# Patient Record
Sex: Male | Born: 1980 | Race: Black or African American | Hispanic: No | Marital: Married | Smoking: Never smoker
Health system: Southern US, Community
[De-identification: ages and names within clinical notes are randomized; demographics above are authoritative.]

---

## 2014-12-13 ENCOUNTER — Ambulatory Visit
Admit: 2014-12-13 | Disposition: A | Discharge status: Home / Self Care | Payer: Self-pay | Attending: Family Medicine | Admitting: Family Medicine

## 2014-12-13 LAB — COMPREHENSIVE METABOLIC PANEL
Albumin: 4.5 g/dL
Alkaline Phosphatase: 51 U/L
Anion Gap: 6 — ABNORMAL LOW (ref 7–16)
BILIRUBIN TOTAL: 0.5 mg/dL
BUN: 12 mg/dL
CO2: 31 mmol/L
Calcium, Total: 9.2 mg/dL
Chloride: 102 mmol/L
Creatinine: 0.96 mg/dL
EGFR (African American): >60
GLUCOSE: 97 mg/dL
POTASSIUM: 4 mmol/L
SGOT(AST): 19 U/L
SGPT (ALT): 32 U/L
Sodium: 139 mmol/L
TOTAL PROTEIN: 7.6 g/dL

## 2014-12-13 LAB — URINALYSIS, COMPLETE
BACTERIA: NEGATIVE
BLOOD: NEGATIVE
Bilirubin,UR: NEGATIVE
Glucose,UR: NEGATIVE
Ketone: NEGATIVE
NITRITE: NEGATIVE
Ph: 7.5 (ref 5.0–8.0)
RBC, UR: NONE SEEN /HPF (ref 0–5)
Specific Gravity: 1.02 (ref 1.000–1.030)

## 2014-12-13 LAB — CBC WITH DIFFERENTIAL/PLATELET
BASOS ABS: 0 10*3/uL (ref 0.0–0.1)
Basophil %: 0.7 %
Eosinophil #: 0.1 10*3/uL (ref 0.0–0.7)
Eosinophil %: 1.2 %
HCT: 45.1 % (ref 40.0–52.0)
HGB: 15.5 g/dL (ref 13.0–18.0)
LYMPHS ABS: 1.8 10*3/uL (ref 1.0–3.6)
Lymphocyte %: 28.5 %
MCH: 31.6 pg (ref 26.0–34.0)
MCHC: 34.3 g/dL (ref 32.0–36.0)
MCV: 92 fL (ref 80–100)
Monocyte #: 0.5 x10 3/mm (ref 0.2–1.0)
Monocyte %: 7.3 %
NEUTROS ABS: 3.9 10*3/uL (ref 1.4–6.5)
Neutrophil %: 62.3 %
PLATELETS: 186 10*3/uL (ref 150–440)
RBC: 4.89 10*6/uL (ref 4.40–5.90)
RDW: 13.1 % (ref 11.5–14.5)
WBC: 6.2 10*3/uL (ref 3.8–10.6)

## 2014-12-13 LAB — LIPASE, BLOOD: Lipase: 26 U/L

## 2014-12-13 LAB — AMYLASE: Amylase: 64 U/L

## 2015-12-09 ENCOUNTER — Encounter: Payer: Self-pay | Admitting: Emergency Medicine

## 2015-12-09 ENCOUNTER — Emergency Department
Admission: EM | Admit: 2015-12-09 | Discharge: 2015-12-09 | Disposition: A | Discharge status: Home / Self Care | Payer: Managed Care, Other (non HMO) | Attending: Emergency Medicine | Admitting: Emergency Medicine

## 2015-12-09 DIAGNOSIS — Y999 Unspecified external cause status: Secondary | ICD-10-CM | POA: Diagnosis not present

## 2015-12-09 DIAGNOSIS — Y929 Unspecified place or not applicable: Secondary | ICD-10-CM | POA: Insufficient documentation

## 2015-12-09 DIAGNOSIS — Y939 Activity, unspecified: Secondary | ICD-10-CM | POA: Diagnosis not present

## 2015-12-09 DIAGNOSIS — M62838 Other muscle spasm: Secondary | ICD-10-CM

## 2015-12-09 DIAGNOSIS — S39012A Strain of muscle, fascia and tendon of lower back, initial encounter: Secondary | ICD-10-CM | POA: Diagnosis not present

## 2015-12-09 DIAGNOSIS — S3992XA Unspecified injury of lower back, initial encounter: Secondary | ICD-10-CM | POA: Diagnosis present

## 2015-12-09 MED ORDER — MELOXICAM 15 MG PO TABS: 15.0000 mg | ORAL_TABLET | Freq: Every day | ORAL | Status: DC

## 2015-12-09 MED ORDER — METHOCARBAMOL 500 MG PO TABS: 500.0000 mg | ORAL_TABLET | Freq: Four times a day (QID) | ORAL | Status: DC
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
## 2015-12-09 NOTE — Discharge Instructions (Signed)

## 2017-02-09 ENCOUNTER — Emergency Department: Payer: Managed Care, Other (non HMO)

## 2017-02-09 ENCOUNTER — Encounter: Payer: Self-pay | Admitting: Emergency Medicine

## 2017-02-09 ENCOUNTER — Emergency Department
Admission: EM | Admit: 2017-02-09 | Discharge: 2017-02-09 | Disposition: A | Discharge status: Home / Self Care | Payer: Managed Care, Other (non HMO) | Attending: Emergency Medicine | Admitting: Emergency Medicine

## 2017-02-09 DIAGNOSIS — M7661 Achilles tendinitis, right leg: Secondary | ICD-10-CM | POA: Diagnosis not present

## 2017-02-09 DIAGNOSIS — Z79899 Other long term (current) drug therapy: Secondary | ICD-10-CM | POA: Diagnosis not present

## 2017-02-09 DIAGNOSIS — M79661 Pain in right lower leg: Secondary | ICD-10-CM

## 2017-02-09 DIAGNOSIS — M7989 Other specified soft tissue disorders: Secondary | ICD-10-CM

## 2017-02-09 DIAGNOSIS — M79662 Pain in left lower leg: Secondary | ICD-10-CM

## 2017-02-09 DIAGNOSIS — M79604 Pain in right leg: Secondary | ICD-10-CM | POA: Diagnosis present

## 2017-02-09 MED ORDER — KETOROLAC TROMETHAMINE 10 MG PO TABS: 10.0000 mg | ORAL_TABLET | Freq: Four times a day (QID) | ORAL | 0 refills | Status: DC | PRN

## 2017-02-09 NOTE — ED Provider Notes (Signed)
[facility] Emergency Department Provider Note  ____________________________________________  Time seen: Approximately 12:54 PM  I have reviewed the triage vital signs and the nursing notes.   HISTORY  Chief Complaint Leg Pain    HPI Tyrone Mckinney is a 36 y.o. male that presents to the emergency department with calf pain after an injury 2 weeks ago. Pain radiates from his ankle to his calf. He describes the pain as throbbing.  Pain was getting better but then worsened after he walked on it all day at work yesterday. This morning he thinks that he "re-tweaked" his ankle walking up the stairs. Patient was seen at an outside ED 2 weeks ago and had xrays completed and was told that he pulled something. He has been wearing a walking cast. He has taken tramadol and naproxen for pain. He denies fever, SOB, CP, nausea, vomiting, abdominal pain, numbness, tingling.    History reviewed. No pertinent past medical history.  There are no active problems to display for this patient.   No past surgical history on file.  Prior to Admission medications   Medication Sig Start Date End Date Taking? Authorizing Provider  ketorolac (TORADOL) 10 MG tablet Take 1 tablet (10 mg total) by mouth every 6 (six) hours as needed. 02/09/17   Enid Derry, PA-C  meloxicam (MOBIC) 15 MG tablet Take 1 tablet (15 mg total) by mouth daily. 12/09/15   Cuthriell, Delorise Royals, PA-C  methocarbamol (ROBAXIN) 500 MG tablet Take 1 tablet (500 mg total) by mouth 4 (four) times daily. 12/09/15   Cuthriell, Delorise Royals, PA-C    Allergies Patient has no known allergies.  No family history on file.  Social History Social History  Substance Use Topics  . Smoking status: Never Smoker  . Smokeless tobacco: Not on file  . Alcohol use No     Review of Systems  Constitutional: No fever/chills Cardiovascular: No chest pain. Respiratory: No SOB. Gastrointestinal: No abdominal pain.  No nausea,  no vomiting.  Musculoskeletal: Positive for lower right leg pain.  Skin: Negative for rash, abrasions, lacerations, ecchymosis. Neurological: Negative for headaches, numbness or tingling   ____________________________________________   PHYSICAL EXAM:  VITAL SIGNS: ED Triage Vitals  Enc Vitals Group     BP 02/09/17 1144 130/82     Pulse Rate 02/09/17 1144 69     Resp 02/09/17 1144 15     Temp 02/09/17 1144 98.2 F (36.8 C)     Temp Source 02/09/17 1144 Oral     SpO2 02/09/17 1144 97 %     Weight --      Height --      Head Circumference --      Peak Flow --      Pain Score 02/09/17 1132 8     Pain Loc --      Pain Edu? --      Excl. in GC? --      Constitutional: Alert and oriented. Well appearing and in no acute distress. Eyes: Conjunctivae are normal. PERRL. EOMI. Head: Atraumatic. ENT:      Ears:      Nose: No congestion/rhinnorhea.      Mouth/Throat: Mucous membranes are moist.  Neck: No stridor.  Cardiovascular: Normal rate, regular rhythm.  Good peripheral circulation. 2+ dorsalis pedis pulses. Respiratory: Normal respiratory effort without tachypnea or retractions. Lungs CTAB. Good air entry to the bases with no decreased or absent breath sounds. Gastrointestinal: Bowel sounds 4 quadrants. Soft and nontender to palpation.  No guarding or rigidity. No palpable masses. No distention.  Musculoskeletal: Full range of motion to all extremities. No gross deformities appreciated. Tenderness to palpation of right calf. Tenderness to palpation over her Achilles tendon. Negative Thompson test.  Neurologic:  Normal speech and language. No gross focal neurologic deficits are appreciated.  Skin:  Skin is warm, dry and intact. No rash noted. Psychiatric: Mood and affect are normal. Speech and behavior are normal.    ____________________________________________   LABS (all labs ordered are listed, but only abnormal results are displayed)  Labs Reviewed - No data to  display ____________________________________________  EKG   ____________________________________________  RADIOLOGY  [place] Venous Img Lower Unilateral Right  Result Date: 02/09/2017 CLINICAL DATA:  Right-sided pain EXAM: RIGHT LOWER EXTREMITY VENOUS DOPPLER ULTRASOUND TECHNIQUE: Gray-scale sonography with graded compression, as well as color Doppler and duplex ultrasound were performed to evaluate the lower extremity deep venous systems from the level of the common femoral vein and including the common femoral, femoral, profunda femoral, popliteal and calf veins including the posterior tibial, peroneal and gastrocnemius veins when visible. The superficial great saphenous vein was also interrogated. Spectral Doppler was utilized to evaluate flow at rest and with distal augmentation maneuvers in the common femoral, femoral and popliteal veins. COMPARISON:  None. FINDINGS: Contralateral Common Femoral Vein: Respiratory phasicity is normal and symmetric with the symptomatic side. No evidence of thrombus. Normal compressibility. Common Femoral Vein: No evidence of thrombus. Normal compressibility, respiratory phasicity and response to augmentation. Saphenofemoral Junction: No evidence of thrombus. Normal compressibility and flow on color Doppler imaging. Profunda Femoral Vein: No evidence of thrombus. Normal compressibility and flow on color Doppler imaging. Femoral Vein: No evidence of thrombus. Normal compressibility, respiratory phasicity and response to augmentation. Popliteal Vein: No evidence of thrombus. Normal compressibility, respiratory phasicity and response to augmentation. Calf Veins: No evidence of thrombus. Normal compressibility and flow on color Doppler imaging. Superficial Great Saphenous Vein: No evidence of thrombus. Normal compressibility and flow on color Doppler imaging. Venous Reflux:  None. Other Findings:  None. IMPRESSION: No evidence of DVT within the right lower extremity.  Electronically Signed   By: Gerome Samavid  Williams III M.D   On: 02/09/2017 14:11    ____________________________________________    PROCEDURES  Procedure(s) performed:    Procedures    Medications - No data to display   ____________________________________________   INITIAL IMPRESSION / ASSESSMENT AND PLAN / ED COURSE  Pertinent labs & imaging results that were available during my care of the patient were reviewed by me and considered in my medical decision making (see chart for details).  Review of the [place] CSRS was performed in accordance of the [facility] prior to dispensing any controlled drugs.   Patient presented to the emergency department with right calf and ankle pain for 2 weeks. Vital signs and exam are reassuring. Ultrasound negative for DVT. Patient had an x-ray completed at an outside ED 2 weeks ago, which was negative for any bony abnormalities. Pain was improving until he stepped on it different going up the steps today. He has tenderness to palpation over the Achilles tendon. Negative Thompson test. Patient will be discharged home with prescriptions for Toradol. Patient is to follow up with orthopedics as directed. Patient is given ED precautions to return to the ED for any worsening or new symptoms.     ____________________________________________  FINAL CLINICAL IMPRESSION(S) / ED DIAGNOSES  Final diagnoses:  Achilles tendinitis of right lower extremity      NEW MEDICATIONS STARTED  DURING THIS VISIT:  Discharge Medication List as of 02/09/2017  2:36 PM    START taking these medications   Details  ketorolac (TORADOL) 10 MG tablet Take 1 tablet (10 mg total) by mouth every 6 (six) hours as needed., Starting Thu 02/09/2017, Print            This chart was dictated using voice recognition software/Dragon. Despite best efforts to proofread, errors can occur which can change the meaning. Any change was purely unintentional.    Enid Derry, PA-C 02/09/17  1535    Don Perking, [place], MD 02/10/17 [phone]

## 2017-02-09 NOTE — ED Triage Notes (Signed)
Pt reports he was walking down the stairs today and twisted his right leg, reports posterior leg pain, pt ambulatory to desk with limp.

## 2017-03-21 ENCOUNTER — Encounter: Payer: Self-pay | Admitting: Podiatry

## 2017-03-21 ENCOUNTER — Ambulatory Visit (INDEPENDENT_AMBULATORY_CARE_PROVIDER_SITE_OTHER): Payer: Managed Care, Other (non HMO)

## 2017-03-21 ENCOUNTER — Ambulatory Visit (INDEPENDENT_AMBULATORY_CARE_PROVIDER_SITE_OTHER): Payer: Managed Care, Other (non HMO) | Admitting: Podiatry

## 2017-03-21 ENCOUNTER — Other Ambulatory Visit: Payer: Self-pay

## 2017-03-21 DIAGNOSIS — M722 Plantar fascial fibromatosis: Secondary | ICD-10-CM

## 2017-03-21 DIAGNOSIS — M79671 Pain in right foot: Secondary | ICD-10-CM | POA: Diagnosis not present

## 2017-03-21 MED ORDER — DICLOFENAC SODIUM 75 MG PO TBEC: 75.0000 mg | DELAYED_RELEASE_TABLET | Freq: Two times a day (BID) | ORAL | 1 refills | Status: DC

## 2017-03-21 NOTE — Progress Notes (Signed)
   Subjective:    Patient ID: Tyrone Mckinney, male    DOB: 11/28/1980, 36 y.o.   MRN: [id number]  HPI  Chief Complaint  Patient presents with  . Foot Pain    right, bottom and back of heel x 2 months. Pt stated "hurt foot playing basketball"        Review of Systems  All other systems reviewed and are negative.      Objective:   Physical Exam        Assessment & Plan:

## 2017-03-25 MED ORDER — BETAMETHASONE SOD PHOS & ACET 6 (3-3) MG/ML IJ SUSP: 3.0000 mg | Freq: Once | INTRAMUSCULAR | Status: DC

## 2017-03-25 NOTE — Progress Notes (Signed)
Patient ID: Tyrone Mckinney, male   DOB: 03/05/1981, 36 y.o.   MRN: [id number]   HPI: Patient presents today for evaluation of pain to the posterior and plantar aspect of the right heel. This is been going on for approximately 2 months. Patient states that he hurt his foot approximately 2 months ago while playing basketball. He came down hard on the heel and has experienced pain and tenderness ever since.   Physical Exam: General: The patient is alert and oriented x3 in no acute distress.  Dermatology: Skin is warm, dry and supple bilateral lower extremities. Negative for open lesions or macerations.  Vascular: Palpable pedal pulses bilaterally. No edema or erythema noted. Capillary refill within normal limits.  Neurological: Epicritic and protective threshold grossly intact bilaterally.   Musculoskeletal Exam: Range of motion within normal limits to all pedal and ankle joints bilateral. Muscle strength 5/5 in all groups bilateral. Negative pain for lateral compression of the posterior tubercle of the calcaneus. There is also some pain on palpation to the plantar heel consistent with a likely contributory plantar fasciitis  Radiographic Exam:  Normal osseous mineralization. Joint spaces preserved. No fracture/dislocation/boney destruction.    Assessment: 1. Right calcaneal bone contusion plantar heel 2. Plantar fasciitis right   Plan of Care:  1. Patient was evaluated. X-rays reviewed today 2. Injection of 0.5 mL Celestone Soluspan injected in the plantar aspect of the right heel 3. Plantar fascial brace dispensed today to offload heel pressure 4. Prescription for diclofenac 75 mg 5. A note for work was provided to refrain from work 4 weeks 6. Return to clinic in 4 weeks   Felecia ShellingBrent M. Daneisha Surges, DPM [facility]  Dr. Felecia ShellingBrent M. Cecelia Graciano, DPM    [address]                                        [place], [place] [id number]                Office [phone]  Fax [phone]  [phone]

## 2017-04-18 ENCOUNTER — Ambulatory Visit: Payer: Managed Care, Other (non HMO) | Admitting: Podiatry

## 2019-02-25 IMAGING — US US EXTREM LOW VENOUS*R*
1 series · 13 of 24 positions shown · non-contrast
Comparison: None.

CLINICAL DATA: Right-sided pain



[Series 1: us extrem low venous*right* · 0.07mm/px · 13 of 35 slices shown]
[im 1/35]
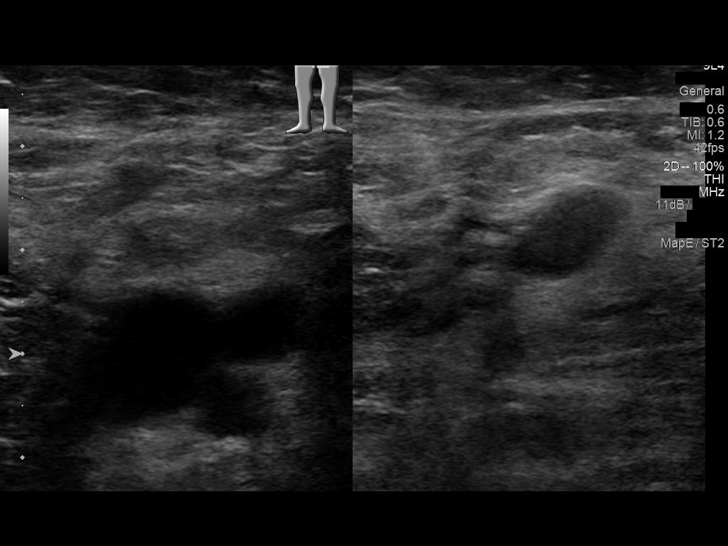
[im 3/35]
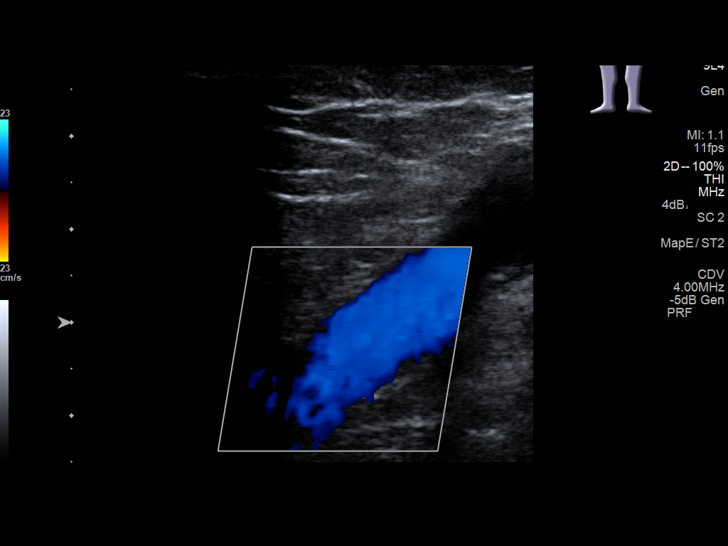
[im 6/35]
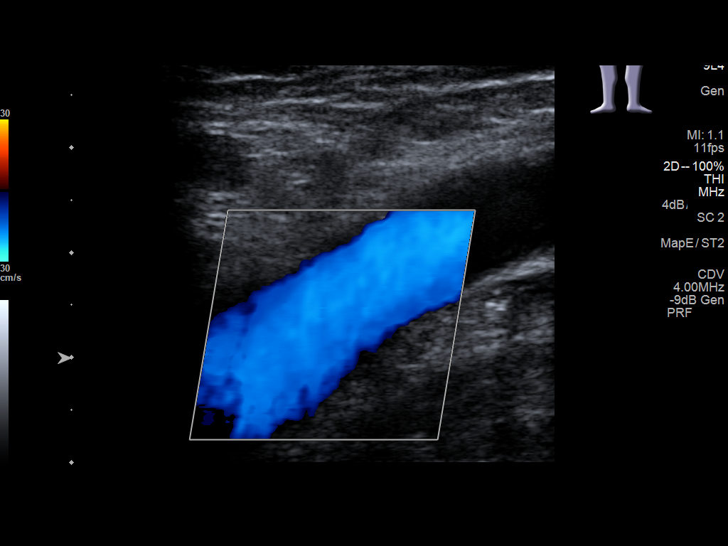
[im 9/35]
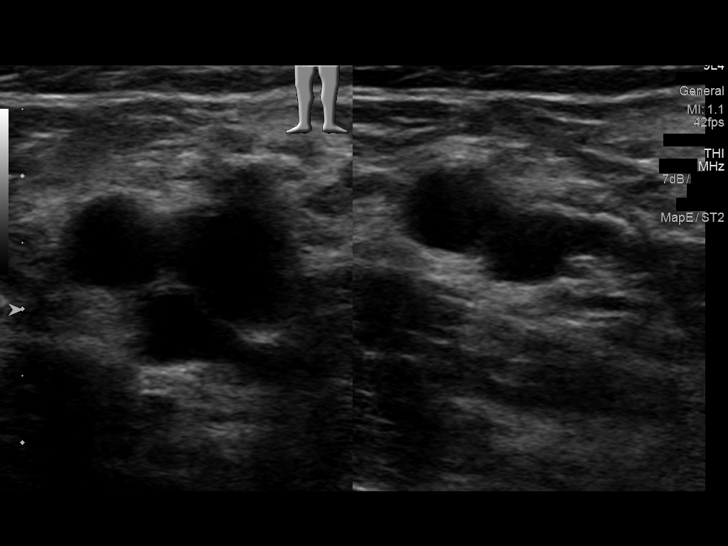
[im 12/35]
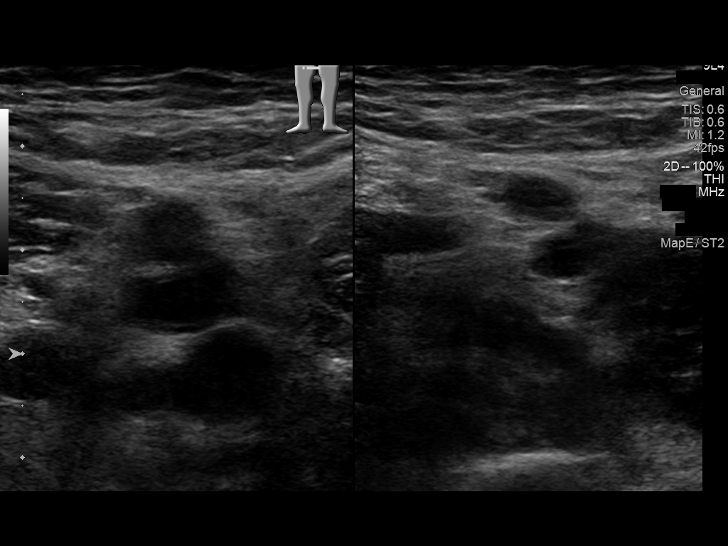
[im 15/35]
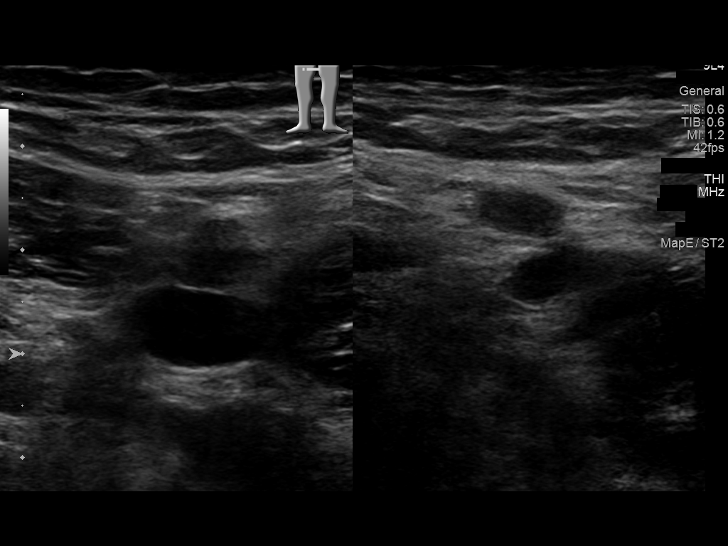
[im 18/35]
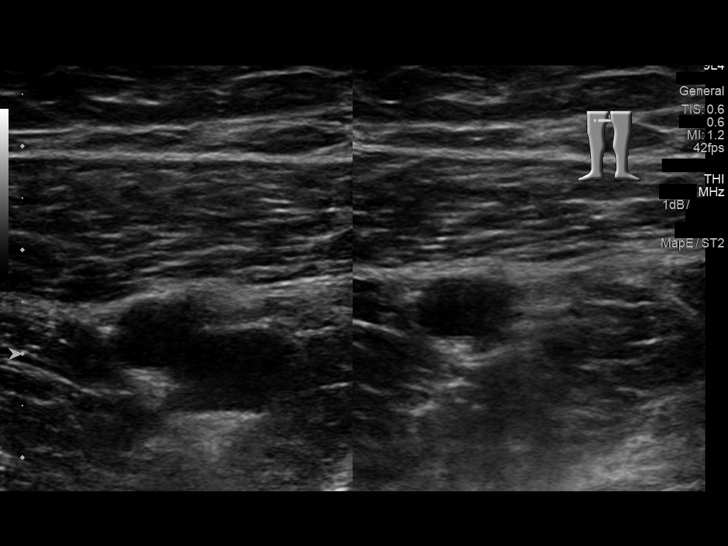
[im 20/35]
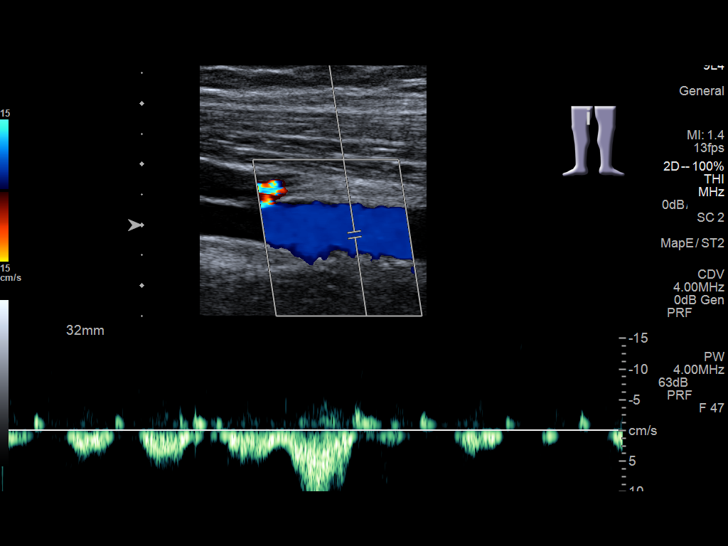
[im 23/35]
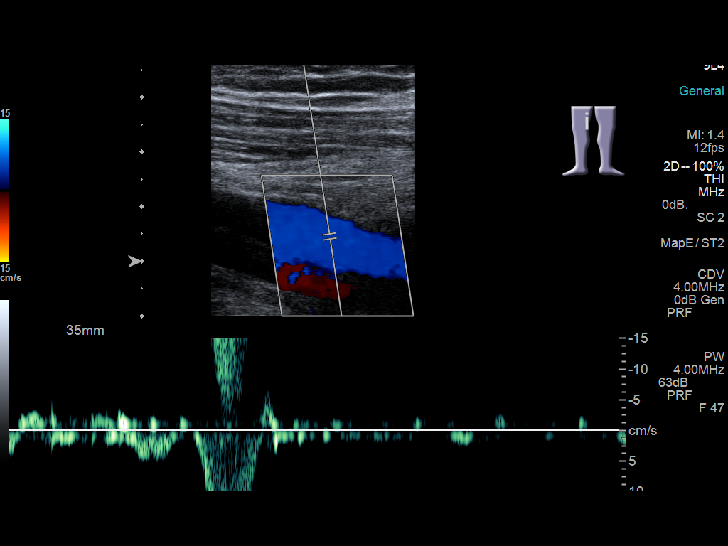
[im 26/35]
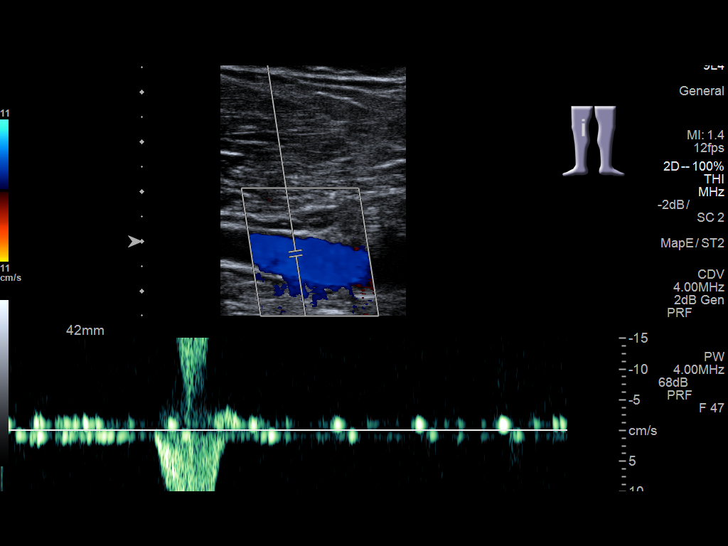
[im 29/35]
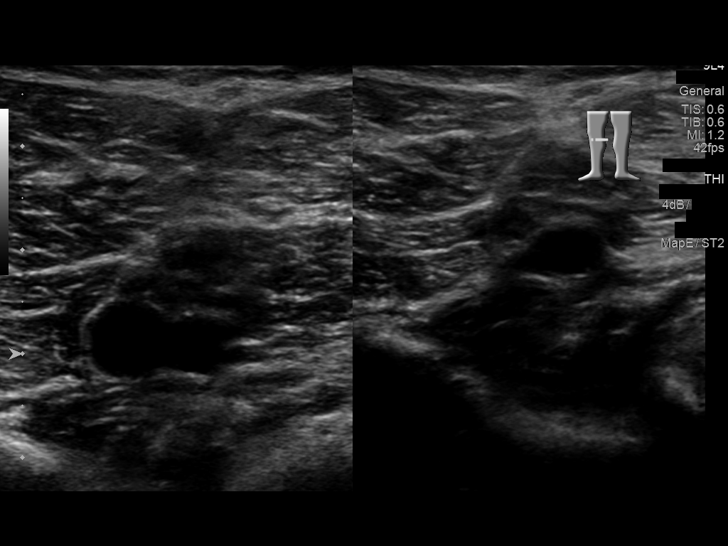
[im 32/35]
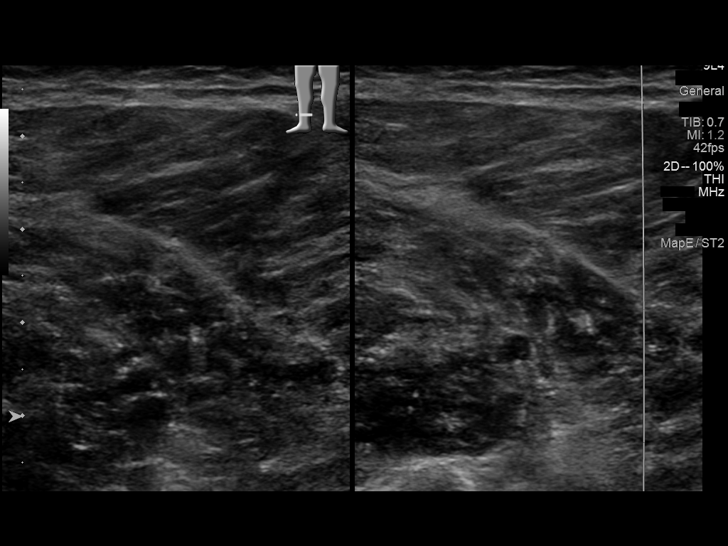
[im 35/35]
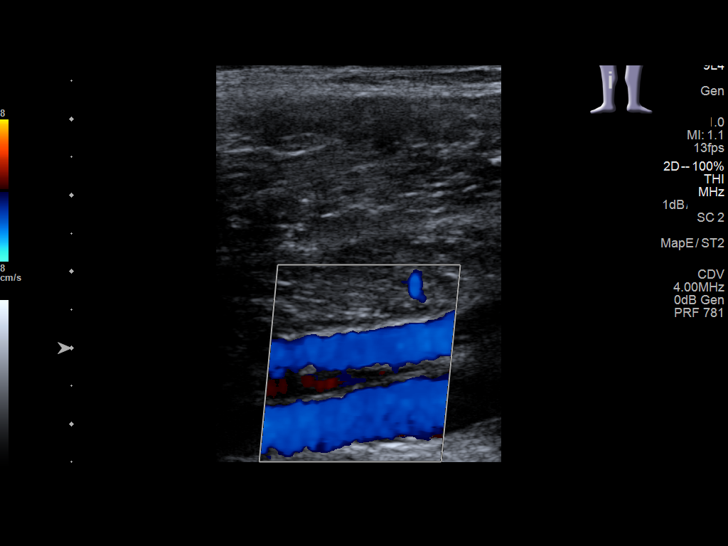

[13 of 24 positions shown; findings below may reference images not displayed]

FINDINGS: Contralateral Common Femoral Vein: Respiratory phasicity is normal
and symmetric with the symptomatic side. No evidence of thrombus.
Normal compressibility.

Common Femoral Vein: No evidence of thrombus. Normal
compressibility, respiratory phasicity and response to augmentation.

Saphenofemoral Junction: No evidence of thrombus. Normal
compressibility and flow on color Doppler imaging.

Profunda Femoral Vein: No evidence of thrombus. Normal
compressibility and flow on color Doppler imaging.

Femoral Vein: No evidence of thrombus. Normal compressibility,
respiratory phasicity and response to augmentation.

Popliteal Vein: No evidence of thrombus. Normal compressibility,
respiratory phasicity and response to augmentation.

Calf Veins: No evidence of thrombus. Normal compressibility and flow
on color Doppler imaging.

Superficial Great Saphenous Vein: No evidence of thrombus. Normal
compressibility and flow on color Doppler imaging.

Venous Reflux:  None.

Other Findings:  None.
IMPRESSION: No evidence of DVT within the right lower extremity.

## 2024-03-28 ENCOUNTER — Inpatient Hospital Stay (HOSPITAL_COMMUNITY): Payer: MEDICAID

## 2024-03-28 ENCOUNTER — Inpatient Hospital Stay (HOSPITAL_COMMUNITY): Payer: Self-pay

## 2024-03-28 ENCOUNTER — Other Ambulatory Visit: Payer: Self-pay

## 2024-03-28 ENCOUNTER — Emergency Department (HOSPITAL_COMMUNITY): Payer: Self-pay

## 2024-03-28 ENCOUNTER — Inpatient Hospital Stay (HOSPITAL_COMMUNITY): Payer: Self-pay | Admitting: Critical Care Medicine

## 2024-03-28 ENCOUNTER — Emergency Department (HOSPITAL_COMMUNITY): Payer: MEDICAID

## 2024-03-28 ENCOUNTER — Inpatient Hospital Stay (HOSPITAL_COMMUNITY)
Admission: EM | Admit: 2024-03-28 | Discharge: 2024-04-12 | DRG: 957 | Disposition: A | Discharge status: Rehabilitation Facility | Payer: MEDICAID | Attending: General Surgery | Admitting: General Surgery

## 2024-03-28 ENCOUNTER — Inpatient Hospital Stay (HOSPITAL_COMMUNITY): Admission: EM | Disposition: A | Discharge status: Rehabilitation Facility | Payer: Self-pay | Source: Home / Self Care

## 2024-03-28 DIAGNOSIS — S140XXS Concussion and edema of cervical spinal cord, sequela: Secondary | ICD-10-CM | POA: Diagnosis not present

## 2024-03-28 DIAGNOSIS — S14154A Other incomplete lesion at C4 level of cervical spinal cord, initial encounter: Principal | ICD-10-CM | POA: Diagnosis present

## 2024-03-28 DIAGNOSIS — S14104A Unspecified injury at C4 level of cervical spinal cord, initial encounter: Secondary | ICD-10-CM | POA: Diagnosis present

## 2024-03-28 DIAGNOSIS — T794XXA Traumatic shock, initial encounter: Secondary | ICD-10-CM | POA: Diagnosis not present

## 2024-03-28 DIAGNOSIS — N319 Neuromuscular dysfunction of bladder, unspecified: Secondary | ICD-10-CM | POA: Diagnosis present

## 2024-03-28 DIAGNOSIS — F430 Acute stress reaction: Secondary | ICD-10-CM | POA: Diagnosis not present

## 2024-03-28 DIAGNOSIS — G8250 Quadriplegia, unspecified: Secondary | ICD-10-CM | POA: Diagnosis present

## 2024-03-28 DIAGNOSIS — S22050A Wedge compression fracture of T5-T6 vertebra, initial encounter for closed fracture: Secondary | ICD-10-CM | POA: Diagnosis present

## 2024-03-28 DIAGNOSIS — J9690 Respiratory failure, unspecified, unspecified whether with hypoxia or hypercapnia: Secondary | ICD-10-CM | POA: Diagnosis present

## 2024-03-28 DIAGNOSIS — I4891 Unspecified atrial fibrillation: Secondary | ICD-10-CM | POA: Diagnosis present

## 2024-03-28 DIAGNOSIS — F1310 Sedative, hypnotic or anxiolytic abuse, uncomplicated: Secondary | ICD-10-CM | POA: Diagnosis present

## 2024-03-28 DIAGNOSIS — M4834 Traumatic spondylopathy, thoracic region: Secondary | ICD-10-CM | POA: Diagnosis present

## 2024-03-28 DIAGNOSIS — G9519 Other vascular myelopathies: Secondary | ICD-10-CM | POA: Diagnosis present

## 2024-03-28 DIAGNOSIS — S22070A Wedge compression fracture of T9-T10 vertebra, initial encounter for closed fracture: Secondary | ICD-10-CM | POA: Diagnosis present

## 2024-03-28 DIAGNOSIS — R451 Restlessness and agitation: Secondary | ICD-10-CM | POA: Diagnosis present

## 2024-03-28 DIAGNOSIS — D62 Acute posthemorrhagic anemia: Secondary | ICD-10-CM | POA: Diagnosis present

## 2024-03-28 DIAGNOSIS — Y92410 Unspecified street and highway as the place of occurrence of the external cause: Secondary | ICD-10-CM

## 2024-03-28 DIAGNOSIS — S140XXD Concussion and edema of cervical spinal cord, subsequent encounter: Secondary | ICD-10-CM | POA: Diagnosis not present

## 2024-03-28 DIAGNOSIS — F32A Depression, unspecified: Secondary | ICD-10-CM | POA: Diagnosis present

## 2024-03-28 DIAGNOSIS — S22060A Wedge compression fracture of T7-T8 vertebra, initial encounter for closed fracture: Secondary | ICD-10-CM | POA: Diagnosis present

## 2024-03-28 DIAGNOSIS — M4802 Spinal stenosis, cervical region: Secondary | ICD-10-CM | POA: Diagnosis present

## 2024-03-28 DIAGNOSIS — S0101XA Laceration without foreign body of scalp, initial encounter: Secondary | ICD-10-CM | POA: Diagnosis present

## 2024-03-28 DIAGNOSIS — S0682AA Injury of left internal carotid artery, intracranial portion, not elsewhere classified with loss of consciousness status unknown, initial encounter: Secondary | ICD-10-CM | POA: Diagnosis present

## 2024-03-28 DIAGNOSIS — S27322A Contusion of lung, bilateral, initial encounter: Secondary | ICD-10-CM | POA: Diagnosis present

## 2024-03-28 DIAGNOSIS — K592 Neurogenic bowel, not elsewhere classified: Secondary | ICD-10-CM | POA: Diagnosis present

## 2024-03-28 DIAGNOSIS — S52612A Displaced fracture of left ulna styloid process, initial encounter for closed fracture: Secondary | ICD-10-CM | POA: Diagnosis present

## 2024-03-28 DIAGNOSIS — M5021 Other cervical disc displacement,  high cervical region: Secondary | ICD-10-CM | POA: Diagnosis present

## 2024-03-28 DIAGNOSIS — S27331A Laceration of lung, unilateral, initial encounter: Secondary | ICD-10-CM | POA: Diagnosis present

## 2024-03-28 DIAGNOSIS — Z79899 Other long term (current) drug therapy: Secondary | ICD-10-CM

## 2024-03-28 DIAGNOSIS — N4830 Priapism, unspecified: Secondary | ICD-10-CM | POA: Diagnosis present

## 2024-03-28 DIAGNOSIS — T07XXXA Unspecified multiple injuries, initial encounter: Secondary | ICD-10-CM | POA: Diagnosis present

## 2024-03-28 DIAGNOSIS — M4801 Spinal stenosis, occipito-atlanto-axial region: Secondary | ICD-10-CM | POA: Diagnosis present

## 2024-03-28 DIAGNOSIS — K30 Functional dyspepsia: Secondary | ICD-10-CM | POA: Diagnosis not present

## 2024-03-28 DIAGNOSIS — F1210 Cannabis abuse, uncomplicated: Secondary | ICD-10-CM | POA: Diagnosis present

## 2024-03-28 DIAGNOSIS — R9389 Abnormal findings on diagnostic imaging of other specified body structures: Secondary | ICD-10-CM

## 2024-03-28 DIAGNOSIS — F1410 Cocaine abuse, uncomplicated: Secondary | ICD-10-CM | POA: Diagnosis present

## 2024-03-28 DIAGNOSIS — Z7982 Long term (current) use of aspirin: Secondary | ICD-10-CM

## 2024-03-28 DIAGNOSIS — I493 Ventricular premature depolarization: Secondary | ICD-10-CM | POA: Diagnosis present

## 2024-03-28 DIAGNOSIS — Z560 Unemployment, unspecified: Secondary | ICD-10-CM

## 2024-03-28 DIAGNOSIS — S0681AA Injury of right internal carotid artery, intracranial portion, not elsewhere classified with loss of consciousness status unknown, initial encounter: Secondary | ICD-10-CM | POA: Diagnosis present

## 2024-03-28 DIAGNOSIS — Z5971 Insufficient health insurance coverage: Secondary | ICD-10-CM

## 2024-03-28 DIAGNOSIS — I951 Orthostatic hypotension: Secondary | ICD-10-CM | POA: Diagnosis not present

## 2024-03-28 DIAGNOSIS — Z23 Encounter for immunization: Secondary | ICD-10-CM | POA: Diagnosis not present

## 2024-03-28 DIAGNOSIS — S61532A Puncture wound without foreign body of left wrist, initial encounter: Secondary | ICD-10-CM | POA: Diagnosis present

## 2024-03-28 DIAGNOSIS — E882 Lipomatosis, not elsewhere classified: Secondary | ICD-10-CM | POA: Diagnosis present

## 2024-03-28 DIAGNOSIS — I358 Other nonrheumatic aortic valve disorders: Secondary | ICD-10-CM | POA: Diagnosis present

## 2024-03-28 DIAGNOSIS — I9581 Postprocedural hypotension: Secondary | ICD-10-CM | POA: Diagnosis not present

## 2024-03-28 DIAGNOSIS — M4804 Spinal stenosis, thoracic region: Secondary | ICD-10-CM | POA: Diagnosis present

## 2024-03-28 DIAGNOSIS — S0181XA Laceration without foreign body of other part of head, initial encounter: Secondary | ICD-10-CM | POA: Diagnosis present

## 2024-03-28 DIAGNOSIS — R402412 Glasgow coma scale score 13-15, at arrival to emergency department: Secondary | ICD-10-CM | POA: Diagnosis present

## 2024-03-28 DIAGNOSIS — Z818 Family history of other mental and behavioral disorders: Secondary | ICD-10-CM

## 2024-03-28 DIAGNOSIS — M50222 Other cervical disc displacement at C5-C6 level: Secondary | ICD-10-CM | POA: Diagnosis present

## 2024-03-28 HISTORY — PX: POSTERIOR CERVICAL FUSION/FORAMINOTOMY: SHX5038

## 2024-03-28 HISTORY — PX: SCALP LACERATION REPAIR: SHX6089

## 2024-03-28 LAB — TYPE AND SCREEN
ABO/RH(D): A POS
Antibody Screen: NEGATIVE

## 2024-03-28 LAB — I-STAT CHEM 8, ED
BUN: 17 mg/dL (ref 6–20)
Calcium, Ion: 1.04 mmol/L — ABNORMAL LOW (ref 1.15–1.40)
Chloride: 105 mmol/L (ref 98–111)
Creatinine, Ser: 0.9 mg/dL (ref 0.61–1.24)
Glucose, Bld: 119 mg/dL — ABNORMAL HIGH (ref 70–99)
HCT: 39 % (ref 39.0–52.0)
Hemoglobin: 13.3 g/dL (ref 13.0–17.0)
Potassium: 3.8 mmol/L (ref 3.5–5.1)
Sodium: 139 mmol/L (ref 135–145)
TCO2: 24 mmol/L (ref 22–32)

## 2024-03-28 LAB — ABO/RH: ABO/RH(D): A POS

## 2024-03-28 LAB — URINALYSIS, ROUTINE W REFLEX MICROSCOPIC
Bilirubin Urine: NEGATIVE
Bilirubin Urine: NEGATIVE
Glucose, UA: NEGATIVE mg/dL
Glucose, UA: 50 mg/dL — AB
Hgb urine dipstick: NEGATIVE
Ketones, ur: NEGATIVE mg/dL
Ketones, ur: NEGATIVE mg/dL
Leukocytes,Ua: NEGATIVE
Nitrite: NEGATIVE
Nitrite: NEGATIVE
Protein, ur: 30 mg/dL — AB
Protein, ur: NEGATIVE mg/dL
Specific Gravity, Urine: >1.046 — ABNORMAL HIGH (ref 1.005–1.030)
Specific Gravity, Urine: 1.013 (ref 1.005–1.030)
pH: 5 (ref 5.0–8.0)
pH: 5 (ref 5.0–8.0)

## 2024-03-28 LAB — BASIC METABOLIC PANEL WITH GFR
Anion gap: 7 (ref 5–15)
BUN: 13 mg/dL (ref 6–20)
CO2: 23 mmol/L (ref 22–32)
Calcium: 7.8 mg/dL — ABNORMAL LOW (ref 8.9–10.3)
Chloride: 109 mmol/L (ref 98–111)
Creatinine, Ser: 0.95 mg/dL (ref 0.61–1.24)
GFR, Estimated: >60 mL/min (ref >60)
Glucose, Bld: 145 mg/dL — ABNORMAL HIGH (ref 70–99)
Potassium: 4.2 mmol/L (ref 3.5–5.1)
Sodium: 139 mmol/L (ref 135–145)

## 2024-03-28 LAB — CBC
HCT: 39.7 % (ref 39.0–52.0)
HCT: 34 % — ABNORMAL LOW (ref 39.0–52.0)
HCT: 32.6 % — ABNORMAL LOW (ref 39.0–52.0)
Hemoglobin: 13.7 g/dL (ref 13.0–17.0)
Hemoglobin: 12.1 g/dL — ABNORMAL LOW (ref 13.0–17.0)
Hemoglobin: 11.3 g/dL — ABNORMAL LOW (ref 13.0–17.0)
MCH: 31.9 pg (ref 26.0–34.0)
MCH: 32.4 pg (ref 26.0–34.0)
MCH: 32.3 pg (ref 26.0–34.0)
MCHC: 34.5 g/dL (ref 30.0–36.0)
MCHC: 35.6 g/dL (ref 30.0–36.0)
MCHC: 34.7 g/dL (ref 30.0–36.0)
MCV: 92.5 fL (ref 80.0–100.0)
MCV: 91.2 fL (ref 80.0–100.0)
MCV: 93.1 fL (ref 80.0–100.0)
Platelets: 201 K/uL (ref 150–400)
Platelets: 201 K/uL (ref 150–400)
Platelets: 197 K/uL (ref 150–400)
RBC: 4.29 MIL/uL (ref 4.22–5.81)
RBC: 3.73 MIL/uL — ABNORMAL LOW (ref 4.22–5.81)
RBC: 3.5 MIL/uL — ABNORMAL LOW (ref 4.22–5.81)
RDW: 13.2 % (ref 11.5–15.5)
RDW: 13 % (ref 11.5–15.5)
RDW: 13.1 % (ref 11.5–15.5)
WBC: 7 K/uL (ref 4.0–10.5)
WBC: 10.7 K/uL — ABNORMAL HIGH (ref 4.0–10.5)
WBC: 13.1 K/uL — ABNORMAL HIGH (ref 4.0–10.5)
nRBC: 0 % (ref 0.0–0.2)
nRBC: 0 % (ref 0.0–0.2)
nRBC: 0 % (ref 0.0–0.2)

## 2024-03-28 LAB — COMPREHENSIVE METABOLIC PANEL WITH GFR
ALT: 33 U/L (ref 0–44)
AST: 36 U/L (ref 15–41)
Albumin: 3.5 g/dL (ref 3.5–5.0)
Alkaline Phosphatase: 46 U/L (ref 38–126)
Anion gap: 11 (ref 5–15)
BUN: 14 mg/dL (ref 6–20)
CO2: 22 mmol/L (ref 22–32)
Calcium: 8.6 mg/dL — ABNORMAL LOW (ref 8.9–10.3)
Chloride: 104 mmol/L (ref 98–111)
Creatinine, Ser: 0.97 mg/dL (ref 0.61–1.24)
GFR, Estimated: >60 mL/min (ref >60)
Glucose, Bld: 117 mg/dL — ABNORMAL HIGH (ref 70–99)
Potassium: 3.7 mmol/L (ref 3.5–5.1)
Sodium: 137 mmol/L (ref 135–145)
Total Bilirubin: 0.6 mg/dL (ref 0.0–1.2)
Total Protein: 6.1 g/dL — ABNORMAL LOW (ref 6.5–8.1)

## 2024-03-28 LAB — PROTIME-INR
INR: 0.9 (ref 0.8–1.2)
Prothrombin Time: 13.1 s (ref 11.4–15.2)

## 2024-03-28 LAB — TROPONIN I (HIGH SENSITIVITY)
Troponin I (High Sensitivity): 5 ng/L (ref <18)
Troponin I (High Sensitivity): 4 ng/L (ref <18)

## 2024-03-28 LAB — POCT I-STAT 7, (LYTES, BLD GAS, ICA,H+H)
Acid-base deficit: 3 mmol/L — ABNORMAL HIGH (ref 0.0–2.0)
Bicarbonate: 23.3 mmol/L (ref 20.0–28.0)
Calcium, Ion: 1.15 mmol/L (ref 1.15–1.40)
HCT: 31 % — ABNORMAL LOW (ref 39.0–52.0)
Hemoglobin: 10.5 g/dL — ABNORMAL LOW (ref 13.0–17.0)
O2 Saturation: 100 %
Potassium: 4.6 mmol/L (ref 3.5–5.1)
Sodium: 144 mmol/L (ref 135–145)
TCO2: 25 mmol/L (ref 22–32)
pCO2 arterial: 45.7 mmHg (ref 32–48)
pH, Arterial: 7.315 — ABNORMAL LOW (ref 7.35–7.45)
pO2, Arterial: 289 mmHg — ABNORMAL HIGH (ref 83–108)

## 2024-03-28 LAB — RAPID URINE DRUG SCREEN, HOSP PERFORMED
Amphetamines: NOT DETECTED
Barbiturates: NOT DETECTED
Benzodiazepines: POSITIVE — AB
Cocaine: POSITIVE — AB
Opiates: NOT DETECTED
Tetrahydrocannabinol: POSITIVE — AB

## 2024-03-28 LAB — I-STAT ARTERIAL BLOOD GAS, ED
Acid-base deficit: 2 mmol/L (ref 0.0–2.0)
Bicarbonate: 23.9 mmol/L (ref 20.0–28.0)
Calcium, Ion: 1.2 mmol/L (ref 1.15–1.40)
HCT: 30 % — ABNORMAL LOW (ref 39.0–52.0)
Hemoglobin: 10.2 g/dL — ABNORMAL LOW (ref 13.0–17.0)
O2 Saturation: 100 %
Patient temperature: 98.6
Potassium: 3.9 mmol/L (ref 3.5–5.1)
Sodium: 140 mmol/L (ref 135–145)
TCO2: 25 mmol/L (ref 22–32)
pCO2 arterial: 43 mmHg (ref 32–48)
pH, Arterial: 7.353 (ref 7.35–7.45)
pO2, Arterial: 306 mmHg — ABNORMAL HIGH (ref 83–108)

## 2024-03-28 LAB — I-STAT CG4 LACTIC ACID, ED: Lactic Acid, Venous: 1.6 mmol/L (ref 0.5–1.9)

## 2024-03-28 LAB — MAGNESIUM: Magnesium: 1.6 mg/dL — ABNORMAL LOW (ref 1.7–2.4)

## 2024-03-28 LAB — SAMPLE TO BLOOD BANK

## 2024-03-28 LAB — ETHANOL: Alcohol, Ethyl (B): <15 mg/dL (ref <15)

## 2024-03-28 LAB — PHOSPHORUS: Phosphorus: 3.3 mg/dL (ref 2.5–4.6)

## 2024-03-28 LAB — HIV ANTIBODY (ROUTINE TESTING W REFLEX): HIV Screen 4th Generation wRfx: NONREACTIVE

## 2024-03-28 SURGERY — POSTERIOR CERVICAL FUSION/FORAMINOTOMY LEVEL 5
Anesthesia: General

## 2024-03-28 MED ORDER — SUCCINYLCHOLINE CHLORIDE 20 MG/ML IJ SOLN
INTRAMUSCULAR | Status: AC | PRN
  Administered 2024-03-28: 120 mg via INTRAVENOUS

## 2024-03-28 MED ORDER — OXYCODONE HCL 5 MG PO TABS
5.0000 mg | ORAL_TABLET | ORAL | Status: DC
  Administered 2024-03-28: 5 mg
  Filled 2024-03-28: qty 1

## 2024-03-28 MED ORDER — GABAPENTIN 250 MG/5ML PO SOLN
300.0000 mg | Freq: Three times a day (TID) | ORAL | Status: DC
  Administered 2024-03-28 – 2024-03-30 (×7): 300 mg
  Filled 2024-03-28 (×10): qty 6

## 2024-03-28 MED ORDER — MIDAZOLAM HCL 2 MG/2ML IJ SOLN
INTRAMUSCULAR | Status: AC
  Filled 2024-03-28: qty 2

## 2024-03-28 MED ORDER — DEXMEDETOMIDINE HCL IN NACL 400 MCG/100ML IV SOLN: 0.0000 ug/kg/h | INTRAVENOUS | Status: DC

## 2024-03-28 MED ORDER — ONDANSETRON HCL 4 MG/2ML IJ SOLN
INTRAMUSCULAR | Status: DC | PRN
  Administered 2024-03-28: 4 mg via INTRAVENOUS

## 2024-03-28 MED ORDER — SODIUM CHLORIDE (PF) 0.9 % IJ SOLN
INTRAMUSCULAR | Status: AC
Start: 2024-03-28 — End: 2024-03-28
  Filled 2024-03-28: qty 10

## 2024-03-28 MED ORDER — DEXMEDETOMIDINE HCL IN NACL 400 MCG/100ML IV SOLN
0.0000 ug/kg/h | INTRAVENOUS | Status: DC
  Administered 2024-03-28: 0.4 ug/kg/h via INTRAVENOUS
  Administered 2024-03-29: 0.7 ug/kg/h via INTRAVENOUS
  Administered 2024-03-29: 0.5 ug/kg/h via INTRAVENOUS
  Filled 2024-03-28 (×2): qty 100

## 2024-03-28 MED ORDER — ETOMIDATE 2 MG/ML IV SOLN
INTRAVENOUS | Status: AC
  Administered 2024-03-28: 10 mg
  Filled 2024-03-28: qty 20

## 2024-03-28 MED ORDER — THROMBIN 5000 UNITS EX KIT
PACK | CUTANEOUS | Status: AC
  Filled 2024-03-28: qty 1

## 2024-03-28 MED ORDER — KETAMINE BOLUS VIA INFUSION: 0.3500 mg/kg | Freq: Four times a day (QID) | INTRAVENOUS | Status: DC | PRN

## 2024-03-28 MED ORDER — PHENYLEPHRINE 80 MCG/ML (10ML) SYRINGE FOR IV PUSH (FOR BLOOD PRESSURE SUPPORT)
PREFILLED_SYRINGE | INTRAVENOUS | Status: DC | PRN
  Administered 2024-03-28: 80 ug via INTRAVENOUS
  Administered 2024-03-28 (×2): 120 ug via INTRAVENOUS
  Administered 2024-03-28 (×3): 80 ug via INTRAVENOUS
  Administered 2024-03-28 (×2): 120 ug via INTRAVENOUS
  Administered 2024-03-28: 160 ug via INTRAVENOUS
  Administered 2024-03-28: 120 ug via INTRAVENOUS
  Administered 2024-03-28 (×2): 80 ug via INTRAVENOUS
  Administered 2024-03-28: 160 ug via INTRAVENOUS

## 2024-03-28 MED ORDER — ONDANSETRON HCL 4 MG/2ML IJ SOLN
INTRAMUSCULAR | Status: AC
  Filled 2024-03-28: qty 2

## 2024-03-28 MED ORDER — LACTATED RINGERS IV SOLN: INTRAVENOUS | Status: DC | PRN

## 2024-03-28 MED ORDER — POTASSIUM CHLORIDE 10 MEQ/100ML IV SOLN
10.0000 meq | INTRAVENOUS | Status: AC
  Administered 2024-03-28 (×2): 10 meq via INTRAVENOUS
  Filled 2024-03-28 (×4): qty 100

## 2024-03-28 MED ORDER — FENTANYL CITRATE (PF) 250 MCG/5ML IJ SOLN
INTRAMUSCULAR | Status: AC
  Filled 2024-03-28: qty 5

## 2024-03-28 MED ORDER — ORAL CARE MOUTH RINSE: 15.0000 mL | OROMUCOSAL | Status: DC | PRN

## 2024-03-28 MED ORDER — ONDANSETRON 4 MG PO TBDP
4.0000 mg | ORAL_TABLET | Freq: Four times a day (QID) | ORAL | Status: DC | PRN
Start: 2024-03-28 — End: 2024-04-12

## 2024-03-28 MED ORDER — DEXAMETHASONE SODIUM PHOSPHATE 10 MG/ML IJ SOLN
INTRAMUSCULAR | Status: DC | PRN
  Administered 2024-03-28: 10 mg via INTRAVENOUS

## 2024-03-28 MED ORDER — METHOCARBAMOL 1000 MG/10ML IJ SOLN
500.0000 mg | Freq: Three times a day (TID) | INTRAMUSCULAR | Status: AC
  Administered 2024-03-28 – 2024-03-29 (×2): 500 mg via INTRAVENOUS
  Filled 2024-03-28 (×2): qty 10

## 2024-03-28 MED ORDER — DOCUSATE SODIUM 50 MG/5ML PO LIQD
100.0000 mg | Freq: Two times a day (BID) | ORAL | Status: DC
  Administered 2024-03-28 – 2024-03-29 (×3): 100 mg
  Filled 2024-03-28 (×3): qty 10

## 2024-03-28 MED ORDER — BACITRACIN ZINC 500 UNIT/GM EX OINT
TOPICAL_OINTMENT | CUTANEOUS | Status: AC
Start: 2024-03-28 — End: 2024-03-28
  Filled 2024-03-28: qty 28.35

## 2024-03-28 MED ORDER — BUPIVACAINE-EPINEPHRINE (PF) 0.25% -1:200000 IJ SOLN
INTRAMUSCULAR | Status: AC
  Filled 2024-03-28: qty 30

## 2024-03-28 MED ORDER — BACITRACIN ZINC 500 UNIT/GM EX OINT
TOPICAL_OINTMENT | CUTANEOUS | Status: DC | PRN
  Administered 2024-03-28 (×2): 1 via TOPICAL

## 2024-03-28 MED ORDER — PROPOFOL 10 MG/ML IV BOLUS
INTRAVENOUS | Status: DC | PRN
  Administered 2024-03-28: 100 mg via INTRAVENOUS

## 2024-03-28 MED ORDER — IOHEXOL 350 MG/ML SOLN
75.0000 mL | Freq: Once | INTRAVENOUS | Status: AC | PRN
  Administered 2024-03-28: 75 mL via INTRAVENOUS

## 2024-03-28 MED ORDER — SODIUM CHLORIDE 0.9% FLUSH: 10.0000 mL | INTRAVENOUS | Status: DC | PRN

## 2024-03-28 MED ORDER — TETANUS-DIPHTH-ACELL PERTUSSIS 5-2.5-18.5 LF-MCG/0.5 IM SUSY
0.5000 mL | PREFILLED_SYRINGE | Freq: Once | INTRAMUSCULAR | Status: AC
  Administered 2024-03-28: 0.5 mL via INTRAMUSCULAR
  Filled 2024-03-28: qty 0.5

## 2024-03-28 MED ORDER — DEXMEDETOMIDINE HCL IN NACL 400 MCG/100ML IV SOLN
INTRAVENOUS | Status: AC
  Filled 2024-03-28: qty 100

## 2024-03-28 MED ORDER — ALBUMIN HUMAN 5 % IV SOLN: INTRAVENOUS | Status: DC | PRN

## 2024-03-28 MED ORDER — FENTANYL CITRATE PF 50 MCG/ML IJ SOSY: 25.0000 ug | PREFILLED_SYRINGE | Freq: Once | INTRAMUSCULAR | Status: DC

## 2024-03-28 MED ORDER — KETAMINE HCL-SODIUM CHLORIDE 1000-0.69 MG/100ML-% IV SOLN
0.5000 mg/kg/h | INTRAVENOUS | Status: DC
  Filled 2024-03-28: qty 100

## 2024-03-28 MED ORDER — ETOMIDATE 2 MG/ML IV SOLN
INTRAVENOUS | Status: DC | PRN
  Administered 2024-03-28: 10 mg via INTRAVENOUS

## 2024-03-28 MED ORDER — METHOCARBAMOL 500 MG PO TABS
500.0000 mg | ORAL_TABLET | Freq: Three times a day (TID) | ORAL | Status: AC
  Administered 2024-03-28 – 2024-03-30 (×5): 500 mg
  Filled 2024-03-28 (×7): qty 1

## 2024-03-28 MED ORDER — ETOMIDATE 2 MG/ML IV SOLN
INTRAVENOUS | Status: AC | PRN
  Administered 2024-03-28: 20 mg via INTRAVENOUS

## 2024-03-28 MED ORDER — SUCCINYLCHOLINE CHLORIDE 200 MG/10ML IV SOSY
PREFILLED_SYRINGE | INTRAVENOUS | Status: DC | PRN
  Administered 2024-03-28: 100 mg via INTRAVENOUS

## 2024-03-28 MED ORDER — FENTANYL 2500MCG IN NS 250ML (10MCG/ML) PREMIX INFUSION
0.0000 ug/h | INTRAVENOUS | Status: DC
  Administered 2024-03-28 – 2024-03-29 (×2): 200 ug/h via INTRAVENOUS
  Filled 2024-03-28 (×2): qty 250

## 2024-03-28 MED ORDER — PROPOFOL 1000 MG/100ML IV EMUL
0.0000 ug/kg/min | INTRAVENOUS | Status: DC
  Filled 2024-03-28: qty 100

## 2024-03-28 MED ORDER — NOREPINEPHRINE 4 MG/250ML-% IV SOLN: 0.0000 ug/min | INTRAVENOUS | Status: DC

## 2024-03-28 MED ORDER — FENTANYL CITRATE (PF) 250 MCG/5ML IJ SOLN
INTRAMUSCULAR | Status: DC | PRN
  Administered 2024-03-28 (×2): 125 ug via INTRAVENOUS

## 2024-03-28 MED ORDER — HYDRALAZINE HCL 20 MG/ML IJ SOLN: 10.0000 mg | INTRAMUSCULAR | Status: DC | PRN

## 2024-03-28 MED ORDER — FENTANYL 2500MCG IN NS 250ML (10MCG/ML) PREMIX INFUSION: 0.0000 ug/h | INTRAVENOUS | Status: DC

## 2024-03-28 MED ORDER — SODIUM CHLORIDE 0.9 % IR SOLN
Status: DC | PRN
Start: 2024-03-28 — End: 2024-03-28
  Administered 2024-03-28: 1000 mL

## 2024-03-28 MED ORDER — 0.9 % SODIUM CHLORIDE (POUR BTL) OPTIME
TOPICAL | Status: DC | PRN
  Administered 2024-03-28: 1000 mL

## 2024-03-28 MED ORDER — VASOPRESSIN 20 UNIT/ML IV SOLN
INTRAVENOUS | Status: AC
Start: 2024-03-28 — End: 2024-03-28
  Filled 2024-03-28: qty 1

## 2024-03-28 MED ORDER — MIDAZOLAM-SODIUM CHLORIDE 100-0.9 MG/100ML-% IV SOLN
0.5000 mg/h | INTRAVENOUS | Status: DC
  Administered 2024-03-28: 0.5 mg/h via INTRAVENOUS
  Administered 2024-03-28: 8 mg/h via INTRAVENOUS
  Filled 2024-03-28 (×2): qty 100

## 2024-03-28 MED ORDER — LACTATED RINGERS IV SOLN
INTRAVENOUS | Status: AC | PRN
  Administered 2024-03-28: 999 mL via INTRAVENOUS
  Administered 2024-03-28: 999 mL/h via INTRAVENOUS

## 2024-03-28 MED ORDER — ACETAMINOPHEN 500 MG PO TABS
1000.0000 mg | ORAL_TABLET | Freq: Four times a day (QID) | ORAL | Status: DC
  Administered 2024-03-28 – 2024-03-30 (×8): 1000 mg
  Filled 2024-03-28 (×9): qty 2

## 2024-03-28 MED ORDER — FENTANYL CITRATE PF 50 MCG/ML IJ SOSY
PREFILLED_SYRINGE | INTRAMUSCULAR | Status: AC
  Filled 2024-03-28: qty 2

## 2024-03-28 MED ORDER — ROCURONIUM BROMIDE 10 MG/ML (PF) SYRINGE
PREFILLED_SYRINGE | INTRAVENOUS | Status: AC
  Filled 2024-03-28: qty 10

## 2024-03-28 MED ORDER — PROPOFOL 500 MG/50ML IV EMUL
INTRAVENOUS | Status: DC | PRN
  Administered 2024-03-28: 50 ug/kg/min via INTRAVENOUS

## 2024-03-28 MED ORDER — HEMOSTATIC AGENTS (NO CHARGE) OPTIME
TOPICAL | Status: DC | PRN
  Administered 2024-03-28: 1 via TOPICAL

## 2024-03-28 MED ORDER — EPHEDRINE 5 MG/ML INJ
INTRAVENOUS | Status: AC
Start: 2024-03-28 — End: 2024-03-28
  Filled 2024-03-28: qty 5

## 2024-03-28 MED ORDER — ENOXAPARIN SODIUM 30 MG/0.3ML IJ SOSY: 30.0000 mg | PREFILLED_SYRINGE | Freq: Two times a day (BID) | INTRAMUSCULAR | Status: DC

## 2024-03-28 MED ORDER — PROPOFOL 1000 MG/100ML IV EMUL
INTRAVENOUS | Status: AC
Start: 2024-03-28 — End: 2024-03-28
  Filled 2024-03-28: qty 100

## 2024-03-28 MED ORDER — ONDANSETRON HCL 4 MG/2ML IJ SOLN
4.0000 mg | Freq: Four times a day (QID) | INTRAMUSCULAR | Status: DC | PRN
  Filled 2024-03-28: qty 2

## 2024-03-28 MED ORDER — KETAMINE HCL-SODIUM CHLORIDE 1000-0.65 MG/100ML-% IV SOLN
0.5000 mg/kg/h | INTRAVENOUS | Status: DC
  Filled 2024-03-28: qty 100

## 2024-03-28 MED ORDER — MIDAZOLAM HCL 2 MG/2ML IJ SOLN
2.0000 mg | INTRAMUSCULAR | Status: DC | PRN
  Administered 2024-03-28 – 2024-03-29 (×8): 2 mg via INTRAVENOUS
  Filled 2024-03-28 (×9): qty 2

## 2024-03-28 MED ORDER — SODIUM CHLORIDE 0.9% FLUSH
10.0000 mL | Freq: Two times a day (BID) | INTRAVENOUS | Status: DC
  Administered 2024-03-28 – 2024-03-29 (×3): 10 mL
  Administered 2024-03-30 (×2): 20 mL
  Administered 2024-03-31 – 2024-04-02 (×6): 10 mL

## 2024-03-28 MED ORDER — NOREPINEPHRINE 4 MG/250ML-% IV SOLN
INTRAVENOUS | Status: AC
  Administered 2024-03-28: 2 ug/min via INTRAVENOUS
  Filled 2024-03-28: qty 250

## 2024-03-28 MED ORDER — PANTOPRAZOLE SODIUM 40 MG IV SOLR
40.0000 mg | Freq: Every day | INTRAVENOUS | Status: DC
  Administered 2024-03-29: 40 mg via INTRAVENOUS
  Filled 2024-03-28: qty 10

## 2024-03-28 MED ORDER — SUCCINYLCHOLINE CHLORIDE 200 MG/10ML IV SOSY
PREFILLED_SYRINGE | INTRAVENOUS | Status: AC
  Administered 2024-03-28: 100 mg
  Filled 2024-03-28: qty 10

## 2024-03-28 MED ORDER — ROCURONIUM BROMIDE 10 MG/ML (PF) SYRINGE
PREFILLED_SYRINGE | INTRAVENOUS | Status: DC | PRN
  Administered 2024-03-28: 40 mg via INTRAVENOUS
  Administered 2024-03-28: 60 mg via INTRAVENOUS
  Administered 2024-03-28: 40 mg via INTRAVENOUS

## 2024-03-28 MED ORDER — BUPIVACAINE-EPINEPHRINE 0.25% -1:200000 IJ SOLN
INTRAMUSCULAR | Status: DC | PRN
  Administered 2024-03-28: 10 mL

## 2024-03-28 MED ORDER — KETAMINE HCL 10 MG/ML IJ SOLN
0.5000 mg/kg/h | Status: DC
  Filled 2024-03-28: qty 100

## 2024-03-28 MED ORDER — CEFAZOLIN SODIUM-DEXTROSE 2-3 GM-%(50ML) IV SOLR
INTRAVENOUS | Status: DC | PRN
  Administered 2024-03-28: 2 g via INTRAVENOUS

## 2024-03-28 MED ORDER — THROMBIN 5000 UNITS EX SOLR
OROMUCOSAL | Status: DC | PRN
  Administered 2024-03-28 (×2): 5 mL via TOPICAL

## 2024-03-28 MED ORDER — PANTOPRAZOLE SODIUM 40 MG PO TBEC: 40.0000 mg | DELAYED_RELEASE_TABLET | Freq: Every day | ORAL | Status: DC

## 2024-03-28 MED ORDER — FENTANYL BOLUS VIA INFUSION: 25.0000 ug | INTRAVENOUS | Status: DC | PRN

## 2024-03-28 MED ORDER — FENTANYL 2500MCG IN NS 250ML (10MCG/ML) PREMIX INFUSION
INTRAVENOUS | Status: AC
Start: 2024-03-28 — End: 2024-03-28
  Administered 2024-03-28: 50 ug/h via INTRAVENOUS
  Filled 2024-03-28: qty 250

## 2024-03-28 MED ORDER — PROPOFOL 10 MG/ML IV BOLUS
INTRAVENOUS | Status: AC
  Filled 2024-03-28: qty 20

## 2024-03-28 MED ORDER — OXYCODONE HCL 5 MG PO TABS
5.0000 mg | ORAL_TABLET | ORAL | Status: DC | PRN
  Filled 2024-03-28: qty 2

## 2024-03-28 MED ORDER — LACTATED RINGERS IV SOLN: INTRAVENOUS | Status: AC

## 2024-03-28 MED ORDER — DEXAMETHASONE SODIUM PHOSPHATE 10 MG/ML IJ SOLN
INTRAMUSCULAR | Status: AC
  Filled 2024-03-28: qty 1

## 2024-03-28 MED ORDER — NOREPINEPHRINE 16 MG/250ML-% IV SOLN
0.0000 ug/min | INTRAVENOUS | Status: DC
  Administered 2024-03-28: 12 ug/min via INTRAVENOUS
  Administered 2024-03-29: 10 ug/min via INTRAVENOUS
  Administered 2024-03-30: 18 ug/min via INTRAVENOUS
  Administered 2024-03-30: 23 ug/min via INTRAVENOUS
  Administered 2024-03-31: 26 ug/min via INTRAVENOUS
  Administered 2024-03-31: 29 ug/min via INTRAVENOUS
  Administered 2024-04-01: 22 ug/min via INTRAVENOUS
  Administered 2024-04-02: 2 ug/min via INTRAVENOUS
  Filled 2024-03-28 (×8): qty 250

## 2024-03-28 MED ORDER — SODIUM CHLORIDE 0.9 % IV SOLN: INTRAVENOUS | Status: DC | PRN

## 2024-03-28 MED ORDER — EPHEDRINE SULFATE-NACL 50-0.9 MG/10ML-% IV SOSY
PREFILLED_SYRINGE | INTRAVENOUS | Status: DC | PRN
  Administered 2024-03-28: 15 mg via INTRAVENOUS
  Administered 2024-03-28: 10 mg via INTRAVENOUS

## 2024-03-28 MED ORDER — POLYETHYLENE GLYCOL 3350 17 G PO PACK
17.0000 g | PACK | Freq: Every day | ORAL | Status: DC | PRN
  Administered 2024-04-06: 17 g

## 2024-03-28 MED ORDER — CHLORHEXIDINE GLUCONATE CLOTH 2 % EX PADS
6.0000 | MEDICATED_PAD | Freq: Every day | CUTANEOUS | Status: DC
  Administered 2024-03-28 – 2024-04-10 (×16): 6 via TOPICAL

## 2024-03-28 MED ORDER — LIDOCAINE 2% (20 MG/ML) 5 ML SYRINGE
INTRAMUSCULAR | Status: AC
Start: 2024-03-28 — End: 2024-03-28
  Filled 2024-03-28: qty 5

## 2024-03-28 MED ORDER — ORAL CARE MOUTH RINSE
15.0000 mL | OROMUCOSAL | Status: DC
  Administered 2024-03-28 – 2024-03-31 (×29): 15 mL via OROMUCOSAL

## 2024-03-28 MED ORDER — CEFAZOLIN SODIUM-DEXTROSE 2-4 GM/100ML-% IV SOLN
2.0000 g | Freq: Once | INTRAVENOUS | Status: AC
  Administered 2024-03-28: 2 g via INTRAVENOUS
  Filled 2024-03-28: qty 100

## 2024-03-28 MED ORDER — PHENYLEPHRINE 80 MCG/ML (10ML) SYRINGE FOR IV PUSH (FOR BLOOD PRESSURE SUPPORT)
PREFILLED_SYRINGE | INTRAVENOUS | Status: AC
  Filled 2024-03-28: qty 10

## 2024-03-28 MED ORDER — PROPOFOL 1000 MG/100ML IV EMUL
0.0000 ug/kg/min | INTRAVENOUS | Status: DC
  Administered 2024-03-28: 10 ug/kg/min via INTRAVENOUS

## 2024-03-28 MED ORDER — MAGNESIUM SULFATE 2 GM/50ML IV SOLN
2.0000 g | Freq: Once | INTRAVENOUS | Status: AC
  Administered 2024-03-28: 2 g via INTRAVENOUS
  Filled 2024-03-28: qty 50

## 2024-03-28 SURGICAL SUPPLY — 77 items
BAG COUNTER SPONGE SURGICOUNT (BAG) ×2 IMPLANT
BAND RUBBER #18 3X1/16 STRL (MISCELLANEOUS) ×4 IMPLANT
BENZOIN TINCTURE PRP APPL 2/3 (GAUZE/BANDAGES/DRESSINGS) ×4 IMPLANT
BIT DRILL NS LF DISP RELINE C (BIT) IMPLANT
BLADE BONE MILL MEDIUM (MISCELLANEOUS) IMPLANT
BLADE CLIPPER SURG (BLADE) ×2 IMPLANT
BLADE SURG 11 STRL SS (BLADE) ×2 IMPLANT
BUR MATCHSTICK NEURO 3.0 LAGG (BURR) ×2 IMPLANT
CANISTER SUCTION 3000ML PPV (SUCTIONS) ×2 IMPLANT
DERMABOND ADVANCED .7 DNX12 (GAUZE/BANDAGES/DRESSINGS) ×2 IMPLANT
DRAIN CHANNEL 15F RND FF W/TCR (WOUND CARE) IMPLANT
DRAPE C-ARM 42X72 X-RAY (DRAPES) IMPLANT
DRAPE LAPAROTOMY 100X72 PEDS (DRAPES) ×2 IMPLANT
DRAPE MICROSCOPE SLANT 54X150 (MISCELLANEOUS) IMPLANT
DRAPE SURG 17X23 STRL (DRAPES) ×8 IMPLANT
DRAPE UTILITY XL STRL (DRAPES) IMPLANT
DURAPREP 26ML APPLICATOR (WOUND CARE) ×2 IMPLANT
ELECTRODE REM PT RTRN 9FT ADLT (ELECTROSURGICAL) ×2 IMPLANT
EVACUATOR SILICONE 100CC (DRAIN) IMPLANT
GAUZE 4X4 16PLY ~~LOC~~+RFID DBL (SPONGE) IMPLANT
GAUZE SPONGE 4X4 12PLY STRL (GAUZE/BANDAGES/DRESSINGS) ×2 IMPLANT
GLOVE BIO SURGEON STRL SZ7 (GLOVE) IMPLANT
GLOVE BIO SURGEON STRL SZ8 (GLOVE) ×2 IMPLANT
GLOVE BIOGEL PI IND STRL 7.0 (GLOVE) IMPLANT
GLOVE EXAM NITRILE XL STR (GLOVE) IMPLANT
GLOVE INDICATOR 8.5 STRL (GLOVE) ×4 IMPLANT
GOWN STRL REUS W/ TWL LRG LVL3 (GOWN DISPOSABLE) IMPLANT
GOWN STRL REUS W/ TWL XL LVL3 (GOWN DISPOSABLE) ×2 IMPLANT
GOWN STRL REUS W/TWL 2XL LVL3 (GOWN DISPOSABLE) IMPLANT
GRAFT BNE CANC CHIPS 1-8 20CC (Bone Implant) IMPLANT
HEMOSTAT POWDER KIT SURGIFOAM (HEMOSTASIS) IMPLANT
IV NS 1000ML BAXH (IV SOLUTION) IMPLANT
KIT BASIN OR (CUSTOM PROCEDURE TRAY) ×2 IMPLANT
KIT TURNOVER KIT B (KITS) ×2 IMPLANT
MARKER SKIN DUAL TIP RULER LAB (MISCELLANEOUS) ×2 IMPLANT
MILL BONE PREP (MISCELLANEOUS) IMPLANT
NDL HYPO 25X1 1.5 SAFETY (NEEDLE) ×2 IMPLANT
NDL SPNL 20GX3.5 QUINCKE YW (NEEDLE) ×2 IMPLANT
NEEDLE HYPO 25X1 1.5 SAFETY (NEEDLE) ×2 IMPLANT
NEEDLE SPNL 20GX3.5 QUINCKE YW (NEEDLE) IMPLANT
NS IRRIG 1000ML POUR BTL (IV SOLUTION) ×2 IMPLANT
PACK LAMINECTOMY NEURO (CUSTOM PROCEDURE TRAY) ×2 IMPLANT
PAD ARMBOARD POSITIONER FOAM (MISCELLANEOUS) ×6 IMPLANT
PIN MAYFIELD SKULL DISP (PIN) ×2 IMPLANT
PUTTY DBM INSTAFILL CART 5CC (Putty) IMPLANT
ROD PB RELINE C 3.5X80 (Rod) IMPLANT
SCREW LOCK RELINE C OPEN (Screw) IMPLANT
SCREW MA RELINE C 3.5X14 (Screw) ×4 IMPLANT
SCREW MA RELINE C 3.5X16 (Screw) ×8 IMPLANT
SCREW REDUCE RELINE-C 4X20 (Screw) ×4 IMPLANT
SCREW REDUCE RELINE-C 4X22 (Screw) ×4 IMPLANT
SCREW SP FA RD RELINE-C 4X20 (Screw) IMPLANT
SCREW SP FA RD RELINE-C 4X22 (Screw) IMPLANT
SCREW SP MA RELINE-C 3.5X14 (Screw) IMPLANT
SCREW SP MA RELINE-C 3.5X16 (Screw) IMPLANT
SEALER BIPOLAR AQUA 6.0 (INSTRUMENTS) IMPLANT
SPIKE FLUID TRANSFER (MISCELLANEOUS) ×2 IMPLANT
SPONGE SURGIFOAM ABS GEL 100 (HEMOSTASIS) ×2 IMPLANT
SPONGE SURGIFOAM ABS GEL SZ50 (HEMOSTASIS) IMPLANT
SPONGE T-LAP 4X18 ~~LOC~~+RFID (SPONGE) IMPLANT
STAPLER SKIN PROX 35W (STAPLE) IMPLANT
STRIP CLOSURE SKIN 1/2X4 (GAUZE/BANDAGES/DRESSINGS) ×2 IMPLANT
SUT ETHILON 4 0 PS 2 18 (SUTURE) IMPLANT
SUT MNCRL AB 4-0 PS2 18 (SUTURE) IMPLANT
SUT VIC AB 0 CT1 18XCR BRD8 (SUTURE) ×2 IMPLANT
SUT VIC AB 1 CT1 18XBRD ANBCTR (SUTURE) IMPLANT
SUT VIC AB 2-0 CP2 18 (SUTURE) IMPLANT
SUT VIC AB 2-0 CT1 18 (SUTURE) ×2 IMPLANT
SUT VIC AB 2-0 CT2 18 VCP726D (SUTURE) IMPLANT
SUT VIC AB 3-0 SH 8-18 (SUTURE) IMPLANT
SUT VIC AB 4-0 PS2 27 (SUTURE) ×2 IMPLANT
SUT VIC AB 4-0 RB1 18 (SUTURE) IMPLANT
TAPE PAPER 2X10 WHT MICROPORE (GAUZE/BANDAGES/DRESSINGS) IMPLANT
TOWEL GREEN STERILE (TOWEL DISPOSABLE) ×2 IMPLANT
TOWEL GREEN STERILE FF (TOWEL DISPOSABLE) ×2 IMPLANT
TRAY FOLEY MTR SLVR 16FR STAT (SET/KITS/TRAYS/PACK) IMPLANT
WATER STERILE IRR 1000ML POUR (IV SOLUTION) ×2 IMPLANT

## 2024-03-28 NOTE — ED Notes (Signed)
 Patient transported to CT

## 2024-03-28 NOTE — Progress Notes (Signed)
 Pt's cuff was blown. Anesthesia was called to re-intubate. A 8.0 @ 25 was placed @ 9:53PM on 03/28/2024. Bite blocked placed as well.

## 2024-03-28 NOTE — Transfer of Care (Signed)
 Immediate Anesthesia Transfer of Care Note  Patient: Tyrone Mckinney  Procedure(s) Performed: CERVICAL TWO TO CERVICAL SEVEN POSTERIOR CERVICAL FUSION, CERVICAL THREE TO CERVICAL SEVEN LAMINECTOMY REPAIR, LACERATION, SCALP  Patient Location: ICU  Anesthesia Type:General  Level of Consciousness: Patient remains intubated per anesthesia plan  Airway & Oxygen Therapy: Patient remains intubated per anesthesia plan and Patient placed on Ventilator (see vital sign flow sheet for setting)  Post-op Assessment: Report given to RN and Post -op Vital signs reviewed and stable  Post vital signs: Reviewed and stable  Last Vitals:  Vitals Value Taken Time  BP 147/80   Temp 34.5 C 03/28/24 13:03  Pulse 54 03/28/24 13:03  Resp 18 03/28/24 13:03  SpO2 100 % 03/28/24 13:03  Vitals shown include unfiled device data.  Last Pain:  Vitals:   03/28/24 0103  TempSrc: Axillary         Complications: No notable events documented.

## 2024-03-28 NOTE — Anesthesia Postprocedure Evaluation (Signed)
 Anesthesia Post Note  Patient: Tyrone Mckinney  Procedure(s) Performed: CERVICAL TWO TO CERVICAL SEVEN POSTERIOR CERVICAL FUSION, CERVICAL THREE TO CERVICAL SEVEN LAMINECTOMY REPAIR, LACERATION, SCALP     Patient location during evaluation: PACU Anesthesia Type: General Level of consciousness: awake and alert Pain management: pain level controlled Vital Signs Assessment: post-procedure vital signs reviewed and stable Respiratory status: spontaneous breathing, nonlabored ventilation, respiratory function stable and patient connected to nasal cannula oxygen Cardiovascular status: blood pressure returned to baseline and stable Postop Assessment: no apparent nausea or vomiting Anesthetic complications: no   No notable events documented.  Last Vitals:  Vitals:   03/28/24 1300 03/28/24 1305  BP:    Pulse: (!) 54 (!) 52  Resp: 18 18  Temp: (!) 34.4 C (!) 34.5 C  SpO2: 100% 100%    Last Pain:  Vitals:   03/28/24 0103  TempSrc: Axillary                 Thom JONELLE Peoples

## 2024-03-28 NOTE — Progress Notes (Signed)
 Per Dr. Darnella, stopped all sedation. MD will come at bedside to complete neurologic assessment.   Family at bedside updated.
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
## 2024-03-28 NOTE — Progress Notes (Addendum)
 SLP Cancellation Note  Patient Details Name: Tyrone Mckinney MRN: [id number] DOB: November 27, 1980   Cancelled treatment:       Reason Eval/Treat Not Completed: Patient at procedure or test/unavailable. Pt currently off unit in surgery. Will continue efforts to complete cognitive linguistic evaluation when appropriate.  Vanissa Strength B. Dory, MSP, CCC-SLP Speech Language Pathologist Office: [phone]  Dory Caprice Daring 03/28/2024, 11:56 AM

## 2024-03-28 NOTE — Progress Notes (Signed)
 MD at bedside... Hold all sedation for another 20 min until he returns to re-assess.   Family at bedside and updates provided by MD.

## 2024-03-28 NOTE — Anesthesia Procedure Notes (Signed)
 Procedure Name: Intubation Date/Time: 03/28/2024 9:53 PM  Performed by: Jama Powell NOVAK, CRNAPre-anesthesia Checklist: Emergency Drugs available, Patient being monitored, Suction available, Patient identified and Timeout performed Patient Re-evaluated:Patient Re-evaluated prior to induction Oxygen Delivery Method: Ambu bag Preoxygenation: Pre-oxygenation with 100% oxygen Induction Type: IV induction Grade View: Grade I Tube type: Oral Tube size: 8.0 mm Airway Equipment and Method: Bougie stylet and Video-laryngoscopy Placement Confirmation: breath sounds checked- equal and bilateral, CO2 detector, positive ETCO2 and ETT inserted through vocal cords under direct vision Secured at: 25 cm Tube secured with: Tape Dental Injury: Teeth and Oropharynx as per pre-operative assessment

## 2024-03-28 NOTE — Anesthesia Procedure Notes (Signed)
 Arterial Line Insertion Start/End8/14/2025 7:52 AM, 03/28/2024 7:55 AM Performed by: Darlyn Rush, MD, Cindie Donald CROME, CRNA, CRNA  Patient location: OR. Preanesthetic checklist: patient identified, IV checked, site marked, risks and benefits discussed, surgical consent, monitors and equipment checked, pre-op evaluation, timeout performed and anesthesia consent Patient sedated Left, radial was placed Catheter size: 20 G Hand hygiene performed  and maximum sterile barriers used   Attempts: 1 Procedure performed without using ultrasound guided technique. Following insertion, dressing applied and Biopatch. Post procedure assessment: normal and unchanged  Patient tolerated the procedure well with no immediate complications.

## 2024-03-28 NOTE — ED Notes (Signed)
 Patient transported to MRI
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
## 2024-03-28 NOTE — Procedures (Signed)
 Laceration Repair  Date/Time: 03/28/2024 3:58 AM  Performed by: Ann Fine, MD Authorized by: Ann Fine, MD   Consent:    Consent obtained:  Emergent situation Universal protocol:    Patient identity confirmed:  Arm band Anesthesia:    Anesthesia method:  None Laceration details:    Location:  Scalp   Scalp location: frontal extending into right parietal scalp.   Length (cm):  20 Pre-procedure details:    Preparation:  Patient was prepped and draped in usual sterile fashion Exploration:    Limited defect created (wound extended): no     Hemostasis achieved with:  Tied off vessels and direct pressure   Wound exploration: entire depth of wound visualized     Wound extent: foreign bodies/material     Foreign bodies/material:  Debrisand hair washed out Treatment:    Area cleansed with:  Povidone-iodine   Amount of cleaning:  Extensive   Irrigation solution:  Sterile saline   Irrigation volume:  300cc   Irrigation method:  Pressure wash   Visualized foreign bodies/material removed: yes     Debridement:  None   Undermining:  Extensive   Layers/structures repaired:  Kerney Kerney:    Suture size:  4-0   Suture material:  Vicryl   Suture technique:  Simple interrupted Skin repair:    Repair method:  Sutures and staples   Suture technique: 4-0 vicryl used to approximate deeper layer of dermis and staples used to close epidermis. Approximation:    Approximation:  Loose Repair type:    Repair type:  Intermediate Post-procedure details:    Dressing:  Bulky dressing   Procedure completion:  Tolerated with difficulty
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
## 2024-03-28 NOTE — Progress Notes (Signed)
 Transported patient to MRI while patient was on the ventilator. Patient remained stable during transport.

## 2024-03-28 NOTE — ED Notes (Signed)
 Pt moving and refusing to lay still. Not following commands pulling at c collar and moving neck.

## 2024-03-28 NOTE — Anesthesia Preprocedure Evaluation (Addendum)
 Anesthesia Evaluation  Patient identified by MRN, date of birth, ID band Patient awake    Reviewed: Allergy & Precautions, NPO status , Patient's Chart, lab work & pertinent test results  Airway Mallampati: Intubated  TM Distance: >3 FB Neck ROM: Full    Dental  (+) Dental Advisory Given   Pulmonary neg pulmonary ROS   breath sounds clear to auscultation   + intubated    Cardiovascular negative cardio ROS  Rhythm:Regular Rate:Normal     Neuro/Psych negative neurological ROS     GI/Hepatic negative GI ROS, Neg liver ROS,,,  Endo/Other  negative endocrine ROS    Renal/GU negative Renal ROS     Musculoskeletal negative musculoskeletal ROS (+)    Abdominal   Peds  Hematology negative hematology ROS (+)   Anesthesia Other Findings   Reproductive/Obstetrics                              Anesthesia Physical Anesthesia Plan  ASA: 3 and emergent  Anesthesia Plan: General   Post-op Pain Management:    Induction:   PONV Risk Score and Plan: 3 and Ondansetron , Dexamethasone  and Treatment may vary due to age or medical condition  Airway Management Planned: Oral ETT  Additional Equipment: Arterial line  Intra-op Plan:   Post-operative Plan: Post-operative intubation/ventilation  Informed Consent: I have reviewed the patients History and Physical, chart, labs and discussed the procedure including the risks, benefits and alternatives for the proposed anesthesia with the patient or authorized representative who has indicated his/her understanding and acceptance.     Dental advisory given and Consent reviewed with POA  Plan Discussed with: CRNA  Anesthesia Plan Comments:          Anesthesia Quick Evaluation

## 2024-03-28 NOTE — Plan of Care (Signed)
  Problem: Education: Goal: Knowledge of General Education information will improve Description: Including pain rating scale, medication(s)/side effects and non-pharmacologic comfort measures Outcome: Progressing   Problem: Health Behavior/Discharge Planning: Goal: Ability to manage health-related needs will improve Outcome: Progressing   Problem: Clinical Measurements: Goal: Ability to maintain clinical measurements within normal limits will improve Outcome: Progressing Goal: Will remain free from infection Outcome: Progressing Goal: Diagnostic test results will improve Outcome: Progressing Goal: Respiratory complications will improve Outcome: Progressing Goal: Cardiovascular complication will be avoided Outcome: Progressing   Problem: Coping: Goal: Level of anxiety will decrease Outcome: Progressing   Problem: Elimination: Goal: Will not experience complications related to bowel motility Outcome: Progressing Goal: Will not experience complications related to urinary retention Outcome: Progressing   Problem: Pain Managment: Goal: General experience of comfort will improve and/or be controlled Outcome: Progressing   Problem: Safety: Goal: Ability to remain free from injury will improve Outcome: Progressing   Problem: Respiratory: Goal: Ability to maintain a clear airway and adequate ventilation will improve Outcome: Progressing   Problem: Role Relationship: Goal: Method of communication will improve Outcome: Progressing

## 2024-03-28 NOTE — Consult Note (Signed)
 I reviewed his CT arteriogram and the radiologist report.  The report indicates a possible grade 1 injury of the cervical internal carotid arteries and vertebral arteries.  I reviewed the imaging.  There is no intimal flap or intraluminal thrombus present.  Unless otherwise contraindicated, prophylactic aspirin  recommended.  This could be given via NG tube or as a suppository.  Repeat  CT arteriogram of the head and neck in 24 hours to monitor for any worsening.

## 2024-03-28 NOTE — ED Provider Notes (Signed)
  Physical Exam  BP (!) 143/65   Pulse (!) 45   Resp (!) 38   Ht 6' 1 (1.854 m)   SpO2 100%   BMI 26.39 kg/m   Physical Exam  Procedures  Procedure Name: Intubation Date/Time: 03/28/2024 1:33 AM  Performed by: Logan Ubaldo NOVAK, PA-CPre-anesthesia Checklist: Patient identified, Emergency Drugs available, Suction available, Patient being monitored and Timeout performed Oxygen Delivery Method: Non-rebreather mask Preoxygenation: Pre-oxygenation with 100% oxygen Induction Type: IV induction Ventilation: Mask ventilation without difficulty Laryngoscope Size: Glidescope Tube size: 7.5 mm Number of attempts: 1 Airway Equipment and Method: Video-laryngoscopy Placement Confirmation: Positive ETCO2, CO2 detector and Breath sounds checked- equal and bilateral Secured at: 23 cm Tube secured with: ETT holder      ED Course / MDM    Medical Decision Making Amount and/or Complexity of Data Reviewed Labs: ordered. Radiology: ordered.  Risk Prescription drug management.         Logan Ubaldo NOVAK DEVONNA 03/28/24 0134    Griselda Norris, MD 03/28/24 [phone]
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
## 2024-03-28 NOTE — Anesthesia Procedure Notes (Signed)
 Central Venous Catheter Insertion Performed by: Erma Thom SAUNDERS, MD, anesthesiologist Start/End8/14/2025 12:15 PM, 03/28/2024 12:30 PM Patient location: OR. Preanesthetic checklist: patient identified, IV checked, site marked, risks and benefits discussed, surgical consent, monitors and equipment checked, pre-op evaluation, timeout performed and anesthesia consent Position: Trendelenburg Lidocaine  1% used for infiltration and patient sedated Hand hygiene performed  and maximum sterile barriers used  Catheter size: 8 Fr Total catheter length 16. Central line was placed.Double lumen Procedure performed using ultrasound guided technique. Ultrasound Notes:anatomy identified, needle tip was noted to be adjacent to the nerve/plexus identified, no ultrasound evidence of intravascular and/or intraneural injection and image(s) printed for medical record Attempts: 1 Following insertion, dressing applied and line sutured. Post procedure assessment: blood return through all ports  Patient tolerated the procedure well with no immediate complications.

## 2024-03-28 NOTE — Progress Notes (Signed)
 PT Cancellation Note  Patient Details Name: Joshwa Serque Librizzi MRN: [id number] DOB: [birth year]   Cancelled Treatment:    Reason Eval/Treat Not Completed: Medical issues which prohibited therapy;Patient not medically ready - pt just got back from OR, remains intubated/sedated. PT to check back at a later date.   Sukhman Kocher S, PT DPT Acute Rehabilitation Services Secure Chat Preferred  Office [phone]    Johana FORBES Kingdom 03/28/2024, 1:14 PM

## 2024-03-28 NOTE — Progress Notes (Signed)
 OT Cancellation Note  Patient Details Name: Shloimy Serque Parveen MRN: [id number] DOB: 1980/11/27   Cancelled Treatment:    Reason Eval/Treat Not Completed: Patient not medically ready (Pt intubated/sedated. Will follow up at a later time.)  [facility] 03/28/2024, 6:08 AM Kreg Sink, OT/L   Acute OT Clinical Specialist Acute Rehabilitation Services Pager [phone] Office [phone]

## 2024-03-28 NOTE — ED Notes (Signed)
 MD Ann arrived
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
## 2024-03-28 NOTE — Progress Notes (Signed)
 Notified by nurse that he had apparently chewed through ET tube port and balloon was no longer holding pressure. Dr. Lucious came to bedside to exchange tube, uneventfully. Balloon was busted  Current sedation includes fentanyl  gtt and versed  prn; adding precidex gtt and bite block. CXR ordered  Wife updated at bedside  Lonni Pizza, MD [facility], [facility]

## 2024-03-28 NOTE — Anesthesia Procedure Notes (Signed)
 Procedure Name: Intubation Date/Time: 03/28/2024 8:10 AM  Performed by: Roslynn Waddell LABOR, CRNAPre-anesthesia Checklist: Patient identified, Emergency Drugs available, Suction available and Patient being monitored Patient Re-evaluated:Patient Re-evaluated prior to induction Oxygen Delivery Method: Circle System Utilized Preoxygenation: Pre-oxygenation with 100% oxygen Induction Type: IV induction Laryngoscope Size: Glidescope and 3 Tube type: Oral Tube size: 7.5 mm Number of attempts: 1 Airway Equipment and Method: Stylet and Oral airway Placement Confirmation: ETT inserted through vocal cords under direct vision, positive ETCO2 and breath sounds checked- equal and bilateral Secured at: 24 cm Tube secured with: Tape Dental Injury: Teeth and Oropharynx as per pre-operative assessment  Comments: Patient intubated in ED, but prior to flipping prone a significant ETT cuff leak was detected with MDA present. Decision made to exchange ETT via cook exchange catheter. GS used for visualization of glottis. Catheter placed through defective ETT and defective ETT easily removed. New ETT placed easily. EBBS and positive EtCO2.

## 2024-03-29 ENCOUNTER — Inpatient Hospital Stay (HOSPITAL_COMMUNITY): Payer: Self-pay

## 2024-03-29 ENCOUNTER — Inpatient Hospital Stay (HOSPITAL_COMMUNITY): Payer: MEDICAID

## 2024-03-29 DIAGNOSIS — R9431 Abnormal electrocardiogram [ECG] [EKG]: Secondary | ICD-10-CM

## 2024-03-29 LAB — ECHOCARDIOGRAM COMPLETE
AR max vel: 2.5 cm2
AV Area VTI: 2.53 cm2
AV Area mean vel: 2.4 cm2
AV Mean grad: 10.3 mmHg
AV Peak grad: 20.2 mmHg
Ao pk vel: 2.25 m/s
Area-P 1/2: 4.15 cm2
Height: 73 in
S' Lateral: 2.81 cm
Weight: 2880 [oz_av]

## 2024-03-29 LAB — BASIC METABOLIC PANEL WITH GFR
Anion gap: 9 (ref 5–15)
BUN: 7 mg/dL (ref 6–20)
CO2: 22 mmol/L (ref 22–32)
Calcium: 8.2 mg/dL — ABNORMAL LOW (ref 8.9–10.3)
Chloride: 112 mmol/L — ABNORMAL HIGH (ref 98–111)
Creatinine, Ser: 0.81 mg/dL (ref 0.61–1.24)
GFR, Estimated: >60 mL/min (ref >60)
Glucose, Bld: 115 mg/dL — ABNORMAL HIGH (ref 70–99)
Potassium: 3.9 mmol/L (ref 3.5–5.1)
Sodium: 143 mmol/L (ref 135–145)

## 2024-03-29 LAB — CBC
HCT: 27.9 % — ABNORMAL LOW (ref 39.0–52.0)
Hemoglobin: 9.9 g/dL — ABNORMAL LOW (ref 13.0–17.0)
MCH: 32.4 pg (ref 26.0–34.0)
MCHC: 35.5 g/dL (ref 30.0–36.0)
MCV: 91.2 fL (ref 80.0–100.0)
Platelets: 176 K/uL (ref 150–400)
RBC: 3.06 MIL/uL — ABNORMAL LOW (ref 4.22–5.81)
RDW: 13.2 % (ref 11.5–15.5)
WBC: 13 K/uL — ABNORMAL HIGH (ref 4.0–10.5)
nRBC: 0 % (ref 0.0–0.2)

## 2024-03-29 LAB — GLUCOSE, CAPILLARY
Glucose-Capillary: 134 mg/dL — ABNORMAL HIGH (ref 70–99)
Glucose-Capillary: 143 mg/dL — ABNORMAL HIGH (ref 70–99)

## 2024-03-29 LAB — MAGNESIUM: Magnesium: 1.8 mg/dL (ref 1.7–2.4)

## 2024-03-29 LAB — PHOSPHORUS: Phosphorus: 2.8 mg/dL (ref 2.5–4.6)

## 2024-03-29 MED ORDER — ADULT MULTIVITAMIN W/MINERALS CH
1.0000 | ORAL_TABLET | Freq: Every day | ORAL | Status: DC
  Administered 2024-03-29 – 2024-03-30 (×2): 1
  Filled 2024-03-29 (×2): qty 1

## 2024-03-29 MED ORDER — OXYCODONE HCL 5 MG PO TABS
5.0000 mg | ORAL_TABLET | ORAL | Status: DC | PRN
  Administered 2024-03-29 – 2024-03-30 (×6): 10 mg
  Filled 2024-03-29 (×6): qty 2

## 2024-03-29 MED ORDER — LACTATED RINGERS IV SOLN: INTRAVENOUS | Status: AC

## 2024-03-29 MED ORDER — QUETIAPINE FUMARATE 25 MG PO TABS
50.0000 mg | ORAL_TABLET | Freq: Two times a day (BID) | ORAL | Status: DC
  Administered 2024-03-29 – 2024-03-30 (×3): 50 mg
  Filled 2024-03-29 (×3): qty 2

## 2024-03-29 MED ORDER — PIVOT 1.5 CAL PO LIQD
1000.0000 mL | ORAL | Status: DC
  Administered 2024-03-29: 1000 mL

## 2024-03-29 MED ORDER — THIAMINE MONONITRATE 100 MG PO TABS
100.0000 mg | ORAL_TABLET | Freq: Every day | ORAL | Status: DC
  Administered 2024-03-29 – 2024-03-30 (×2): 100 mg
  Filled 2024-03-29 (×2): qty 1

## 2024-03-29 MED ORDER — MAGNESIUM SULFATE 2 GM/50ML IV SOLN
2.0000 g | Freq: Once | INTRAVENOUS | Status: AC
  Administered 2024-03-29: 2 g via INTRAVENOUS
  Filled 2024-03-29: qty 50

## 2024-03-29 MED ORDER — CALCIUM GLUCONATE-NACL 1-0.675 GM/50ML-% IV SOLN
1.0000 g | Freq: Once | INTRAVENOUS | Status: AC
  Administered 2024-03-29: 1000 mg via INTRAVENOUS
  Filled 2024-03-29: qty 50

## 2024-03-29 MED ORDER — SODIUM CHLORIDE 0.9 % IV SOLN: INTRAVENOUS | Status: AC | PRN

## 2024-03-29 MED ORDER — MIDAZOLAM BOLUS VIA INFUSION
2.0000 mg | INTRAVENOUS | Status: DC | PRN
  Administered 2024-03-29: 2 mg via INTRAVENOUS

## 2024-03-29 MED FILL — Thrombin For Soln 5000 Unit: CUTANEOUS | Qty: 5000 | Status: AC

## 2024-03-29 NOTE — Progress Notes (Signed)
 Subjective: Extubated.  Responsive and following commands.   Objective: Vital signs in last 24 hours: Temp:  [95 F (35 C)-100.6 F (38.1 C)] 100.4 F (38 C) (08/15 1200) Pulse Rate:  [50-91] 71 (08/15 1200) Resp:  [11-24] 13 (08/15 1200) SpO2:  [99 %-100 %] 99 % (08/15 1200) Arterial Line BP: (84-164)/(52-82) 139/63 (08/15 1200) FiO2 (%):  [40 %] 40 % (08/15 0818)  Intake/Output from previous day: 08/14 0701 - 08/15 0700 In: 5912.3 [I.V.:5173.8; NG/GT:50; IV Piggyback:688.5] Out: 6380 [Urine:5800; Drains:230; Blood:350] Intake/Output this shift: Total I/O In: 446.4 [I.V.:346.4; IV Piggyback:100] Out: 540 [Urine:475; Drains:65]  Recent Labs    03/28/24 0400 03/28/24 0606 03/28/24 1033 03/28/24 1514 03/29/24 0512  HGB 10.2* 12.1* 10.5* 11.3* 9.9*   Recent Labs    03/28/24 1514 03/29/24 0512  WBC 13.1* 13.0*  RBC 3.50* 3.06*  HCT 32.6* 27.9*  PLT 197 176   Recent Labs    03/28/24 0326 03/28/24 0400 03/28/24 1033 03/29/24 0512  NA 139   < > 144 143  K 4.2   < > 4.6 3.9  CL 109  --   --  112*  CO2 23  --   --  22  BUN 13  --   --  7  CREATININE 0.95  --   --  0.81  GLUCOSE 145*  --   --  115*  CALCIUM  7.8*  --   --  8.2*   < > = values in this interval not displayed.   Recent Labs    03/28/24 0112  INR 0.9    Left hand: Intact capillary refill in fingertips.  States he can feel me touching hand but unable to indicate which finger I am touching.  No active motor below elbow.  Swollen at wrist.  Abrasion on radial side and puncture wound on ulnar side both dressed with adhesive bandage.  No erythema or drainage.  Compartments soft.  Right hand: intact capillary refill.  No active motor in hand.  Shrugs shoulders.  XR: left forearm: ulnar styloid fracture.  Widened SL interval.  Possible scaphoid fracture  Assessment/Plan: Left wrist ulnar styloid fracture.  Removable wrist splint for now.  CT left wrist.  Will follow.   Coda Filler 03/29/2024, 2:29  PM

## 2024-03-29 NOTE — Procedures (Signed)
 Cortrak  Tube Type:  Cortrak - 43 inches Tube Location:  Left nare Initial Placement:  Stomach Secured by: Bridle Technique Used to Measure Tube Placement:  Marking at nare/corner of mouth Cortrak Secured At:  75 cm   Cortrak Tube Team Note:  Consult received to place a Cortrak feeding tube.   No x-ray is required. RN may begin using tube.   If the tube becomes dislodged please keep the tube and contact the Cortrak team at [email or web address] for replacement.  If after hours and replacement cannot be delayed, place a NG tube and confirm placement with an abdominal x-ray.    Torrance Freestone MS, RD, LDN If unable to be reached, please send secure chat to "RD inpatient" available from 8:00a-4:00p daily
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
## 2024-03-29 NOTE — Progress Notes (Signed)
*  PRELIMINARY RESULTS* Echocardiogram 2D Echocardiogram has been performed.  Benard FORBES Stallion 03/29/2024, 3:55 PM

## 2024-03-29 NOTE — Procedures (Signed)
 Arterial Line Insertion Start/End8/15/2025 2:45 PM, 03/29/2024 2:58 PM  Patient location: ICU. Preanesthetic checklist: patient identified, site marked, monitors and equipment checked and timeout performed Emergency situation Right, radial was placed Catheter size: 20 G Hand hygiene performed  and maximum sterile barriers used  Allen's test indicative of satisfactory collateral circulation Procedure performed without using ultrasound guided technique. Following insertion, Biopatch and dressing applied. Post procedure assessment: normal and unchanged  Post procedure complications: unsuccessful attempts and second provider assisted. Patient tolerated the procedure well with no immediate complications. Additional procedure comments: 1 unsuccessful attempt; successful on 2nd attempt. Good blood flow and waveform.SABRA

## 2024-03-29 NOTE — Progress Notes (Signed)
 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 ITSS screening not completed at this time, will allow psych to evaluate for PTSD/depression symptoms and provide recommendations.

## 2024-03-29 NOTE — Progress Notes (Signed)
 Orthopedic Tech Progress Note Patient Details:  Rafiel Serque Broden August 02, 1981 [id number]  Ortho Devices Type of Ortho Device: Velcro wrist splint Ortho Device/Splint Location: LUE Ortho Device/Splint Interventions: Ordered   Post Interventions Patient Tolerated: Well Instructions Provided: Care of device Patient denied application of wrist. I left in the room when he is ready.   Giovanni LITTIE Lukes 03/29/2024, 7:40 PM

## 2024-03-29 NOTE — Discharge Instructions (Addendum)
In a time of Crisis: [facility]  Mobile Crisis Management provides immediate crisis response, 24/7.  Call [phone]  [facility] for MH/DD/SA [facility] is available 24 hours a day, 7 days a week. Customer Service Specialists will assist you to find a crisis provider that is well-matched with your needs. Your local number is: [phone]  [facility] ([facility]) IOP, individual counseling, medication management [address] [place], [place] [address] [phone] Call for intake hours; Medicaid and Uninsured    Outpatient Providers  [facility] ([facility]) Group and individual counseling. [address]  [place], [place] [address] [phone] [place]: [phone]  [place]: [phone] Medicaid and uninsured.   [facility] Offers IOP groups multiple times per week. [address] Sherian Maroon [place], [place] [address] [phone] Takes Medicaid and other insurances.   [facility]  Chemical Dependency Intensive Outpatient Program (IOP) [address] [place], [place] [address] [phone] Takes Nurse, learning disability and [place].   [facility]  IOP and Partial Hospitalization Program  [address]  [place], [place] [address] [phone] Private Insurance, [place] only for partial hospitalization  [facility] [address], [place], [place] [address] [phone] Monday-Friday. Short and Long term options. [facility] ([facility]) IOP, individual counseling, medication management [address] [place], [place] [address] [phone] Medicaid and [facility]  [facility] [address]  [place], [place] [address] [phone] Private Insurance and Self Pay   [facility] [address]  [place], [place] [address] [phone] Private Insurance, [place], and Self Pay   [facility]  [address] [place], [place] [address] [facility]  [address]  [place], [place] [address] [phone]  [facility]  [address] [place], [place] [address] [phone]  [facility] [phone] [facility] and [facility]      Residential Treatment Programs  [facility] [address] [place], [place] [address] [phone] or [phone] Detox and Residential Rehab 14 days (Medicare, Medicaid, private insurance, and self pay). No methadone. Call for pre-screen.   [facility]) [address]  [place], [place] [address] [phone] Detox (self Pay and Medicaid Limited availability) Rehab Only Male (Medicare, Medicaid, and Self Pay)-No methadone.  [facility] [address] [place], [place] [address] [phone] or [phone] Private Insurance only  [facility] [address] [place], [place] [address] Phone:  [phone] Must be detoxed 72 hours prior to admission; 28 day program.  Self-pay.  [facility] [address]  [place], [place] [phone] [employer], Medicare, [place] (not straight [place]). They offer assistance with transportation.   [facility] [address] [place],  [place], [place] [address] [phone] Christian Based Program. Men only. No insurance  [facility] [address] [place], [place] [address] Women's: [phone] Men's: [phone] No Medicaid.   [facility] [place] Locations across the [place] (mainly [place]) willing to help with transportation.  [phone] [employer]. [facility]  [address]  [place], [place] [address] [phone] Treatment Only, must make  assessment appointment, and must be sober for assessment appointment. Self pay, Medicare A and B, [facility],  must be [facility] resident. No methadone.   [address] [place], [place] [address] Phone: [phone] Inpatient 24/7 and outpatient services. Individuals with Medicaid have no obligation for a copay. Individuals with Medicare or private insurance will be obligated to meet their policy's requirement(s). Individuals who are uninsured will be eligible for a sliding or discounted scaled  [facility]  [address] [place], [place] [address] [phone] No pending legal charges, Long-term work program. No methadone. Call for assessment.  [facility]  [address], [place], [place] [address] [phone] or [phone] Commercial Insurance Only  [facility] - [phone] Private Insurance (no [place]). Males/Females, call to make referrals, multiple facilities   [facility] [address]  [place], [place] [address]  [phone] Men Only Upfront Fee   [facility]-no methadone [facility] [address] [place], [place] [address] [phone] (f)         AA Meetings Website to locate meetings (virtually or in person): [email or web address].[email or web address].biz/ Phone: [phone]  Syringe Services Program: Due to COVID-19, syringe services programs are likely operating under different hours with limited or no fixed site hours. Some programs may not be operating at all. Please contact the program directly using the phone numbers provided below to see if they are still operating under COVID-19.  [facility] Solution to the Opioid Problem ([facility]) Fixed; mobile; peer-based Roxy Cedar [phone] [email or web address] Fixed site exchange at [facility], [address] [place], [place] [address] on Wednesdays (2:00 - 5:00 pm) and Thursdays  (4:00 - 8:00 pm). Pop-up mobile exchange locations: [employer], [address] [place], [place] [address] on Tuesdays (11:00 am - 1:00 pm) and Fridays (11:00 am - 1:00 pm)  -[facility] - [address], [place], [place] [address] on Tuesdays (2:00 - 4:00 pm) and Fridays (2:00 - 4:00 pm)  -[facility] copy - also serves Radio broadcast assistant and [employer] [place] [employer]; Fixed; mobile; peer-based; Lendon Ka [phone] [email or web address] [address] [place], [place] [address] Delivery and outreach available in [place] and [place], please call for more information. Monday, Tuesday: 1:00 -7:00 pm, Thursday: 4:00 pm - 8:00 pm or Friday: 1:00 pm - 8:00 pm  Medication-Assisted Treatment (MAT):  -[facility]- services [address] and surrounding areas including [place], [place], [place], [place], [address], [place], [place], [place], [place], and [place], [place]. Options include Methadone, buprenorphine or Suboxone. [address], [place], [place] [address] Phone: [phone] Mon - Fri: 5:30am - 2:00pm Sat: 5:30am -7:30am Sun: Closed Holidays: 6:00am - 8:00am  -[facility]- We use FDA-approved medications, like methadone/suboxone/sublocade, and vivitrol. These medications are then combined with customized care plans that include individual or group counseling, toxicology, and medical care directed by on-site physicians. Accepts most insurance plans, Medicaid, and private pay.  [address] [place], [place] [address] Phone: [phone] Monday-Friday 5:00 AM - 10:00 AM Saturday 6:00 AM - 8:30 AM Sunday 6:00 AM - 7:00 AM  -[facility]- [facility] is a treatment & recovery focused program. In addition to receiving methadone medication, our clients participate in individual and group counseling as well as random drug testing. If accepted into the [facility] Opioid Program, you  will be provided several intake appointments and a physical exam [address] [place], [place] [address] Office: [phone]  Fax: [phone]  -[facility]- We put our community members at the  center of everything we do, for remote treatment services as well as in-person, from alcohol withdrawal to opioid use and more.  [address], [place], [place] [address] [phone] Monday-Wednesday: 9:00am - 5:00pm Thursday: 9:00am - 6:00pm Friday: 9:00am - 5:00pm Saturday: 9:00am - 1:00pm Sunday: Closed  -[facility] [place]) [address], [place], [place] [address] [phone]  [place] [phone] [address] [place], [place] [address]  M-W    5:00am-12:00pm Thu     5:00am-10:00am Fri       5:00am-12:00pm Sat      5:00am-8:00am Sun     Closed  $12/daily for Methadone Treatment.

## 2024-03-29 NOTE — Progress Notes (Signed)
 PT Cancellation Note  Patient Details Name: Tyrone Mckinney MRN: [id number] DOB: November 03, 1980   Cancelled Treatment:    Reason Eval/Treat Not Completed: Medical issues which prohibited therapy - Pt lethargic from receiving versed  for procedures this afternoon. PT to check back tomorrow.   Quintin Campi, SPT  Acute Rehab  [phone]   Quintin Campi 03/29/2024, 3:08 PM
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
## 2024-03-29 NOTE — Procedures (Signed)
 Extubation Procedure Note  Patient Details:   Name: Tyrone Mckinney DOB: 1981-06-14 MRN: [id number]   Airway Documentation:    Vent end date: 03/29/24 Vent end time: 0840   Evaluation  O2 sats: stable throughout Complications: No apparent complications Patient did tolerate procedure well. Bilateral Breath Sounds: Clear, Diminished    Patient extubated per MD's order with RT and RN at bedside. Cuff leak present prior to extubating, no stridor noted post. Patient placed on 3L  and tolerating well at this time.      Yes  Joli Koob JINNY Isaias Brash 03/29/2024, 8:47 AM
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
## 2024-03-29 NOTE — Progress Notes (Signed)
 OT Cancellation Note  Patient Details Name: Cylus Serque Lupe MRN: [id number] DOB: 09-Jul-1981   Cancelled Treatment:    Reason Eval/Treat Not Completed: Patient not medically ready;Patient at procedure or test/ unavailable Upon OT first attempt, patient off the floor at X-ray, and upon second attempt was given Versed  for Cor-trak placement. OT will follow back when ready for evaluation.   Ronal Gift E. Camora Tremain, OTR/L Acute Rehabilitation Services [phone]   Ronal Gift Salt 03/29/2024, 2:11 PM

## 2024-03-30 DIAGNOSIS — F430 Acute stress reaction: Secondary | ICD-10-CM | POA: Diagnosis not present

## 2024-03-30 LAB — BASIC METABOLIC PANEL WITH GFR
Anion gap: 6 (ref 5–15)
BUN: 6 mg/dL (ref 6–20)
CO2: 27 mmol/L (ref 22–32)
Calcium: 8.1 mg/dL — ABNORMAL LOW (ref 8.9–10.3)
Chloride: 109 mmol/L (ref 98–111)
Creatinine, Ser: 0.8 mg/dL (ref 0.61–1.24)
GFR, Estimated: >60 mL/min (ref >60)
Glucose, Bld: 147 mg/dL — ABNORMAL HIGH (ref 70–99)
Potassium: 3.8 mmol/L (ref 3.5–5.1)
Sodium: 142 mmol/L (ref 135–145)

## 2024-03-30 LAB — GLUCOSE, CAPILLARY
Glucose-Capillary: 152 mg/dL — ABNORMAL HIGH (ref 70–99)
Glucose-Capillary: 125 mg/dL — ABNORMAL HIGH (ref 70–99)
Glucose-Capillary: 130 mg/dL — ABNORMAL HIGH (ref 70–99)
Glucose-Capillary: 153 mg/dL — ABNORMAL HIGH (ref 70–99)

## 2024-03-30 LAB — CBC
HCT: 27 % — ABNORMAL LOW (ref 39.0–52.0)
Hemoglobin: 9.3 g/dL — ABNORMAL LOW (ref 13.0–17.0)
MCH: 32.6 pg (ref 26.0–34.0)
MCHC: 34.4 g/dL (ref 30.0–36.0)
MCV: 94.7 fL (ref 80.0–100.0)
Platelets: 151 K/uL (ref 150–400)
RBC: 2.85 MIL/uL — ABNORMAL LOW (ref 4.22–5.81)
RDW: 13.2 % (ref 11.5–15.5)
WBC: 10.2 K/uL (ref 4.0–10.5)
nRBC: 0 % (ref 0.0–0.2)

## 2024-03-30 LAB — MAGNESIUM: Magnesium: 1.9 mg/dL (ref 1.7–2.4)

## 2024-03-30 LAB — MRSA NEXT GEN BY PCR, NASAL: MRSA by PCR Next Gen: NOT DETECTED

## 2024-03-30 LAB — PHOSPHORUS: Phosphorus: 3 mg/dL (ref 2.5–4.6)

## 2024-03-30 MED ORDER — POLYETHYLENE GLYCOL 3350 17 G PO PACK
17.0000 g | PACK | Freq: Two times a day (BID) | ORAL | Status: DC
  Administered 2024-03-30: 17 g
  Filled 2024-03-30: qty 1

## 2024-03-30 MED ORDER — ACETAMINOPHEN 500 MG PO TABS
1000.0000 mg | ORAL_TABLET | Freq: Four times a day (QID) | ORAL | Status: DC
  Administered 2024-03-30 – 2024-04-12 (×48): 1000 mg via ORAL
  Filled 2024-03-30 (×50): qty 2

## 2024-03-30 MED ORDER — PIVOT 1.5 CAL PO LIQD
1000.0000 mL | ORAL | Status: DC
  Administered 2024-03-30 (×2): 1000 mL

## 2024-03-30 MED ORDER — HYDROMORPHONE HCL 1 MG/ML IJ SOLN
1.0000 mg | INTRAMUSCULAR | Status: DC | PRN
  Administered 2024-03-30 – 2024-04-02 (×16): 1 mg via INTRAVENOUS
  Filled 2024-03-30 (×16): qty 1

## 2024-03-30 MED ORDER — SENNOSIDES-DOCUSATE SODIUM 8.6-50 MG PO TABS
1.0000 | ORAL_TABLET | Freq: Two times a day (BID) | ORAL | Status: DC
  Administered 2024-03-30: 1
  Filled 2024-03-30: qty 1

## 2024-03-30 MED ORDER — ADULT MULTIVITAMIN W/MINERALS CH
1.0000 | ORAL_TABLET | Freq: Every day | ORAL | Status: DC
  Administered 2024-03-31 – 2024-04-12 (×13): 1 via ORAL
  Filled 2024-03-30 (×13): qty 1

## 2024-03-30 MED ORDER — SENNOSIDES-DOCUSATE SODIUM 8.6-50 MG PO TABS
1.0000 | ORAL_TABLET | Freq: Two times a day (BID) | ORAL | Status: DC
  Administered 2024-03-30 – 2024-04-09 (×18): 1 via ORAL
  Filled 2024-03-30 (×21): qty 1

## 2024-03-30 MED ORDER — PIVOT 1.5 CAL PO LIQD: 1000.0000 mL | ORAL | Status: DC

## 2024-03-30 MED ORDER — TIZANIDINE HCL 4 MG PO TABS
4.0000 mg | ORAL_TABLET | Freq: Three times a day (TID) | ORAL | Status: DC | PRN
  Administered 2024-03-31 – 2024-04-08 (×8): 4 mg via ORAL
  Filled 2024-03-30 (×13): qty 1

## 2024-03-30 MED ORDER — GABAPENTIN 250 MG/5ML PO SOLN
300.0000 mg | Freq: Three times a day (TID) | ORAL | Status: DC
  Administered 2024-03-30 – 2024-04-01 (×5): 300 mg via ORAL
  Filled 2024-03-30 (×6): qty 6

## 2024-03-30 MED ORDER — POLYETHYLENE GLYCOL 3350 17 G PO PACK
17.0000 g | PACK | Freq: Two times a day (BID) | ORAL | Status: DC
  Administered 2024-03-30 – 2024-04-12 (×19): 17 g via ORAL
  Filled 2024-03-30 (×19): qty 1

## 2024-03-30 MED ORDER — THIAMINE MONONITRATE 100 MG PO TABS
100.0000 mg | ORAL_TABLET | Freq: Every day | ORAL | Status: AC
  Administered 2024-03-31 – 2024-04-04 (×5): 100 mg via ORAL
  Filled 2024-03-30 (×5): qty 1

## 2024-03-30 MED ORDER — ENOXAPARIN SODIUM 30 MG/0.3ML IJ SOSY
30.0000 mg | PREFILLED_SYRINGE | Freq: Two times a day (BID) | INTRAMUSCULAR | Status: DC
  Administered 2024-03-30 – 2024-04-12 (×27): 30 mg via SUBCUTANEOUS
  Filled 2024-03-30 (×27): qty 0.3

## 2024-03-30 MED ORDER — OXYCODONE HCL 5 MG PO TABS
5.0000 mg | ORAL_TABLET | ORAL | Status: DC | PRN
  Administered 2024-03-30 – 2024-04-02 (×13): 10 mg via ORAL
  Filled 2024-03-30 (×13): qty 2

## 2024-03-30 MED ORDER — QUETIAPINE FUMARATE 25 MG PO TABS
50.0000 mg | ORAL_TABLET | Freq: Two times a day (BID) | ORAL | Status: DC
  Administered 2024-03-30 – 2024-04-01 (×5): 50 mg via ORAL
  Filled 2024-03-30 (×5): qty 2
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
## 2024-03-30 NOTE — Plan of Care (Signed)
  Problem: Education: Goal: Knowledge of General Education information will improve Description: Including pain rating scale, medication(s)/side effects and non-pharmacologic comfort measures Outcome: Progressing   Problem: Health Behavior/Discharge Planning: Goal: Ability to manage health-related needs will improve Outcome: Progressing   Problem: Clinical Measurements: Goal: Ability to maintain clinical measurements within normal limits will improve Outcome: Progressing Goal: Will remain free from infection Outcome: Progressing Goal: Respiratory complications will improve Outcome: Progressing   Problem: Activity: Goal: Risk for activity intolerance will decrease Outcome: Not Progressing   Problem: Nutrition: Goal: Adequate nutrition will be maintained Outcome: Progressing

## 2024-03-30 NOTE — Progress Notes (Addendum)
 Orthopedic Tech Progress Note Patient Details:  Tyrone Mckinney [birth year] [id number]  Prafos applied to BLE.  Routine order for bilateral resting hand splints were called into [facility].  Ortho Devices Type of Ortho Device: Prafo boot/shoe Ortho Device/Splint Location: BLE Ortho Device/Splint Interventions: Ordered, Application, Adjustment   Post Interventions Patient Tolerated: Well Instructions Provided: Care of device, Adjustment of device  [name] Ronal Brasil 03/30/2024, 4:38 PM
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
## 2024-03-30 NOTE — Progress Notes (Signed)
 NEUROSURGERY PROGRESS NOTE  S/p spinal cord injury. Patient doing ok. States that he can feel in the top of his toes and his hands. Will need aggressive rehab   Temp:  [99.1 F (37.3 C)-101.3 F (38.5 C)] 100.8 F (38.2 C) (08/16 0700) Pulse Rate:  [61-91] 67 (08/16 0700) Resp:  [0-39] 15 (08/16 0700) SpO2:  [88 %-100 %] 100 % (08/16 0700) Arterial Line BP: (83-173)/(55-77) 147/68 (08/16 0700) Weight:  [62.7 kg] 62.7 kg (08/16 0500)    Suzen Chiquita Pean, NP 03/30/2024 9:07 AM

## 2024-03-31 ENCOUNTER — Inpatient Hospital Stay (HOSPITAL_COMMUNITY): Payer: Self-pay

## 2024-03-31 LAB — BASIC METABOLIC PANEL WITH GFR
Anion gap: 9 (ref 5–15)
BUN: 5 mg/dL — ABNORMAL LOW (ref 6–20)
CO2: 28 mmol/L (ref 22–32)
Calcium: 8.5 mg/dL — ABNORMAL LOW (ref 8.9–10.3)
Chloride: 103 mmol/L (ref 98–111)
Creatinine, Ser: 0.76 mg/dL (ref 0.61–1.24)
GFR, Estimated: >60 mL/min (ref >60)
Glucose, Bld: 151 mg/dL — ABNORMAL HIGH (ref 70–99)
Potassium: 3.8 mmol/L (ref 3.5–5.1)
Sodium: 140 mmol/L (ref 135–145)

## 2024-03-31 LAB — CBC
HCT: 28.7 % — ABNORMAL LOW (ref 39.0–52.0)
Hemoglobin: 9.9 g/dL — ABNORMAL LOW (ref 13.0–17.0)
MCH: 32.6 pg (ref 26.0–34.0)
MCHC: 34.5 g/dL (ref 30.0–36.0)
MCV: 94.4 fL (ref 80.0–100.0)
Platelets: 193 K/uL (ref 150–400)
RBC: 3.04 MIL/uL — ABNORMAL LOW (ref 4.22–5.81)
RDW: 12.7 % (ref 11.5–15.5)
WBC: 9.7 K/uL (ref 4.0–10.5)
nRBC: 0 % (ref 0.0–0.2)

## 2024-03-31 MED ORDER — ORAL CARE MOUTH RINSE
15.0000 mL | Freq: Four times a day (QID) | OROMUCOSAL | Status: DC
  Administered 2024-03-31 – 2024-04-10 (×39): 15 mL via OROMUCOSAL

## 2024-03-31 NOTE — Progress Notes (Signed)
 Patient ID: Tyrone Mckinney, male   DOB: [birth year], 43 y.o.   MRN: [id number] No change in exam.  He does describe some feeling in his hands and his feet and feels like he can wiggle his toes at times.  His pain is much better controlled today.
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
## 2024-03-31 NOTE — Plan of Care (Signed)
   Problem: Nutrition: Goal: Adequate nutrition will be maintained Outcome: Progressing   Problem: Coping: Goal: Level of anxiety will decrease Outcome: Progressing   Problem: Safety: Goal: Ability to remain free from injury will improve Outcome: Progressing

## 2024-04-01 ENCOUNTER — Encounter (HOSPITAL_COMMUNITY): Payer: Self-pay

## 2024-04-01 LAB — CBC
HCT: 29 % — ABNORMAL LOW (ref 39.0–52.0)
Hemoglobin: 10.1 g/dL — ABNORMAL LOW (ref 13.0–17.0)
MCH: 32.4 pg (ref 26.0–34.0)
MCHC: 34.8 g/dL (ref 30.0–36.0)
MCV: 92.9 fL (ref 80.0–100.0)
Platelets: 251 K/uL (ref 150–400)
RBC: 3.12 MIL/uL — ABNORMAL LOW (ref 4.22–5.81)
RDW: 12.6 % (ref 11.5–15.5)
WBC: 7.5 K/uL (ref 4.0–10.5)
nRBC: 0 % (ref 0.0–0.2)

## 2024-04-01 LAB — BASIC METABOLIC PANEL WITH GFR
Anion gap: 9 (ref 5–15)
BUN: 9 mg/dL (ref 6–20)
CO2: 27 mmol/L (ref 22–32)
Calcium: 8.8 mg/dL — ABNORMAL LOW (ref 8.9–10.3)
Chloride: 103 mmol/L (ref 98–111)
Creatinine, Ser: 0.91 mg/dL (ref 0.61–1.24)
GFR, Estimated: >60 mL/min (ref >60)
Glucose, Bld: 147 mg/dL — ABNORMAL HIGH (ref 70–99)
Potassium: 3.7 mmol/L (ref 3.5–5.1)
Sodium: 139 mmol/L (ref 135–145)

## 2024-04-01 MED ORDER — GABAPENTIN 300 MG PO CAPS
300.0000 mg | ORAL_CAPSULE | Freq: Three times a day (TID) | ORAL | Status: DC
  Administered 2024-04-01 (×3): 300 mg via ORAL
  Filled 2024-04-01 (×3): qty 1

## 2024-04-01 MED ORDER — BISACODYL 10 MG RE SUPP
10.0000 mg | Freq: Every day | RECTAL | Status: DC
  Administered 2024-04-01 – 2024-04-06 (×6): 10 mg via RECTAL
  Filled 2024-04-01 (×8): qty 1

## 2024-04-01 MED ORDER — POTASSIUM CHLORIDE CRYS ER 20 MEQ PO TBCR
20.0000 meq | EXTENDED_RELEASE_TABLET | Freq: Once | ORAL | Status: AC
  Administered 2024-04-01: 20 meq via ORAL
  Filled 2024-04-01: qty 1

## 2024-04-01 MED ORDER — ASPIRIN 81 MG PO TBEC
81.0000 mg | DELAYED_RELEASE_TABLET | Freq: Every day | ORAL | Status: DC
  Administered 2024-04-04 – 2024-04-12 (×9): 81 mg via ORAL
  Filled 2024-04-01 (×9): qty 1
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
## 2024-04-01 NOTE — TOC Progression Note (Signed)
 Transition of Care [facility]) - Progression Note    Patient Details  Name: Baylin Serque Habibi MRN: [id number] Date of Birth: [birth year]  Transition of [facility] CM/SW Contact  Carmelita FORBES Carbon, LCSW Phone Number: 04/01/2024, 9:37 AM  Clinical Narrative:    ICM notes recommendation for CIR - following for CIR eligibility.    Expected Discharge Plan: IP Rehab Facility Barriers to Discharge: Continued Medical Work up               Expected Discharge Plan and Services       Living arrangements for the past 2 months: Single Family Home                                       Social Drivers of Health (SDOH) Interventions SDOH Screenings   Tobacco Use: Unknown (08/28/2020)    Readmission Risk Interventions     No data to display
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
## 2024-04-02 DIAGNOSIS — G8250 Quadriplegia, unspecified: Secondary | ICD-10-CM

## 2024-04-02 MED ORDER — MIDODRINE HCL 5 MG PO TABS
20.0000 mg | ORAL_TABLET | Freq: Three times a day (TID) | ORAL | Status: DC
  Administered 2024-04-02 – 2024-04-12 (×30): 20 mg via ORAL
  Filled 2024-04-02 (×30): qty 4

## 2024-04-02 MED ORDER — HYDROMORPHONE HCL 1 MG/ML IJ SOLN
0.5000 mg | Freq: Four times a day (QID) | INTRAMUSCULAR | Status: DC | PRN
  Administered 2024-04-03 – 2024-04-09 (×5): 0.5 mg via INTRAVENOUS
  Filled 2024-04-02: qty 0.5
  Filled 2024-04-02: qty 1
  Filled 2024-04-02: qty 0.5
  Filled 2024-04-02: qty 1
  Filled 2024-04-02: qty 0.5
  Filled 2024-04-02: qty 1

## 2024-04-02 MED ORDER — GABAPENTIN 300 MG PO CAPS
600.0000 mg | ORAL_CAPSULE | Freq: Three times a day (TID) | ORAL | Status: DC
  Administered 2024-04-02 – 2024-04-12 (×32): 600 mg via ORAL
  Filled 2024-04-02 (×32): qty 2

## 2024-04-02 MED ORDER — KETOROLAC TROMETHAMINE 15 MG/ML IJ SOLN
30.0000 mg | Freq: Four times a day (QID) | INTRAMUSCULAR | Status: AC
  Administered 2024-04-02 – 2024-04-07 (×20): 30 mg via INTRAVENOUS
  Filled 2024-04-02 (×20): qty 2

## 2024-04-02 MED ORDER — OXYCODONE HCL 5 MG PO TABS
10.0000 mg | ORAL_TABLET | ORAL | Status: DC | PRN
  Administered 2024-04-02 (×3): 15 mg via ORAL
  Administered 2024-04-03: 10 mg via ORAL
  Administered 2024-04-03 (×2): 15 mg via ORAL
  Administered 2024-04-03: 10 mg via ORAL
  Administered 2024-04-04 – 2024-04-09 (×16): 15 mg via ORAL
  Administered 2024-04-10 – 2024-04-11 (×4): 10 mg via ORAL
  Administered 2024-04-11 – 2024-04-12 (×2): 15 mg via ORAL
  Administered 2024-04-12: 10 mg via ORAL
  Filled 2024-04-02 (×3): qty 3
  Filled 2024-04-02: qty 2
  Filled 2024-04-02: qty 3
  Filled 2024-04-02: qty 2
  Filled 2024-04-02 (×2): qty 3
  Filled 2024-04-02: qty 2
  Filled 2024-04-02 (×8): qty 3
  Filled 2024-04-02: qty 2
  Filled 2024-04-02 (×8): qty 3
  Filled 2024-04-02: qty 2
  Filled 2024-04-02: qty 3
  Filled 2024-04-02: qty 2
  Filled 2024-04-02 (×2): qty 3
  Filled 2024-04-02: qty 2

## 2024-04-02 MED ORDER — MIDODRINE HCL 5 MG PO TABS
10.0000 mg | ORAL_TABLET | Freq: Three times a day (TID) | ORAL | Status: DC
  Administered 2024-04-02 (×2): 10 mg via ORAL
  Filled 2024-04-02 (×2): qty 2

## 2024-04-02 MED ORDER — METHOCARBAMOL 500 MG PO TABS
1000.0000 mg | ORAL_TABLET | Freq: Three times a day (TID) | ORAL | Status: DC
  Administered 2024-04-02 – 2024-04-12 (×32): 1000 mg via ORAL
  Filled 2024-04-02 (×32): qty 2

## 2024-04-02 NOTE — Progress Notes (Signed)
 IP rehab admisisons - I have asked rehab MD Dr. Lavorn for a rehab consult for this patient.  Will await consult and recommendations and then will follow up.  [phone]
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
## 2024-04-02 NOTE — TOC Progression Note (Signed)
 Transition of Care [facility] - Progression Note    Patient Details  Name: Tyrone Mckinney MRN: [id number] Date of Birth: April 21, 1981  Transition of [facility] CM/SW Contact  Ketura Sirek M, RN Phone Number: 04/02/2024, 3:52 PM  Clinical Narrative:    Met with patient, brother and wife, per pt request; he is concerned about applying for Medicaid and Disability.  Financial Counseling Referral placed on 03/29/2024 by CSW; emailed financial counselor today regarding continued need for Medicaid and Disability screening.  Family still states they can provide assistance, as well as patient's grown children.  They appreciate Dr. Everitt visit earlier today to discuss rehabilitation.    Inpatient Case Manager will continue to follow/assist with discharge planning.  Will monitor to Financial Counseling intervention.   Expected Discharge Plan: IP Rehab Facility Barriers to Discharge: Continued Medical Work up               Expected Discharge Plan and Services   Discharge Planning Services: CM Consult, Other - See comment (Financial Counseling)   Living arrangements for the past 2 months: Single Family Home                                       Social Drivers of Health (SDOH) Interventions SDOH Screenings   Tobacco Use: Unknown (04/01/2024)    Readmission Risk Interventions     No data to display         Mliss MICAEL Fass, RN, BSN  Trauma/Neuro ICU Case Manager [phone]
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
## 2024-04-03 MED ORDER — PHENYLEPHRINE HCL-NACL 20-0.9 MG/250ML-% IV SOLN: 0.0000 ug/min | INTRAVENOUS | Status: DC

## 2024-04-03 MED ORDER — FLEET ENEMA RE ENEM: 1.0000 | ENEMA | Freq: Every day | RECTAL | Status: DC | PRN

## 2024-04-03 MED ORDER — FLEET ENEMA RE ENEM
1.0000 | ENEMA | Freq: Once | RECTAL | Status: AC
  Administered 2024-04-03: 1 via RECTAL
  Filled 2024-04-03: qty 1

## 2024-04-03 MED ORDER — LACTATED RINGERS IV BOLUS
500.0000 mL | Freq: Once | INTRAVENOUS | Status: AC
  Administered 2024-04-03: 500 mL via INTRAVENOUS

## 2024-04-03 MED ORDER — SODIUM CHLORIDE 0.9 % IV SOLN: 250.0000 mL | INTRAVENOUS | Status: AC

## 2024-04-03 MED ORDER — ENSURE PLUS HIGH PROTEIN PO LIQD
237.0000 mL | Freq: Two times a day (BID) | ORAL | Status: DC
  Administered 2024-04-04 – 2024-04-12 (×18): 237 mL via ORAL

## 2024-04-03 MED ORDER — ALBUMIN HUMAN 5 % IV SOLN
12.5000 g | Freq: Once | INTRAVENOUS | Status: AC
  Administered 2024-04-03: 12.5 g via INTRAVENOUS
  Filled 2024-04-03: qty 250

## 2024-04-03 MED ORDER — PHENYLEPHRINE HCL-NACL 20-0.9 MG/250ML-% IV SOLN
25.0000 ug/min | INTRAVENOUS | Status: DC
  Administered 2024-04-05: 25 ug/min via INTRAVENOUS
  Filled 2024-04-03: qty 250

## 2024-04-03 MED ORDER — LACTATED RINGERS IV BOLUS
500.0000 mL | INTRAVENOUS | Status: AC
  Administered 2024-04-03: 500 mL via INTRAVENOUS

## 2024-04-03 MED ORDER — ATOMOXETINE HCL 25 MG PO CAPS
25.0000 mg | ORAL_CAPSULE | Freq: Every day | ORAL | Status: DC
  Administered 2024-04-03 – 2024-04-12 (×10): 25 mg via ORAL
  Filled 2024-04-03 (×11): qty 1

## 2024-04-03 MED ORDER — DULOXETINE HCL 30 MG PO CPEP
30.0000 mg | ORAL_CAPSULE | Freq: Every day | ORAL | Status: DC
  Administered 2024-04-03 – 2024-04-11 (×9): 30 mg via ORAL
  Filled 2024-04-03 (×10): qty 1
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
## 2024-04-03 NOTE — Progress Notes (Signed)
 IP rehab admissions - I met with patient, his brother and his sister at the bedside.  I spoke with ex-wife briefly, but she had an appointment.  I gave family rehab brochures and discussed process of inpatient rehab admission.  He lives with his ex-wife who works 6am to 12 noon.  He also lives with 43 yo son, 55 and 67 yo daughters.  His brother and sister live out of the area.  I encouraged family to discuss how they could provide 24/7 supervision and care for patient after a DC from rehab.  He is uninsured.  He will need a medicaid and disability application started.  I will follow up again tomorrow.  [phone]
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
## 2024-04-03 NOTE — Progress Notes (Incomplete)
..  Trauma Event Note    Reason for Call :    Last imported Vital Signs BP (!) 83/47 (BP Location: Left Arm) Comment: notified TRN who is notifying trauma MD  Pulse 61   Temp 98.8 F (37.1 C) (Oral)   Resp (!) 22   Ht 6' 1 (1.854 m)   Wt 159 lb 6.3 oz (72.3 kg)   SpO2 94%   BMI 21.03 kg/m   Trending CBC Recent Labs    04/01/24 0445  WBC 7.5  HGB 10.1*  HCT 29.0*  PLT 251    Trending Coag's No results for input(s): APTT, INR in the last 72 hours.  Trending BMET Recent Labs    04/01/24 0445  NA 139  K 3.7  CL 103  CO2 27  BUN 9  CREATININE 0.91  GLUCOSE 147*      Tyrone Mckinney  Trauma Response RN  Please call TRN at [phone] for further assistance.

## 2024-04-04 LAB — CBC
HCT: 24.1 % — ABNORMAL LOW (ref 39.0–52.0)
Hemoglobin: 8.2 g/dL — ABNORMAL LOW (ref 13.0–17.0)
MCH: 31.9 pg (ref 26.0–34.0)
MCHC: 34 g/dL (ref 30.0–36.0)
MCV: 93.8 fL (ref 80.0–100.0)
Platelets: 301 K/uL (ref 150–400)
RBC: 2.57 MIL/uL — ABNORMAL LOW (ref 4.22–5.81)
RDW: 13.2 % (ref 11.5–15.5)
WBC: 7.6 K/uL (ref 4.0–10.5)
nRBC: 0 % (ref 0.0–0.2)

## 2024-04-04 MED ORDER — SODIUM CHLORIDE 0.9 % IV BOLUS
1000.0000 mL | Freq: Once | INTRAVENOUS | Status: AC
  Administered 2024-04-04: 1000 mL via INTRAVENOUS

## 2024-04-04 NOTE — Progress Notes (Signed)
 Hypotension overnight. 80/40s. Despite encouraging patient fluid intake and repositioning cuff.   Notified Trauma on call, Dr. Lyndel.  Orders for NS 1L bolus
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
## 2024-04-04 NOTE — TOC Progression Note (Addendum)
 Transition of Care [facility]) - Progression Note    Patient Details  Name: Tyrone Mckinney MRN: [id number] Date of Birth: 29-Nov-1980  [facility] CM/SW Contact  Carmelita FORBES Carbon, LCSW Phone Number: 04/04/2024, 9:04 AM  Clinical Narrative:    Reached out to Saprese with Financial Counseling - requested update on Medicaid screen.  12:25- Per Saprese with Financial Counseling, she is sending his [facility] Referral and Medicaid application.  Expected Discharge Plan: IP Rehab Facility Barriers to Discharge: Continued Medical Work up               Expected Discharge Plan and Services   Discharge Planning Services: CM Consult, Other - See comment (Financial Counseling)   Living arrangements for the past 2 months: Single Family Home                                       Social Drivers of Health (SDOH) Interventions SDOH Screenings   Food Insecurity: Patient Unable To Answer (04/03/2024)  Housing: Patient Unable To Answer (04/03/2024)  Transportation Needs: Patient Unable To Answer (04/03/2024)  Utilities: Patient Unable To Answer (04/03/2024)  Tobacco Use: Unknown (04/01/2024)    Readmission Risk Interventions     No data to display
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
## 2024-04-05 LAB — CBC
HCT: 24 % — ABNORMAL LOW (ref 39.0–52.0)
Hemoglobin: 8.4 g/dL — ABNORMAL LOW (ref 13.0–17.0)
MCH: 32.4 pg (ref 26.0–34.0)
MCHC: 35 g/dL (ref 30.0–36.0)
MCV: 92.7 fL (ref 80.0–100.0)
Platelets: 314 K/uL (ref 150–400)
RBC: 2.59 MIL/uL — ABNORMAL LOW (ref 4.22–5.81)
RDW: 13.1 % (ref 11.5–15.5)
WBC: 7.2 K/uL (ref 4.0–10.5)
nRBC: 0 % (ref 0.0–0.2)

## 2024-04-05 LAB — BASIC METABOLIC PANEL WITH GFR
Anion gap: 5 (ref 5–15)
BUN: 21 mg/dL — ABNORMAL HIGH (ref 6–20)
CO2: 28 mmol/L (ref 22–32)
Calcium: 8.2 mg/dL — ABNORMAL LOW (ref 8.9–10.3)
Chloride: 105 mmol/L (ref 98–111)
Creatinine, Ser: 0.87 mg/dL (ref 0.61–1.24)
GFR, Estimated: >60 mL/min (ref >60)
Glucose, Bld: 115 mg/dL — ABNORMAL HIGH (ref 70–99)
Potassium: 3.6 mmol/L (ref 3.5–5.1)
Sodium: 138 mmol/L (ref 135–145)

## 2024-04-05 MED ORDER — POTASSIUM CHLORIDE CRYS ER 20 MEQ PO TBCR
40.0000 meq | EXTENDED_RELEASE_TABLET | Freq: Once | ORAL | Status: AC
  Administered 2024-04-05: 40 meq via ORAL
  Filled 2024-04-05: qty 2

## 2024-04-05 MED ORDER — NOREPINEPHRINE 4 MG/250ML-% IV SOLN
0.0000 ug/min | INTRAVENOUS | Status: DC
  Administered 2024-04-06: 2 ug/min via INTRAVENOUS
  Filled 2024-04-05: qty 250

## 2024-04-05 NOTE — Progress Notes (Signed)
 Inpatient Rehab Admissions Coordinator:   Met with pt at bedside.  State family working to pull together 24/7.  Reviewed ELOS of 4 weeks, w/c level goals with assist for mobility, ADLs, bowel/bladder, etc.  Reviewed cost of rehab and Medicaid pending process.  I left a message for spouse Lyle to discuss caregiver supports.   Reche Lowers, PT, DPT Admissions Coordinator [phone] 04/05/24  4:15 PM
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
## 2024-04-05 NOTE — TOC Progression Note (Signed)
 Transition of Care [facility]) - Progression Note    Patient Details  Name: Tyrone Mckinney MRN: [id number] Date of Birth: 10-21-1980  [facility] CM/SW Contact  Carmelita FORBES [place], LCSW Phone Number: 04/05/2024, 12:28 PM  Clinical Narrative:    CIR Representative to follow up today to confirm eligibility (dispo plan).   Expected Discharge Plan: IP Rehab Facility Barriers to Discharge: Continued Medical Work up               Expected Discharge Plan and Services   Discharge Planning Services: CM Consult, Other - See comment (Financial Counseling)   Living arrangements for the past 2 months: Single Family Home                                       Social Drivers of Health (SDOH) Interventions SDOH Screenings   Food Insecurity: Patient Unable To Answer (04/03/2024)  Housing: Patient Unable To Answer (04/03/2024)  Transportation Needs: Patient Unable To Answer (04/03/2024)  Utilities: Patient Unable To Answer (04/03/2024)  Tobacco Use: Unknown (04/01/2024)    Readmission Risk Interventions     No data to display
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
## 2024-04-05 NOTE — Plan of Care (Signed)
 Will remain free from infection Add Today at 1041 - Progressing by Celestia Rian BIRCH, RN Add  Today at 1041 - Progressing by Celestia Rian BIRCH, RN Add Diagnostic test results will improve Add Today at 1041 - Progressing by Celestia Rian BIRCH, RN Add Respiratory complications will improve Add Today at 1041 - Progressing by Celestia Rian BIRCH, RN Add  Today at 1041 - Progressing by Celestia Rian BIRCH, RN Add Cardiovascular complication will be avoided Add Today at 1041 - Progressing by Celestia Rian BIRCH, RN Add  Today at 1041 - Progressing by Celestia Rian BIRCH, RN
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
## 2024-04-06 MED ORDER — POLYETHYLENE GLYCOL 3350 17 G PO PACK: 17.0000 g | PACK | Freq: Every day | ORAL | Status: DC | PRN
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
## 2024-04-06 NOTE — Plan of Care (Signed)
  Problem: Education: Goal: Knowledge of General Education information will improve Description: Including pain rating scale, medication(s)/side effects and non-pharmacologic comfort measures Outcome: Progressing   Problem: Health Behavior/Discharge Planning: Goal: Ability to manage health-related needs will improve Outcome: Progressing   Problem: Clinical Measurements: Goal: Will remain free from infection Outcome: Progressing Goal: Diagnostic test results will improve Outcome: Progressing Goal: Respiratory complications will improve Outcome: Progressing Goal: Cardiovascular complication will be avoided Outcome: Progressing   Problem: Activity: Goal: Risk for activity intolerance will decrease Outcome: Progressing   Problem: Nutrition: Goal: Adequate nutrition will be maintained Outcome: Progressing   Problem: Coping: Goal: Level of anxiety will decrease Outcome: Progressing

## 2024-04-07 NOTE — Plan of Care (Signed)
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
## 2024-04-08 LAB — CBC
HCT: 27.8 % — ABNORMAL LOW (ref 39.0–52.0)
Hemoglobin: 9.5 g/dL — ABNORMAL LOW (ref 13.0–17.0)
MCH: 32 pg (ref 26.0–34.0)
MCHC: 34.2 g/dL (ref 30.0–36.0)
MCV: 93.6 fL (ref 80.0–100.0)
Platelets: 478 K/uL — ABNORMAL HIGH (ref 150–400)
RBC: 2.97 MIL/uL — ABNORMAL LOW (ref 4.22–5.81)
RDW: 13.4 % (ref 11.5–15.5)
WBC: 8.6 K/uL (ref 4.0–10.5)
nRBC: 0 % (ref 0.0–0.2)

## 2024-04-08 LAB — BASIC METABOLIC PANEL WITH GFR
Anion gap: 8 (ref 5–15)
BUN: 13 mg/dL (ref 6–20)
CO2: 26 mmol/L (ref 22–32)
Calcium: 8.8 mg/dL — ABNORMAL LOW (ref 8.9–10.3)
Chloride: 102 mmol/L (ref 98–111)
Creatinine, Ser: 0.84 mg/dL (ref 0.61–1.24)
GFR, Estimated: >60 mL/min (ref >60)
Glucose, Bld: 115 mg/dL — ABNORMAL HIGH (ref 70–99)
Potassium: 4.5 mmol/L (ref 3.5–5.1)
Sodium: 136 mmol/L (ref 135–145)

## 2024-04-08 LAB — TROPONIN I (HIGH SENSITIVITY): Troponin I (High Sensitivity): 9 ng/L

## 2024-04-08 MED ORDER — ALUM & MAG HYDROXIDE-SIMETH 200-200-20 MG/5ML PO SUSP
30.0000 mL | Freq: Four times a day (QID) | ORAL | Status: DC | PRN
  Administered 2024-04-08: 30 mL via ORAL
  Filled 2024-04-08: qty 30

## 2024-04-08 NOTE — Progress Notes (Signed)
 IP rehab admissions - I spoke with Lyle, ex-wife.  Between her and her daughters, they are changing their schedule to accommodate patient needing and receiving 24/6 supervision/care once he is discharged from the hospital.  His tolerance of therapies needs to improve such that he can sit up an hour at a time and maintain a stable BP during therapies.  Will await medical readiness prior to rehab admission.  [phone]
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
## 2024-04-08 NOTE — Progress Notes (Signed)
..  Trauma Event Note    Reason for Call :  Notified by Primary RN Lyle pt c/o some indigestion/belching.  Pt did report some chest tightness thought to be related to indigestion. No change in vitals No recent labs or EKG available.  CBC,BMP,TROP and EKG ordered Primary RN updated on POC   Last imported Vital Signs BP 114/63   Pulse 68   Temp 99.3 F (37.4 C) (Oral)   Resp 19   Ht 6' 1 (1.854 m)   Wt 159 lb 6.3 oz (72.3 kg)   SpO2 93%   BMI 21.03 kg/m   Trending CBC Recent Labs    04/05/24 0603  WBC 7.2  HGB 8.4*  HCT 24.0*  PLT 314    Trending Coag's No results for input(s): APTT, INR in the last 72 hours.  Trending BMET Recent Labs    04/05/24 0603  NA 138  K 3.6  CL 105  CO2 28  BUN 21*  CREATININE 0.87  GLUCOSE 115*      Salihah Peckham Dee  Trauma Response RN  Please call TRN at [phone] for further assistance.

## 2024-04-08 NOTE — TOC Progression Note (Signed)
 Transition of Care [facility]) - Progression Note    Patient Details  Name: Tyrone Mckinney MRN: [id number] Date of Birth: 1981-02-18  [facility] of [facility]) CM/SW Contact  Carmelita FORBES Carbon, LCSW Phone Number: 04/08/2024, 10:36 AM  Clinical Narrative:    ICM continues to follow. Plan for CIR when medically ready.   Expected Discharge Plan: IP Rehab Facility Barriers to Discharge: Continued Medical Work up               Expected Discharge Plan and Services   Discharge Planning Services: CM Consult, Other - See comment (Financial Counseling)   Living arrangements for the past 2 months: Single Family Home                                       Social Drivers of Health (SDOH) Interventions SDOH Screenings   Food Insecurity: Patient Unable To Answer (04/03/2024)  Housing: Patient Unable To Answer (04/03/2024)  Transportation Needs: Patient Unable To Answer (04/03/2024)  Utilities: Patient Unable To Answer (04/03/2024)  Tobacco Use: Unknown (04/01/2024)    Readmission Risk Interventions     No data to display

## 2024-04-08 NOTE — Progress Notes (Addendum)
 Pt reporting chest tightness and indigestion. No complaints of SOB or other symptoms.  No change in vitals.  Chest tightness did decrease after pt belched.  TRN notified.  Standing orders for PRN Maalox ordered and given to pt.  EKG completed, results placed in chart.  Results of EKG read NSR; Normal ECG.  TRN updated.  Lab orders placed.
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
## 2024-04-09 MED ORDER — DIPHENHYDRAMINE HCL 25 MG PO CAPS: 25.0000 mg | ORAL_CAPSULE | Freq: Four times a day (QID) | ORAL | Status: DC | PRN
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
## 2024-04-09 NOTE — Progress Notes (Signed)
 Inpatient Rehab Admissions Coordinator:   Following for my colleague, Lugene Ropes.  Pt able to sit up in chair >1 hour yesterday and I will discuss with rehab MDs potential for admit later this week.    Reche Lowers, PT, DPT Admissions Coordinator [phone] 04/09/24  1:52 PM
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
## 2024-04-10 NOTE — Plan of Care (Signed)
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
## 2024-04-10 NOTE — Progress Notes (Signed)
 Inpatient Rehab Admissions Coordinator:   Met with pt at bedside while he was working with therapy at EOB.  He looks great.  Will hopefully be able to admit him late this week or early next week pending bed availability on CIR.    Reche Lowers, PT, DPT Admissions Coordinator [phone] 04/10/24  12:11 PM

## 2024-04-11 MED ORDER — ORAL CARE MOUTH RINSE: 15.0000 mL | OROMUCOSAL | Status: DC | PRN

## 2024-04-11 MED ORDER — ORAL CARE MOUTH RINSE
15.0000 mL | OROMUCOSAL | Status: DC
  Administered 2024-04-11 – 2024-04-12 (×5): 15 mL via OROMUCOSAL
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
## 2024-04-11 NOTE — Progress Notes (Signed)
   04/11/24 1059  Spiritual Encounters  Type of Visit Initial  Care provided to: Patient  Referral source Clinical staff  Reason for visit Advance directives  OnCall Visit No  Spiritual Framework  Presenting Themes Impactful experiences and emotions  Community/Connection Family  Patient Stress Factors Health changes  Family Stress Factors None identified  Advance Directives (For Healthcare)  Does Patient Have a Medical Advance Directive? No  Would patient like information on creating a medical advance directive? No - Patient declined   Chaplain met wit pt to discuss advance directives. Pt stated that at this time his brother , Johndavid Geralds), is designated to make healthcare decision on contact number [phone].  Chaplain offered emotional support and informed the Pt that chaplain services remain available for further assistance as needed.
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
## 2024-04-11 NOTE — Progress Notes (Signed)
 Inpatient Rehab Admissions Coordinator:   Spoke to pt's spouse on the phone to review progress towards CIR.  We reviewed again recommendations for ~4 week LOS, w/c level goals with physical assist for all mobility, and ADLs.  We reviewed some AE that could be used to increase pt's independence with bathing/dressing/grooming/feeding.  We reviewed need for ramp for home entry.  Lyle confirms that herself and their 2 adult daughters will provide care for patient at discharge and he will return to their home.  He also has a brother that will assist.  We reviewed medicaid pending and I explained cost of care and how that worked with [facility] pending.  She is in agreement to proceed with admit when bed available.  I will continue to follow.   Reche Lowers, PT, DPT Admissions Coordinator [phone] 04/11/24  3:40 PM

## 2024-04-12 ENCOUNTER — Encounter (HOSPITAL_COMMUNITY): Payer: Self-pay | Admitting: Physical Medicine and Rehabilitation

## 2024-04-12 ENCOUNTER — Inpatient Hospital Stay (HOSPITAL_COMMUNITY)
Admission: AD | Admit: 2024-04-12 | Discharge: 2024-05-14 | DRG: 052 | Disposition: A | Discharge status: Home / Self Care | Payer: Self-pay | Source: Intra-hospital | Attending: Physical Medicine and Rehabilitation | Admitting: Physical Medicine and Rehabilitation

## 2024-04-12 ENCOUNTER — Other Ambulatory Visit: Payer: Self-pay

## 2024-04-12 DIAGNOSIS — G904 Autonomic dysreflexia: Secondary | ICD-10-CM | POA: Diagnosis not present

## 2024-04-12 DIAGNOSIS — S0181XD Laceration without foreign body of other part of head, subsequent encounter: Secondary | ICD-10-CM

## 2024-04-12 DIAGNOSIS — L89153 Pressure ulcer of sacral region, stage 3: Secondary | ICD-10-CM | POA: Diagnosis not present

## 2024-04-12 DIAGNOSIS — S22058D Other fracture of T5-T6 vertebra, subsequent encounter for fracture with routine healing: Secondary | ICD-10-CM

## 2024-04-12 DIAGNOSIS — Z79899 Other long term (current) drug therapy: Secondary | ICD-10-CM

## 2024-04-12 DIAGNOSIS — M50222 Other cervical disc displacement at C5-C6 level: Secondary | ICD-10-CM | POA: Diagnosis present

## 2024-04-12 DIAGNOSIS — F515 Nightmare disorder: Secondary | ICD-10-CM | POA: Diagnosis not present

## 2024-04-12 DIAGNOSIS — Z79891 Long term (current) use of opiate analgesic: Secondary | ICD-10-CM

## 2024-04-12 DIAGNOSIS — L89626 Pressure-induced deep tissue damage of left heel: Secondary | ICD-10-CM | POA: Diagnosis not present

## 2024-04-12 DIAGNOSIS — I951 Orthostatic hypotension: Secondary | ICD-10-CM | POA: Diagnosis present

## 2024-04-12 DIAGNOSIS — T07XXXA Unspecified multiple injuries, initial encounter: Secondary | ICD-10-CM | POA: Diagnosis present

## 2024-04-12 DIAGNOSIS — S22068D Other fracture of T7-T8 thoracic vertebra, subsequent encounter for fracture with routine healing: Secondary | ICD-10-CM

## 2024-04-12 DIAGNOSIS — G8252 Quadriplegia, C1-C4 incomplete: Secondary | ICD-10-CM | POA: Diagnosis present

## 2024-04-12 DIAGNOSIS — I10 Essential (primary) hypertension: Secondary | ICD-10-CM | POA: Diagnosis present

## 2024-04-12 DIAGNOSIS — D62 Acute posthemorrhagic anemia: Secondary | ICD-10-CM | POA: Diagnosis present

## 2024-04-12 DIAGNOSIS — S0101XD Laceration without foreign body of scalp, subsequent encounter: Secondary | ICD-10-CM

## 2024-04-12 DIAGNOSIS — Z7982 Long term (current) use of aspirin: Secondary | ICD-10-CM

## 2024-04-12 DIAGNOSIS — T8141XA Infection following a procedure, superficial incisional surgical site, initial encounter: Secondary | ICD-10-CM | POA: Diagnosis not present

## 2024-04-12 DIAGNOSIS — S27331D Laceration of lung, unilateral, subsequent encounter: Secondary | ICD-10-CM

## 2024-04-12 DIAGNOSIS — M25562 Pain in left knee: Secondary | ICD-10-CM | POA: Diagnosis not present

## 2024-04-12 DIAGNOSIS — M4802 Spinal stenosis, cervical region: Secondary | ICD-10-CM | POA: Diagnosis present

## 2024-04-12 DIAGNOSIS — S52612D Displaced fracture of left ulna styloid process, subsequent encounter for closed fracture with routine healing: Secondary | ICD-10-CM

## 2024-04-12 DIAGNOSIS — S140XXS Concussion and edema of cervical spinal cord, sequela: Secondary | ICD-10-CM | POA: Diagnosis not present

## 2024-04-12 DIAGNOSIS — N319 Neuromuscular dysfunction of bladder, unspecified: Secondary | ICD-10-CM | POA: Diagnosis present

## 2024-04-12 DIAGNOSIS — Z7151 Drug abuse counseling and surveillance of drug abuser: Secondary | ICD-10-CM

## 2024-04-12 DIAGNOSIS — S22078D Other fracture of T9-T10 vertebra, subsequent encounter for fracture with routine healing: Secondary | ICD-10-CM

## 2024-04-12 DIAGNOSIS — L0211 Cutaneous abscess of neck: Secondary | ICD-10-CM | POA: Diagnosis not present

## 2024-04-12 DIAGNOSIS — Y838 Other surgical procedures as the cause of abnormal reaction of the patient, or of later complication, without mention of misadventure at the time of the procedure: Secondary | ICD-10-CM | POA: Diagnosis not present

## 2024-04-12 DIAGNOSIS — F1310 Sedative, hypnotic or anxiolytic abuse, uncomplicated: Secondary | ICD-10-CM | POA: Diagnosis present

## 2024-04-12 DIAGNOSIS — K592 Neurogenic bowel, not elsewhere classified: Secondary | ICD-10-CM | POA: Diagnosis present

## 2024-04-12 DIAGNOSIS — E882 Lipomatosis, not elsewhere classified: Secondary | ICD-10-CM | POA: Diagnosis present

## 2024-04-12 DIAGNOSIS — Z981 Arthrodesis status: Secondary | ICD-10-CM

## 2024-04-12 DIAGNOSIS — M4800 Spinal stenosis, site unspecified: Secondary | ICD-10-CM | POA: Insufficient documentation

## 2024-04-12 DIAGNOSIS — F1210 Cannabis abuse, uncomplicated: Secondary | ICD-10-CM | POA: Diagnosis present

## 2024-04-12 DIAGNOSIS — F430 Acute stress reaction: Secondary | ICD-10-CM | POA: Diagnosis not present

## 2024-04-12 DIAGNOSIS — I358 Other nonrheumatic aortic valve disorders: Secondary | ICD-10-CM | POA: Diagnosis present

## 2024-04-12 DIAGNOSIS — S29092D Other injury of muscle and tendon of back wall of thorax, subsequent encounter: Secondary | ICD-10-CM

## 2024-04-12 DIAGNOSIS — G8220 Paraplegia, unspecified: Secondary | ICD-10-CM | POA: Insufficient documentation

## 2024-04-12 DIAGNOSIS — S14104D Unspecified injury at C4 level of cervical spinal cord, subsequent encounter: Secondary | ICD-10-CM

## 2024-04-12 DIAGNOSIS — Y92410 Unspecified street and highway as the place of occurrence of the external cause: Secondary | ICD-10-CM | POA: Diagnosis not present

## 2024-04-12 DIAGNOSIS — F1410 Cocaine abuse, uncomplicated: Secondary | ICD-10-CM | POA: Diagnosis present

## 2024-04-12 DIAGNOSIS — S14109S Unspecified injury at unspecified level of cervical spinal cord, sequela: Principal | ICD-10-CM

## 2024-04-12 DIAGNOSIS — R252 Cramp and spasm: Secondary | ICD-10-CM | POA: Insufficient documentation

## 2024-04-12 DIAGNOSIS — F54 Psychological and behavioral factors associated with disorders or diseases classified elsewhere: Secondary | ICD-10-CM

## 2024-04-12 DIAGNOSIS — I340 Nonrheumatic mitral (valve) insufficiency: Secondary | ICD-10-CM | POA: Diagnosis present

## 2024-04-12 DIAGNOSIS — S140XXD Concussion and edema of cervical spinal cord, subsequent encounter: Secondary | ICD-10-CM | POA: Diagnosis present

## 2024-04-12 DIAGNOSIS — R7401 Elevation of levels of liver transaminase levels: Secondary | ICD-10-CM | POA: Insufficient documentation

## 2024-04-12 DIAGNOSIS — G479 Sleep disorder, unspecified: Secondary | ICD-10-CM | POA: Diagnosis not present

## 2024-04-12 DIAGNOSIS — S27322D Contusion of lung, bilateral, subsequent encounter: Secondary | ICD-10-CM

## 2024-04-12 DIAGNOSIS — M4804 Spinal stenosis, thoracic region: Secondary | ICD-10-CM | POA: Diagnosis present

## 2024-04-12 DIAGNOSIS — S52612A Displaced fracture of left ulna styloid process, initial encounter for closed fracture: Secondary | ICD-10-CM | POA: Insufficient documentation

## 2024-04-12 DIAGNOSIS — I3481 Nonrheumatic mitral (valve) annulus calcification: Secondary | ICD-10-CM | POA: Diagnosis present

## 2024-04-12 DIAGNOSIS — M62830 Muscle spasm of back: Secondary | ICD-10-CM | POA: Diagnosis not present

## 2024-04-12 DIAGNOSIS — M5021 Other cervical disc displacement,  high cervical region: Secondary | ICD-10-CM | POA: Diagnosis present

## 2024-04-12 LAB — CREATININE, SERUM
Creatinine, Ser: 0.66 mg/dL (ref 0.61–1.24)
GFR, Estimated: >60 mL/min (ref >60)

## 2024-04-12 MED ORDER — TIZANIDINE HCL 4 MG PO TABS
2.0000 mg | ORAL_TABLET | Freq: Three times a day (TID) | ORAL | Status: DC | PRN
  Administered 2024-04-13 – 2024-04-21 (×5): 2 mg via ORAL
  Filled 2024-04-12 (×4): qty 1

## 2024-04-12 MED ORDER — TRAZODONE HCL 50 MG PO TABS: 25.0000 mg | ORAL_TABLET | Freq: Every evening | ORAL | Status: DC | PRN

## 2024-04-12 MED ORDER — FLEET ENEMA RE ENEM: 1.0000 | ENEMA | Freq: Once | RECTAL | Status: DC | PRN

## 2024-04-12 MED ORDER — ENSURE PLUS HIGH PROTEIN PO LIQD
237.0000 mL | Freq: Two times a day (BID) | ORAL | Status: DC
  Administered 2024-04-13 – 2024-04-14 (×4): 237 mL via ORAL

## 2024-04-12 MED ORDER — ALUM & MAG HYDROXIDE-SIMETH 200-200-20 MG/5ML PO SUSP
30.0000 mL | ORAL | Status: DC | PRN
Start: 2024-04-12 — End: 2024-05-14

## 2024-04-12 MED ORDER — PROSOURCE PLUS PO LIQD
30.0000 mL | Freq: Two times a day (BID) | ORAL | Status: DC
  Administered 2024-04-13 – 2024-05-12 (×53): 30 mL via ORAL
  Filled 2024-04-12 (×49): qty 30

## 2024-04-12 MED ORDER — ASPIRIN 81 MG PO TBEC
81.0000 mg | DELAYED_RELEASE_TABLET | Freq: Every day | ORAL | Status: DC
  Administered 2024-04-13 – 2024-05-14 (×32): 81 mg via ORAL
  Filled 2024-04-12 (×33): qty 1

## 2024-04-12 MED ORDER — ORAL CARE MOUTH RINSE
15.0000 mL | OROMUCOSAL | Status: DC
  Administered 2024-04-12 – 2024-05-14 (×95): 15 mL via OROMUCOSAL

## 2024-04-12 MED ORDER — ADULT MULTIVITAMIN W/MINERALS CH
1.0000 | ORAL_TABLET | Freq: Every day | ORAL | Status: DC
  Administered 2024-04-13 – 2024-05-14 (×32): 1 via ORAL
  Filled 2024-04-12 (×32): qty 1

## 2024-04-12 MED ORDER — SORBITOL 70 % SOLN
30.0000 mL | Freq: Every day | Status: DC | PRN
  Filled 2024-04-12: qty 30

## 2024-04-12 MED ORDER — SENNOSIDES-DOCUSATE SODIUM 8.6-50 MG PO TABS
2.0000 | ORAL_TABLET | Freq: Every day | ORAL | Status: DC
  Administered 2024-04-13 – 2024-04-19 (×7): 2 via ORAL
  Filled 2024-04-12 (×7): qty 2

## 2024-04-12 MED ORDER — GABAPENTIN 300 MG PO CAPS
600.0000 mg | ORAL_CAPSULE | Freq: Three times a day (TID) | ORAL | Status: DC
  Administered 2024-04-12 – 2024-05-14 (×94): 600 mg via ORAL
  Filled 2024-04-12 (×14): qty 2
  Filled 2024-04-12: qty 6
  Filled 2024-04-12 (×80): qty 2

## 2024-04-12 MED ORDER — TIZANIDINE HCL 4 MG PO TABS
2.0000 mg | ORAL_TABLET | Freq: Three times a day (TID) | ORAL | Status: DC
  Administered 2024-04-12 – 2024-04-23 (×31): 2 mg via ORAL
  Filled 2024-04-12 (×33): qty 1

## 2024-04-12 MED ORDER — MIDODRINE HCL 5 MG PO TABS
20.0000 mg | ORAL_TABLET | Freq: Three times a day (TID) | ORAL | Status: DC
  Administered 2024-04-13 – 2024-05-14 (×92): 20 mg via ORAL
  Filled 2024-04-12 (×92): qty 4

## 2024-04-12 MED ORDER — BISACODYL 10 MG RE SUPP
10.0000 mg | Freq: Every day | RECTAL | Status: DC
  Administered 2024-04-14 – 2024-05-13 (×28): 10 mg via RECTAL
  Filled 2024-04-12 (×32): qty 1

## 2024-04-12 MED ORDER — METHOCARBAMOL 500 MG PO TABS
1000.0000 mg | ORAL_TABLET | Freq: Three times a day (TID) | ORAL | Status: DC
  Administered 2024-04-12 – 2024-04-18 (×17): 1000 mg via ORAL
  Filled 2024-04-12 (×17): qty 2

## 2024-04-12 MED ORDER — ZINC SULFATE 220 (50 ZN) MG PO CAPS
220.0000 mg | ORAL_CAPSULE | Freq: Every day | ORAL | Status: AC
  Administered 2024-04-12 – 2024-04-25 (×14): 220 mg via ORAL
  Filled 2024-04-12 (×14): qty 1

## 2024-04-12 MED ORDER — ENOXAPARIN SODIUM 30 MG/0.3ML IJ SOSY
30.0000 mg | PREFILLED_SYRINGE | Freq: Two times a day (BID) | INTRAMUSCULAR | Status: DC
  Administered 2024-04-12 – 2024-05-14 (×64): 30 mg via SUBCUTANEOUS
  Filled 2024-04-12 (×64): qty 0.3

## 2024-04-12 MED ORDER — OXYCODONE HCL 5 MG PO TABS
10.0000 mg | ORAL_TABLET | ORAL | Status: DC | PRN
  Administered 2024-04-12: 10 mg via ORAL
  Administered 2024-04-13 – 2024-04-24 (×49): 15 mg via ORAL
  Administered 2024-04-24 (×2): 10 mg via ORAL
  Administered 2024-04-25 (×2): 15 mg via ORAL
  Administered 2024-04-25: 10 mg via ORAL
  Administered 2024-04-25: 15 mg via ORAL
  Administered 2024-04-25: 10 mg via ORAL
  Administered 2024-04-26 – 2024-05-06 (×46): 15 mg via ORAL
  Administered 2024-05-07: 10 mg via ORAL
  Administered 2024-05-07 – 2024-05-14 (×30): 15 mg via ORAL
  Filled 2024-04-12 (×30): qty 3
  Filled 2024-04-12: qty 2
  Filled 2024-04-12 (×3): qty 3
  Filled 2024-04-12: qty 2
  Filled 2024-04-12 (×15): qty 3
  Filled 2024-04-12: qty 2
  Filled 2024-04-12 (×20): qty 3
  Filled 2024-04-12: qty 2
  Filled 2024-04-12 (×27): qty 3
  Filled 2024-04-12: qty 2
  Filled 2024-04-12 (×3): qty 3
  Filled 2024-04-12: qty 2
  Filled 2024-04-12 (×16): qty 3
  Filled 2024-04-12: qty 2
  Filled 2024-04-12 (×16): qty 3

## 2024-04-12 MED ORDER — DULOXETINE HCL 30 MG PO CPEP
30.0000 mg | ORAL_CAPSULE | Freq: Every day | ORAL | Status: DC
  Administered 2024-04-12 – 2024-04-14 (×3): 30 mg via ORAL
  Filled 2024-04-12 (×3): qty 1

## 2024-04-12 MED ORDER — VITAMIN C 500 MG PO TABS
1000.0000 mg | ORAL_TABLET | Freq: Every day | ORAL | Status: DC
  Administered 2024-04-12 – 2024-05-14 (×33): 1000 mg via ORAL
  Filled 2024-04-12 (×33): qty 2

## 2024-04-12 MED ORDER — ENOXAPARIN SODIUM 30 MG/0.3ML IJ SOSY: 30.0000 mg | PREFILLED_SYRINGE | Freq: Two times a day (BID) | INTRAMUSCULAR | Status: DC

## 2024-04-12 MED ORDER — ORAL CARE MOUTH RINSE: 15.0000 mL | OROMUCOSAL | Status: DC | PRN

## 2024-04-12 MED ORDER — POLYETHYLENE GLYCOL 3350 17 G PO PACK
17.0000 g | PACK | Freq: Every day | ORAL | Status: DC | PRN
  Filled 2024-04-12: qty 1

## 2024-04-12 MED ORDER — ATOMOXETINE HCL 25 MG PO CAPS
25.0000 mg | ORAL_CAPSULE | Freq: Every day | ORAL | Status: DC
  Administered 2024-04-13 – 2024-05-14 (×32): 25 mg via ORAL
  Filled 2024-04-12 (×32): qty 1

## 2024-04-12 MED ORDER — GUAIFENESIN-DM 100-10 MG/5ML PO SYRP: 5.0000 mL | ORAL_SOLUTION | Freq: Four times a day (QID) | ORAL | Status: DC | PRN

## 2024-04-12 MED ORDER — ACETAMINOPHEN 500 MG PO TABS
1000.0000 mg | ORAL_TABLET | Freq: Four times a day (QID) | ORAL | Status: DC
  Administered 2024-04-12: 1000 mg via ORAL
  Filled 2024-04-12 (×2): qty 2

## 2024-04-12 MED ORDER — POLYETHYLENE GLYCOL 3350 17 G PO PACK
17.0000 g | PACK | Freq: Two times a day (BID) | ORAL | Status: DC
  Administered 2024-04-13 – 2024-04-25 (×20): 17 g via ORAL
  Filled 2024-04-12 (×23): qty 1

## 2024-04-12 NOTE — TOC Transition Note (Signed)
 Transition of Care [facility]) - Discharge Note   Patient Details  Name: Tyrone Mckinney MRN: [id number] Date of Birth: [birth year]  [facility] CM/SW Contact:  Josepha Mliss HERO, RN Phone Number: 04/12/2024, 12:18 PM   Clinical Narrative:    Patient medically stable for discharge to [facility] inpatient rehab today, and bed available.  Plan dc to CIR when bed available.    Final next level of care: IP Rehab Facility Barriers to Discharge: Barriers Resolved   Patient Goals and CMS Choice   CMS Medicare.gov Compare Post Acute Care list provided to:: Patient Represenative (must comment) Choice offered to / list presented to : Spouse                          Discharge Plan and Services Additional resources added to the After Visit Summary for     Discharge Planning Services: CM Consult, Other - See comment (Financial Counseling)                                 Social Drivers of Health (SDOH) Interventions SDOH Screenings   Food Insecurity: Patient Unable To Answer (04/03/2024)  Housing: Patient Unable To Answer (04/03/2024)  Transportation Needs: Patient Unable To Answer (04/03/2024)  Utilities: Patient Unable To Answer (04/03/2024)  Tobacco Use: Unknown (04/01/2024)     Readmission Risk Interventions     No data to display         Mliss MICAEL Josepha, RN, BSN  Trauma/Neuro ICU Case Manager [phone]

## 2024-04-12 NOTE — TOC Transition Note (Signed)
 Transition of Care [facility]) - Discharge Note   Patient Details  Name: Tyrone Mckinney MRN: [id number] Date of Birth: 27-Jan-1981  Transition of Care [facility]) CM/SW Contact:  Carmelita FORBES Carbon, LCSW Phone Number: 04/12/2024, 9:35 AM   Clinical Narrative:    Patient is discharging to [facility] today. Caitlin with CIR has updated patient/family.   Final next level of care: IP Rehab Facility Barriers to Discharge: Barriers Resolved   Patient Goals and CMS Choice   CMS Medicare.gov Compare Post Acute Care list provided to:: Patient Represenative (must comment) Choice offered to / list presented to : Spouse      Discharge Placement                       Discharge Plan and Services Additional resources added to the After Visit Summary for     Discharge Planning Services: CM Consult, Other - See comment (Financial Counseling)                                 Social Drivers of Health (SDOH) Interventions SDOH Screenings   Food Insecurity: Patient Unable To Answer (04/03/2024)  Housing: Patient Unable To Answer (04/03/2024)  Transportation Needs: Patient Unable To Answer (04/03/2024)  Utilities: Patient Unable To Answer (04/03/2024)  Tobacco Use: Unknown (04/01/2024)     Readmission Risk Interventions     No data to display
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
## 2024-04-12 NOTE — Progress Notes (Signed)
 Pt with orders to d/c to CIR. Report given to RN all questions answered. Pt and all belongings transported to [address] 02 without incident. Pt daughter at bedside.
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
## 2024-04-12 NOTE — Op Note (Signed)
   Operative Note   Date: 04/12/2024  Procedure: laceration repair, forehead  Pre-op diagnosis: laceration, 8x1x1cm Post-op diagnosis: same  Indication and clinical history: The patient is a 43 y.o. year old male with forehead laceration     Surgeon: Dreama GEANNIE Hanger, MD  Anesthesia: General  Findings:  Specimen: none EBL: <5cc Drains/Implants: none  Disposition: ICU - intubated and critically ill.  Description of procedure: The patient was positioned supine on the operating room table after conculsion of the neurosurgical procedure. The previously placed staples were removed and subcuticular sutures used to close the wound. Sterile dressings were applied. All sponge and instrument counts were correct at the conclusion of the procedure. The patient was transported to the ICU in stable, but critical condition. There were no complications.      Dreama GEANNIE Hanger, MD General and Trauma Surgery [facility]

## 2024-04-12 NOTE — Progress Notes (Signed)
 Inpatient Rehab Admissions Coordinator:   I have a bed for Mr. Villard to admit to CIR today.  Vertell Pringle, PA-C, in agreement and TOC aware.  I will notify pt/family and make arrangements.   Reche Lowers, PT, DPT Admissions Coordinator [phone] 04/12/24  10:16 AM
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
## 2024-04-13 DIAGNOSIS — R7401 Elevation of levels of liver transaminase levels: Secondary | ICD-10-CM

## 2024-04-13 DIAGNOSIS — S14109S Unspecified injury at unspecified level of cervical spinal cord, sequela: Secondary | ICD-10-CM

## 2024-04-13 DIAGNOSIS — I959 Hypotension, unspecified: Secondary | ICD-10-CM

## 2024-04-13 LAB — COMPREHENSIVE METABOLIC PANEL WITH GFR
ALT: 153 U/L — ABNORMAL HIGH (ref 0–44)
AST: 58 U/L — ABNORMAL HIGH (ref 15–41)
Albumin: 2.8 g/dL — ABNORMAL LOW (ref 3.5–5.0)
Alkaline Phosphatase: 286 U/L — ABNORMAL HIGH (ref 38–126)
Anion gap: 10 (ref 5–15)
BUN: 16 mg/dL (ref 6–20)
CO2: 26 mmol/L (ref 22–32)
Calcium: 9 mg/dL (ref 8.9–10.3)
Chloride: 102 mmol/L (ref 98–111)
Creatinine, Ser: 0.84 mg/dL (ref 0.61–1.24)
GFR, Estimated: >60 mL/min (ref >60)
Glucose, Bld: 99 mg/dL (ref 70–99)
Potassium: 4.2 mmol/L (ref 3.5–5.1)
Sodium: 138 mmol/L (ref 135–145)
Total Bilirubin: 0.4 mg/dL (ref 0.0–1.2)
Total Protein: 6.7 g/dL (ref 6.5–8.1)

## 2024-04-13 LAB — CBC WITH DIFFERENTIAL/PLATELET
Abs Immature Granulocytes: 0.03 K/uL (ref 0.00–0.07)
Basophils Absolute: 0 K/uL (ref 0.0–0.1)
Basophils Relative: 0 %
Eosinophils Absolute: 0.2 K/uL (ref 0.0–0.5)
Eosinophils Relative: 3 %
HCT: 28.5 % — ABNORMAL LOW (ref 39.0–52.0)
Hemoglobin: 9.6 g/dL — ABNORMAL LOW (ref 13.0–17.0)
Immature Granulocytes: 0 %
Lymphocytes Relative: 21 %
Lymphs Abs: 1.6 K/uL (ref 0.7–4.0)
MCH: 31.1 pg (ref 26.0–34.0)
MCHC: 33.7 g/dL (ref 30.0–36.0)
MCV: 92.2 fL (ref 80.0–100.0)
Monocytes Absolute: 0.7 K/uL (ref 0.1–1.0)
Monocytes Relative: 9 %
Neutro Abs: 4.9 K/uL (ref 1.7–7.7)
Neutrophils Relative %: 67 %
Platelets: 542 K/uL — ABNORMAL HIGH (ref 150–400)
RBC: 3.09 MIL/uL — ABNORMAL LOW (ref 4.22–5.81)
RDW: 13.1 % (ref 11.5–15.5)
WBC: 7.4 K/uL (ref 4.0–10.5)
nRBC: 0 % (ref 0.0–0.2)

## 2024-04-13 MED ORDER — ACETAMINOPHEN 325 MG PO TABS
650.0000 mg | ORAL_TABLET | Freq: Four times a day (QID) | ORAL | Status: DC
  Administered 2024-04-13 – 2024-04-18 (×17): 650 mg via ORAL
  Filled 2024-04-13 (×18): qty 2

## 2024-04-13 NOTE — Progress Notes (Signed)
 PROGRESS NOTE   Subjective/Complaints:  Pt doing well, slept well, denies pain. LBM last night/early this morning. Urinating fine in condom cath, but apparently not suctioning well-- wants to try to use urinal with assistance. Gets tingling sensation when he needs to urinate. States he's been getting bladder scans but no caths-- no PVRs documented. No other complaints or concerns.   ROS: as per HPI. Denies CP, SOB, abd pain, N/V/D/C, or any other complaints at this time.     Objective:   No results found. Recent Labs    04/13/24 0540  WBC 7.4  HGB 9.6*  HCT 28.5*  PLT 542*   Recent Labs    04/12/24 1859 04/13/24 0540  NA  --  138  K  --  4.2  CL  --  102  CO2  --  26  GLUCOSE  --  99  BUN  --  16  CREATININE 0.66 0.84  CALCIUM   --  9.0        Intake/Output Summary (Last 24 hours) at 04/13/2024 1139 Last data filed at 04/13/2024 0519 Gross per 24 hour  Intake --  Output 750 ml  Net -750 ml        Physical Exam: Vital Signs Blood pressure 93/63, pulse 69, temperature 98.1 F (36.7 C), temperature source Oral, resp. rate 19, height 6' 4 (1.93 m), weight 88.9 kg, SpO2 98%.  Constitutional: No distress . Vital signs reviewed. HEENT: NCAT, EOMI, oral membranes moist, scar mid forehead, staples out Neck: supple, cervical incision c/d/I and covered Cardiovascular: RRR without m/r/g appreciated. No JVD    Respiratory/Chest: CTA Bilaterally without wheezes or rales. Normal effort    GI/Abdomen: soft, +BS throughout, non-tender, non-distended GU: no condom cath in place Ext: no clubbing, cyanosis, or edema, LUE wrist splint Psych: pleasant and cooperative Skin: dry, warm, bruises   PRIOR EXAMS: Eyes:     Comments: Right scleral hemorrhage  Genitourinary:    Comments: Incontinent of urine, condom loose Neurological:     Mental Status: He is alert.     Comments: Alert and oriented x 3. Normal insight  and awareness. Intact Memory. Normal language and speech. Cranial nerve exam unremarkable. MMT: RUE: delt 2+/5, biceps 3- to 3/5, triceps, wrist and HI all 0/5.  LUE: delt 1/5, biceps 2/5, triceps, wrist, HI all 0/5. RLE: 1/5 HE and HAD, 0/5 everywhere else. LLE: 2/5 HF, HAD and KE, ADF/PF 2+/5. Sensory exam diminished below C5 on right and ~C6 on left. 1/2 sensation in trunk and both LE, as well as inguinal/rectal area. Sensed sl more in LLE. DTR's 1+ in BUE and 3+ in BLE. Pt with significant extensor and hip adductor tone in both legs. 4-5 beats of clonus RLE and 2-3 in LLE.     Assessment/Plan: 1. Functional deficits which require 3+ hours per day of interdisciplinary therapy in a comprehensive inpatient rehab setting. Physiatrist is providing close team supervision and 24 hour management of active medical problems listed below. Physiatrist and rehab team continue to assess barriers to discharge/monitor patient progress toward functional and medical goals  Care Tool:  Bathing        Body parts bathed by helper:  Buttocks, Right upper leg, Left upper leg, Right lower leg, Left lower leg, Face, Right arm, Chest, Left arm, Abdomen, Front perineal area     Bathing assist Assist Level: Dependent - Patient 0%     Upper Body Dressing/Undressing Upper body dressing   What is the patient wearing?: Pull over shirt    Upper body assist Assist Level: Dependent - Patient 0%    Lower Body Dressing/Undressing Lower body dressing      What is the patient wearing?: Underwear/pull up, Incontinence brief     Lower body assist Assist for lower body dressing: Dependent - Patient 0%     Toileting Toileting    Toileting assist Assist for toileting: Dependent - Patient 0%     Transfers Chair/bed transfer  Transfers assist     Chair/bed transfer assist level: Dependent - mechanical lift     Locomotion Ambulation   Ambulation assist              Walk 10 feet  activity   Assist           Walk 50 feet activity   Assist           Walk 150 feet activity   Assist           Walk 10 feet on uneven surface  activity   Assist           Wheelchair     Assist               Wheelchair 50 feet with 2 turns activity    Assist            Wheelchair 150 feet activity     Assist          Blood pressure 93/63, pulse 69, temperature 98.1 F (36.7 C), temperature source Oral, resp. rate 19, height 6' 4 (1.93 m), weight 88.9 kg, SpO2 98%.  Medical Problem List and Plan: 1. 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 Darnella of [facility] on 03/28/24:             -patient may not yet shower             -ELOS/Goals: 28-32 days, mod to max assist goals             -No collar needed per Dr. Darnella             -Bilateral PRAFO's, R WHO.  -I discussed with patient the basic approach  we will be taking with his rehab. He has had some improvement in his ASIA score since he was seen by Dr. Cornelio on 8/18. However, he will still likely require a power wheelchair as his primary means of mobility at discharge and will need significant assistance from family. He seems motivated and willing to work, however.  2.  Antithrombotics: -DVT/anticoagulation:  Pharmaceutical: Lovenox  30mg  BID             -antiplatelet therapy: ASA 81mg  daily 3. Pain Management: Cymbalta  30mg  nightly, Gabapentin  600mg  TID, Tylenol  1000mg  q6h, Robaxin  1000mg  q8h, oxycodone  10-15mg  q4h prn,  -Consider increasing Cymbalta  to 60mg  nightly for improved pain control if needed -Pt developing early hypertonicity in LE's. Will begin scheduling tizanidine  at 2mg  tid to start. Can have  2mg  tid prn as  well -04/13/24 decrease tylenol  650mg  QID d/t transminitis as below 4. Mood/Behavior/Sleep: ego support             -antipsychotic agents: n/a             -Psych consult inpatient, no needs at that time -Pt will need emotional support. Seems to be pretty upbeat. Family sounds supportive. Will have Dr. Corina see pt at some point during admit  5. Neuropsych/cognition: This patient is capable of making decisions on his own behalf. 6. Skin/Wound Care/large forehead laceration: staples removed 04/04/24. Routine skin care.  7. Fluids/Electrolytes/Nutrition: routine I&O, routine labs, continue vitamins/supplements (Ensure plus high protein BID, MVI)             -Regular diet 8. 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 Lester of [facility], continue ASA 81mg  daily 9. Superior endplate compression fx T5, T7, T8, T9, B/L upper thoracic posterior paraspinal muscle injury and edema, interspinous ligament injury T2-3 and possibly T3-4: no intervention needed. Per Dr. Darnella.  10. B/L apical pulmonary contusions, small pulmonary lac RLL: aggressive pulmonary toilet 11. L Ulnar styloid fx: NWB LUE, splint, f/up Dr. Murrell 12. Orthostatic hypotension: continue Midodrine  20mg  TID, Strattera  25mg  daily also started per Dr. Jyl recs with goal perhaps of coming off midodrine . -continue TEDs and abdominal binder -04/13/24 BPs soft but stable, monitor Vitals:   04/12/24 1804 04/12/24 1950 04/13/24 0518  BP: 101/63 (!) 93/59 93/63    13. Neurogenic bowel: on  bowel program: Dulcolax suppository 10mg  daily, Miralax  BID, SenokotS 1 tab BID             -will adjust bowel program to 1800 daily             -senokot-s 2 tabs q am and miralax  BID -04/13/24 LBM overnight, getting onto newly adjusted bowel program today, monitor BMs 14. Neurogenic bladder: continue ISC q6h             -adjust I/O cath schedule to volumes of 300-500 cc -04/13/24 apparently used condom cath last night-- does feel urge to urinate,  no bladder scans documented but pt says they were done but no ISC needed -Reviewed with nursing the plan for him, no condom cath for now, attempt with urinal q4-6h and bladder scan -ISC if scan >250cc 15. Polysubstance abuse: UDS +cocaine, THC, Benzos-- provide counseling 16. 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 F/up outpatient.  17. ABLA: hgb stable 8-9 range, monitor twice weekly  -04/13/24 Hgb 9.6, monitor 18. Transaminitis: -04/13/24 Alk phos 286, AST 58, ALT 153-- could be from shock liver, monitor again on Monday. Decrease tylenol  to 650mg  QID. No abd pain/n/v but consider imaging/work up if those develop.    I spent >108mins performing patient care related activities, including face to face time, documentation time, chart review/med management, discussion of plan with patient and nursing staff, and overall coordination of care.     LOS: 1 days A FACE TO FACE EVALUATION WAS PERFORMED  10 Kent Jaiyla Granados 04/13/2024, 11:39 AM
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
## 2024-04-13 NOTE — Plan of Care (Signed)
  Problem: RH Eating Goal: LTG Patient will perform eating w/assist, cues/equip (OT) Description: LTG: Patient will perform eating with assist, with/without cues using equipment (OT) Flowsheets (Taken 04/13/2024 1508) LTG: Pt will perform eating with assistance level of: Minimal Assistance - Patient > 75%   Problem: RH Bathing Goal: LTG Patient will bathe all body parts with assist levels (OT) Description: LTG: Patient will bathe all body parts with assist levels (OT) Flowsheets (Taken 04/13/2024 1508) LTG: Pt will perform bathing with assistance level/cueing: Moderate Assistance - Patient 50 - 74%   Problem: RH Dressing Goal: LTG Patient will perform upper body dressing (OT) Description: LTG Patient will perform upper body dressing with assist, with/without cues (OT). Flowsheets (Taken 04/13/2024 1508) LTG: Pt will perform upper body dressing with assistance level of: Moderate Assistance - Patient 50 - 74% Goal: LTG Patient will perform lower body dressing w/assist (OT) Description: LTG: Patient will perform lower body dressing with assist, with/without cues in positioning using equipment (OT) Flowsheets (Taken 04/13/2024 1508) LTG: Pt will perform lower body dressing with assistance level of: Moderate Assistance - Patient 50 - 74%   Problem: RH Toileting Goal: LTG Patient will perform toileting task (3/3 steps) with assistance level (OT) Description: LTG: Patient will perform toileting task (3/3 steps) with assistance level (OT)  Flowsheets (Taken 04/13/2024 1508) LTG: Pt will perform toileting task (3/3 steps) with assistance level: Moderate Assistance - Patient 50 - 74%   Problem: RH Functional Use of Upper Extremity Goal: LTG Patient will use RT/LT upper extremity as a (OT) Description: LTG: Patient will use right/left upper extremity as a stabilizer/gross assist/diminished/nondominant/dominant level with assist, with/without cues during functional activity (OT) Flowsheets (Taken  04/13/2024 1508) LTG: Use of upper extremity in functional activities:  LUE as a stabilizer  RUE as gross assist level LTG: Pt will use upper extremity in functional activity with assistance level of: Independent with assistive device   Problem: RH Toilet Transfers Goal: LTG Patient will perform toilet transfers w/assist (OT) Description: LTG: Patient will perform toilet transfers with assist, with/without cues using equipment (OT) Flowsheets (Taken 04/13/2024 1508) LTG: Pt will perform toilet transfers with assistance level of: Moderate Assistance - Patient 50 - 74%   Problem: RH Tub/Shower Transfers Goal: LTG Patient will perform tub/shower transfers w/assist (OT) Description: LTG: Patient will perform tub/shower transfers with assist, with/without cues using equipment (OT) Flowsheets (Taken 04/13/2024 1508) LTG: Pt will perform tub/shower stall transfers with assistance level of: Moderate Assistance - Patient 50 - 74%

## 2024-04-13 NOTE — Progress Notes (Signed)
 Inpatient Rehabilitation Admission Medication Review by a Pharmacist  A complete drug regimen review was completed for this patient to identify any potential clinically significant medication issues.  High Risk Drug Classes Is patient taking? Indication by Medication  Antipsychotic No   Anticoagulant Yes Enoxaparin  - VTE prophylaxis  Antibiotic No   Opioid Yes Oxycodone  - prn pain  Antiplatelet Yes bASA - vascular injury   Hypoglycemics/insulin No   Vasoactive Medication No   Chemotherapy No   Other Yes Acetaminophen , duloxetine , gabapentin  - pain Vitamin C , zinc , MVI - supplement Bisacodyl , Miralax , Senokot-S - constipation  Methocarbamol , tizanidine  - muscle spasm/pain, hypertonicity Atomoxetine , midodrine  - orthostatic hypotension  PRN: Maalox - indigestion Robitussin DM - cough Miralax , Fleet, sorbitol  - constipation Trazodone  - sleep     Type of Medication Issue Identified Description of Issue Recommendation(s)  Drug Interaction(s) (clinically significant)     Duplicate Therapy  On methocarbamol  and tizanidine  scheduled which are both muscle relaxants As MSK pain improves, consider weaning off methocarbamol  and continuing tizanidine  for hypertonicity   Allergy     No Medication Administration End Date     Incorrect Dose     Additional Drug Therapy Needed     Significant med changes from prior encounter (inform family/care partners about these prior to discharge). No medications PTA   Other       Clinically significant medication issues were identified that warrant physician communication and completion of prescribed/recommended actions by midnight of the next day:  No  Name of provider notified for urgent issues identified:   Provider Method of Notification:     Pharmacist comments:   Time spent performing this drug regimen review (minutes):  20   Delon Sax, PharmD, BCPS Clinical Pharmacist Clinical phone for 04/13/2024 is [phone] 04/13/2024 8:24  AM
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
## 2024-04-14 ENCOUNTER — Inpatient Hospital Stay (HOSPITAL_COMMUNITY): Payer: Self-pay

## 2024-04-14 DIAGNOSIS — G8252 Quadriplegia, C1-C4 incomplete: Secondary | ICD-10-CM

## 2024-04-14 NOTE — Progress Notes (Signed)
 PROGRESS NOTE   Subjective/Complaints:  Pt doing well again, slept well, pain well managed. LBM last night and he thinks also early this morning. Urinating great, hasn't needed cathing here. No other complaints or concerns.   ROS: as per HPI. Denies CP, SOB, abd pain, N/V/D/C, or any other complaints at this time.     Objective:   No results found. Recent Labs    04/13/24 0540  WBC 7.4  HGB 9.6*  HCT 28.5*  PLT 542*   Recent Labs    04/12/24 1859 04/13/24 0540  NA  --  138  K  --  4.2  CL  --  102  CO2  --  26  GLUCOSE  --  99  BUN  --  16  CREATININE 0.66 0.84  CALCIUM   --  9.0       No intake or output data in the 24 hours ending 04/14/24 1114       Physical Exam: Vital Signs Blood pressure 107/70, pulse 71, temperature 98.5 F (36.9 C), resp. rate 18, height 6' 4 (1.93 m), weight 88.9 kg, SpO2 99%.  Constitutional: No distress . Vital signs reviewed. Resting in bed.  HEENT: NCAT, EOMI, oral membranes moist, scar mid forehead, staples out Neck: supple, posterior cervical incision c/d/I and covered-- not reassessed today Cardiovascular: RRR without m/r/g appreciated. No JVD    Respiratory/Chest: CTA Bilaterally without wheezes or rales. Normal effort    GI/Abdomen: soft, +BS throughout, non-tender, non-distended GU: no condom cath in place Ext: no clubbing, cyanosis, or edema, LUE wrist splint Psych: pleasant and cooperative Skin: dry, warm, bruises   PRIOR EXAMS: Eyes:     Comments: Right scleral hemorrhage  Genitourinary:    Comments: Incontinent of urine, condom loose Neurological:     Mental Status: He is alert.     Comments: Alert and oriented x 3. Normal insight and awareness. Intact Memory. Normal language and speech. Cranial nerve exam unremarkable. MMT: RUE: delt 2+/5, biceps 3- to 3/5, triceps, wrist and HI all 0/5.  LUE: delt 1/5, biceps 2/5, triceps, wrist, HI all 0/5. RLE: 1/5 HE  and HAD, 0/5 everywhere else. LLE: 2/5 HF, HAD and KE, ADF/PF 2+/5. Sensory exam diminished below C5 on right and ~C6 on left. 1/2 sensation in trunk and both LE, as well as inguinal/rectal area. Sensed sl more in LLE. DTR's 1+ in BUE and 3+ in BLE. Pt with significant extensor and hip adductor tone in both legs. 4-5 beats of clonus RLE and 2-3 in LLE.     Assessment/Plan: 1. Functional deficits which require 3+ hours per day of interdisciplinary therapy in a comprehensive inpatient rehab setting. Physiatrist is providing close team supervision and 24 hour management of active medical problems listed below. Physiatrist and rehab team continue to assess barriers to discharge/monitor patient progress toward functional and medical goals  Care Tool:  Bathing        Body parts bathed by helper: Buttocks, Right upper leg, Left upper leg, Right lower leg, Left lower leg, Face, Right arm, Chest, Left arm, Abdomen, Front perineal area     Bathing assist Assist Level: Dependent - Patient 0%  Upper Body Dressing/Undressing Upper body dressing   What is the patient wearing?: Pull over shirt    Upper body assist Assist Level: Dependent - Patient 0%    Lower Body Dressing/Undressing Lower body dressing      What is the patient wearing?: Underwear/pull up, Incontinence brief     Lower body assist Assist for lower body dressing: Dependent - Patient 0%     Toileting Toileting    Toileting assist Assist for toileting: Dependent - Patient 0%     Transfers Chair/bed transfer  Transfers assist     Chair/bed transfer assist level: Dependent - mechanical lift     Locomotion Ambulation   Ambulation assist   Ambulation activity did not occur: Safety/medical concerns          Walk 10 feet activity   Assist  Walk 10 feet activity did not occur: Safety/medical concerns        Walk 50 feet activity   Assist Walk 50 feet with 2 turns activity did not occur:  Safety/medical concerns         Walk 150 feet activity   Assist Walk 150 feet activity did not occur: Safety/medical concerns         Walk 10 feet on uneven surface  activity   Assist Walk 10 feet on uneven surfaces activity did not occur: Safety/medical concerns         Wheelchair     Assist Is the patient using a wheelchair?: Yes Type of Wheelchair: Manual    Wheelchair assist level: Dependent - Patient 0% Max wheelchair distance: 150    Wheelchair 50 feet with 2 turns activity    Assist        Assist Level: Dependent - Patient 0%   Wheelchair 150 feet activity     Assist      Assist Level: Dependent - Patient 0%   Blood pressure 107/70, pulse 71, temperature 98.5 F (36.9 C), resp. rate 18, height 6' 4 (1.93 m), weight 88.9 kg, SpO2 99%.  Medical Problem List and Plan: 1. 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 Darnella of NSG on 03/28/24:             -patient may not yet shower             -ELOS/Goals: 28-32 days, mod to max assist goals             -No collar needed per Dr. Darnella             -Bilateral PRAFO's, R WHO.  -I discussed with patient the basic approach  we will be taking with his rehab. He has had some improvement in his ASIA score since he was seen by Dr. Cornelio on 8/18. However, he will still likely require a power wheelchair as his primary means of mobility at discharge and will need significant assistance from family. He seems motivated and willing to work, however.  2.  Antithrombotics: -DVT/anticoagulation:  Pharmaceutical: Lovenox  30mg  BID  -04/14/24 DVT U/S pending             -antiplatelet therapy: ASA 81mg  daily 3. Pain Management: Cymbalta  30mg   nightly, Gabapentin  600mg  TID, Tylenol  1000mg  q6h, Robaxin  1000mg  q8h, oxycodone  10-15mg  q4h prn,  -Consider increasing Cymbalta  to  60mg  nightly for improved pain control if needed -Pt developing early hypertonicity in LE's. Will begin scheduling tizanidine  at 2mg  tid to start. Can have 2mg  tid prn as well -04/13/24 decrease tylenol  650mg  QID d/t transminitis as below 4. Mood/Behavior/Sleep: ego support             -antipsychotic agents: n/a             -Psych consult inpatient, no needs at that time -Pt will need emotional support. Seems to be pretty upbeat. Family sounds supportive. Will have Dr. Corina see pt at some point during admit  5. Neuropsych/cognition: This patient is capable of making decisions on his own behalf. 6. Skin/Wound Care/large forehead laceration: staples removed 04/04/24. Routine skin care.  7. Fluids/Electrolytes/Nutrition: routine I&O, routine labs, continue vitamins/supplements (Ensure plus high protein BID, MVI)             -Regular diet 8. 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 Lester of NIR, continue ASA 81mg  daily 9. Superior endplate compression fx T5, T7, T8, T9, B/L upper thoracic posterior paraspinal muscle injury and edema, interspinous ligament injury T2-3 and possibly T3-4: no intervention needed. Per Dr. Darnella.  10. B/L apical pulmonary contusions, small pulmonary lac RLL: aggressive pulmonary toilet 11. L Ulnar styloid fx: NWB LUE, splint, f/up Dr. Murrell 12. Orthostatic hypotension: continue Midodrine  20mg  TID, Strattera  25mg  daily also started per Dr. Jyl recs with goal perhaps of coming off midodrine . -continue TEDs and abdominal binder -04/14/24 BPs improving, monitor Vitals:   04/12/24 1804 04/12/24 1950 04/13/24 0518 04/13/24 1621  BP: 101/63 (!) 93/59 93/63 111/70   04/13/24 2010 04/14/24 0528  BP: 102/69 107/70    13. Neurogenic bowel: on  bowel program: Dulcolax suppository 10mg  daily, Miralax  BID, SenokotS 1 tab BID              -will adjust bowel program to 1800 daily             -senokot-s 2 tabs q am and miralax  BID -04/14/24 LBM yesterday and maybe overnight but not documented 14. Neurogenic bladder: continue ISC q6h             -adjust I/O cath schedule to volumes of 300-500 cc -04/13/24 apparently used condom cath last night-- does feel urge to urinate, no bladder scans documented but pt says they were done but no ISC needed -Reviewed with nursing the plan for him, no condom cath for now, attempt with urinal q4-6h and bladder scan -ISC if scan >250cc -04/14/24 so far no ISC needed! Low PVRs!  15. Polysubstance abuse: UDS +cocaine, THC, Benzos-- provide counseling 16. 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 F/up outpatient.  17. ABLA: hgb stable 8-9 range, monitor twice weekly  -04/13/24 Hgb 9.6, monitor 18. Transaminitis: -04/13/24 Alk phos 286, AST 58, ALT 153-- could be from shock liver, monitor again on Monday. Decrease tylenol  to 650mg  QID. No abd pain/n/v but consider imaging/work up if those develop.       LOS: 2 days A FACE TO FACE EVALUATION WAS PERFORMED  [address] 04/14/2024, 11:14 AM

## 2024-04-14 NOTE — Progress Notes (Signed)
 IP Rehab Bowel Program Documentation   Bowel Program Start time [phone]  Dig Stim Indicated? Yes  Dig Stim Prior to Suppository or mini Enema X 3   Output from dig stim: Minimal  Ordered intervention: Suppository Yes , mini enema No ,   Repeat dig stim after Suppository or Mini enema  X 1,  Output? Large   Bowel Program Complete? Yes , handoff given YES   Patient Tolerated? Yes

## 2024-04-14 NOTE — Progress Notes (Signed)
 VASCULAR LAB    Bilateral lower extremity venous duplex has been performed.  See CV proc for preliminary results.   Margeaux Swantek, RVT 04/14/2024, 11:19 AM

## 2024-04-15 LAB — COMPREHENSIVE METABOLIC PANEL WITH GFR
ALT: 119 U/L — ABNORMAL HIGH (ref 0–44)
AST: 42 U/L — ABNORMAL HIGH (ref 15–41)
Albumin: 2.9 g/dL — ABNORMAL LOW (ref 3.5–5.0)
Alkaline Phosphatase: 262 U/L — ABNORMAL HIGH (ref 38–126)
Anion gap: 10 (ref 5–15)
BUN: 20 mg/dL (ref 6–20)
CO2: 27 mmol/L (ref 22–32)
Calcium: 9 mg/dL (ref 8.9–10.3)
Chloride: 100 mmol/L (ref 98–111)
Creatinine, Ser: 0.82 mg/dL (ref 0.61–1.24)
GFR, Estimated: >60 mL/min (ref >60)
Glucose, Bld: 98 mg/dL (ref 70–99)
Potassium: 4.2 mmol/L (ref 3.5–5.1)
Sodium: 137 mmol/L (ref 135–145)
Total Bilirubin: 0.5 mg/dL (ref 0.0–1.2)
Total Protein: 6.9 g/dL (ref 6.5–8.1)

## 2024-04-15 LAB — CBC
HCT: 26.1 % — ABNORMAL LOW (ref 39.0–52.0)
Hemoglobin: 8.8 g/dL — ABNORMAL LOW (ref 13.0–17.0)
MCH: 31.4 pg (ref 26.0–34.0)
MCHC: 33.7 g/dL (ref 30.0–36.0)
MCV: 93.2 fL (ref 80.0–100.0)
Platelets: 546 K/uL — ABNORMAL HIGH (ref 150–400)
RBC: 2.8 MIL/uL — ABNORMAL LOW (ref 4.22–5.81)
RDW: 12.5 % (ref 11.5–15.5)
WBC: 9.1 K/uL (ref 4.0–10.5)
nRBC: 0 % (ref 0.0–0.2)

## 2024-04-15 MED ORDER — DULOXETINE HCL 30 MG PO CPEP
30.0000 mg | ORAL_CAPSULE | Freq: Every day | ORAL | Status: DC
  Administered 2024-04-16 – 2024-05-02 (×17): 30 mg via ORAL
  Filled 2024-04-15 (×17): qty 1

## 2024-04-15 MED ORDER — ENSURE PLUS HIGH PROTEIN PO LIQD
237.0000 mL | Freq: Three times a day (TID) | ORAL | Status: DC
  Administered 2024-04-15 – 2024-05-13 (×83): 237 mL via ORAL
  Filled 2024-04-15 (×3): qty 237

## 2024-04-15 NOTE — Progress Notes (Signed)
 Physical Therapy Session Note  Patient Details  Name: Tyrone Mckinney MRN: [id number] Date of Birth: [birth year]  Today's Date: 04/15/2024 PT Individual Time: 1300-1400 PT Individual Time Calculation (min): 60 min   Short Term Goals: Week 1:  PT Short Term Goal 1 (Week 1): Pt will roll to Left w/ total A consistently. PT Short Term Goal 2 (Week 1): Pt will tolerate OOB in w/c x 2 hours. PT Short Term Goal 3 (Week 1): Pt will tolerate sitting EOB x 5' w/ total A.  Skilled Therapeutic Interventions/Progress Updates:  Pt up in TIS with family present upon arrival, agreeable to PT session. Reviewed pressure relief needs with pt and family prior to leaving room. Transfers: slideboard w/c<>mat table total assist X2 (level). Toward left X1, Rt x1. Sitting edge of mat with 2nd person assist at trunk worked on neuo activation of trunk musculature from semireclined to forward lean - requiring physical assist to control end of motion forward/back. Progressing to add eccentric aspects into flexion/ext. Various Rt UE placement attempted to maximize functional assistance. Educated on and practiced incorporation of head into trunk flex/ext in painfree range. Lateral flexion onto Rt elbow performed X3. Ended in TIS working on eccentric control of trunk musculature with assisted flexion into tilted TIS. Cues for relaxation and head placement during rest breaks. Following session, pt up in w/c, TIS tipped, sip/puff call light placed, left in care of nursing.   Therapy Documentation Precautions:  Precautions Precautions: Cervical, Fall Precaution/Restrictions Comments: MAP >65 Restrictions Weight Bearing Restrictions Per Provider Order: Yes LUE Weight Bearing Per Provider Order: Non weight bearing Other Position/Activity Restrictions: Forearm based thumb spica LUE (per ortho); WHO RUE, B PRAFOs General:   Pain: 0/10 - denies pain throughout session  Therapy/Group: Individual Therapy  Tyrone Mckinney 04/15/2024, 4:54 PM

## 2024-04-15 NOTE — Plan of Care (Signed)
  Problem: SCI BLADDER ELIMINATION Goal: RH STG MANAGE BLADDER WITH ASSISTANCE Description: STG Manage Bladder With max Assistance Outcome: Progressing   Problem: RH SKIN INTEGRITY Goal: RH STG SKIN FREE OF INFECTION/BREAKDOWN Description: Manage skin free of infection with max assistance Outcome: Progressing

## 2024-04-15 NOTE — Progress Notes (Signed)
 Physical Therapy Session Note  Patient Details  Name: Tyrone Mckinney MRN: [id number] Date of Birth: [birth year]  Today's Date: 04/15/2024 PT Individual Time: 8984-8884 PT Individual Time Calculation (min): 60 min   Short Term Goals: Week 1:  PT Short Term Goal 1 (Week 1): Pt will roll to Left w/ total A consistently. PT Short Term Goal 2 (Week 1): Pt will tolerate OOB in w/c x 2 hours. PT Short Term Goal 3 (Week 1): Pt will tolerate sitting EOB x 5' w/ total A.  Skilled Therapeutic Interventions/Progress Updates:    Pt seated in TIS w/c on arrival and agreeable to therapy. No complaint of pain. Pt transported to therapy gym for time management and energy conservation.   Session focused on transfer training and sitting balance. Pt participated in tot a +2 slideboard transfer to/from mat table. Tot a for lateral scoot and anterior lean, but pt with good ability to achieve position and noted strength in LLE, so likely will be able to use to assist with transfer. Attempted using RUE to assist, but unable to achieve enough triceps activation during functional movement at this time. Pt participated in various core stability and trunk control activities with back support. Pt able to shift laterally and demoes ability to compensate for weaker trunk with UE and shoulders, with noted trunk extensor>flexor strength, trace abdominal activation at times. Pt able to lean anteriorly onto elbows on knees and maintain with min a. Performed push up plus pushing into elbows for scap protraction/retraction, noted some triceps activation while pushing chest up. Noted difficulty maintaining head up in all positions, with increased fatigue noted by end of session.   Pt returned to room and remained in TIS. Provided instruction on adjusting TIS to pt's daughter, Tyrone Mckinney. Pt and daughter able to recall/teach back timing to maintain pressure relief. Pt remained in TIS with his daughter present.    Therapy  Documentation Precautions:  Precautions Precautions: Cervical, Fall Precaution/Restrictions Comments: MAP >65 Restrictions Weight Bearing Restrictions Per Provider Order: Yes LUE Weight Bearing Per Provider Order: Non weight bearing Other Position/Activity Restrictions: Forearm based thumb spica LUE (per ortho); WHO RUE, B PRAFOs General:       Therapy/Group: Individual Therapy  Tyrone Mckinney 04/15/2024, 12:47 PM

## 2024-04-15 NOTE — Progress Notes (Signed)
 Occupational Therapy Session Note  Patient Details  Name: Manson Serque Drier MRN: [id number] Date of Birth: 1981-05-10  Today's Date: 04/15/2024 OT Individual Time: 9184-9069 OT Individual Time Calculation (min): 75 min    Short Term Goals: Week 1:  OT Short Term Goal 1 (Week 1): Pt will roll laterally in bed with Max A x1 to assist with ADL routine. OT Short Term Goal 2 (Week 1): Pt will bathe face with Mod A using compensatory techniques/strategies as needed. OT Short Term Goal 3 (Week 1): Pt will self-feed with Mod A using compensatory techniques/strategies as needed.  Skilled Therapeutic Interventions/Progress Updates:    Pt resting in bed upon arrival. Skilled OT intervention with focus on bed mobility and UB dressing seated in w/c. Pt incontinent of bladder. Dependent for toileting tasks. Tot A for rolling R/L in bed. Ted hose donned prior to LB dressing at bed level. Dependent for donning pants and shoes at bed level. Dependent transfer to TIS w/c with MaxiMove. UB dressing with tot A. Pt able to initiate pushing RUE into sleeve. Dependent for pulling over head and trunk. Pt with no report of lightheadedness. Pt remained in TIS w/c. Sip-n-puff positioned and pt able to activate. Daughter present.   Therapy Documentation Precautions:  Precautions Precautions: Cervical, Fall Precaution/Restrictions Comments: MAP >65 Restrictions Weight Bearing Restrictions Per Provider Order: Yes LUE Weight Bearing Per Provider Order: Non weight bearing Other Position/Activity Restrictions: Forearm based thumb spica LUE (per ortho); WHO RUE, B PRAFOs   Pain: Pt denies pain this morning   Therapy/Group: Individual Therapy  [name] 04/15/2024, 12:11 PM

## 2024-04-15 NOTE — IPOC Note (Signed)
 Overall Plan of Care [facility]) Patient Details Name: Tyrone Mckinney MRN: [id number] DOB: Jun 25, 1981  Admitting Diagnosis: Spinal cord injury, cervical region, sequela [facility])  Hospital Problems: Principal Problem:   Spinal cord injury, cervical region, sequela [facility])     Functional Problem List: Nursing Bladder, Bowel, Edema, Behavior, Endurance, Medication Management, Motor, Pain, Perception, Safety, Skin Integrity  PT Balance, Safety, Endurance, Motor  OT Balance, Endurance, Motor, Perception, Safety, Skin Integrity, Sensory  SLP    TR         Basic ADL's: OT Eating, Grooming, Bathing, Dressing, Toileting     Advanced  ADL's: OT       Transfers: PT Bed Mobility, Bed to Chair  OT Toilet, Tub/Shower     Locomotion: PT Wheelchair Mobility     Additional Impairments: OT Fuctional Use of Upper Extremity  SLP        TR      Anticipated Outcomes Item Anticipated Outcome  Self Feeding Min A  Swallowing      Basic self-care  Mod A  Toileting  Mod A   Bathroom Transfers Mod A  Bowel/Bladder  manage bowels with bowel program/ manage bladder with bladder program  Transfers  Max A vs mechanical lift training.  Locomotion  supervision w/ appropriate PWC.  Communication     Cognition     Pain  <4 w/ prn  Safety/Judgment  manage safety with mod-max assistance   Therapy Plan: PT Intensity: Minimum of 1-2 x/day ,45 to 90 minutes PT Frequency: 5 out of 7 days PT Duration Estimated Length of Stay: 4 weeks OT Intensity: Minimum of 1-2 x/day, 45 to 90 minutes OT Frequency: 5 out of 7 days OT Duration/Estimated Length of Stay: ~4 weeks     Team Interventions: Nursing Interventions Patient/Family Education, Medication Management, Bladder Management, Bowel Management, Disease Management/Prevention, Pain Management, Discharge Planning, Skin Care/Wound Management  PT interventions Community reintegration, DME/adaptive equipment instruction, Neuromuscular  re-education, Psychosocial support, UE/LE Strength taining/ROM, Wheelchair propulsion/positioning, Discharge planning, Warden/ranger, Therapeutic Activities, Skin care/wound management, UE/LE Coordination activities, Patient/family education, Splinting/orthotics, Therapeutic Exercise  OT Interventions Warden/ranger, Community reintegration, Discharge planning, Disease mangement/prevention, DME/adaptive equipment instruction, Functional electrical stimulation, Functional mobility training, Neuromuscular re-education, Pain management, Patient/family education, Psychosocial support, Self Care/advanced ADL retraining, Skin care/wound managment, Splinting/orthotics, Therapeutic Activities, Therapeutic Exercise, UE/LE Strength taining/ROM, UE/LE Coordination activities, Visual/perceptual remediation/compensation, Wheelchair propulsion/positioning  SLP Interventions    TR Interventions    SW/CM Interventions Discharge Planning, Psychosocial Support, Patient/Family Education   Barriers to Discharge MD  Medical stability, Home enviroment access/loayout, Incontinence, Neurogenic bowel and bladder, Wound care, Lack of/limited family support, Insurance for SNF coverage, and Weight bearing restrictions  Nursing Decreased caregiver support, Home environment access/layout, Neurogenic Bowel & Bladder, Wound Care, Weight bearing restrictions Discharge: House  Discharge Home Layout: One level  Discharge Home Access: Stairs to enter  Entrance Stairs-Rails: Right, Left, Can reach both  Entrance Stairs-Number of Steps: 5-6;  PT Inaccessible home environment, Lack of/limited family support, Weight bearing restrictions, Other (comments) (new SCI) home entrance, CG assistance needed.  OT Inaccessible home environment, Home environment access/layout, Neurogenic Bowel & Bladder, Weight bearing restrictions    SLP      SW       Team Discharge Planning: Destination: PT-Home ,OT- Home , SLP-   Projected Follow-up: PT-Home health PT, OT-  Home health OT, SLP-  Projected Equipment Needs: PT-To be determined, Wheelchair cushion (measurements), Wheelchair (measurements), OT- To be determined, SLP-  Equipment Details: PT- , OT-  Patient/family involved in discharge planning: PT- Patient,  OT-Patient, SLP-   MD ELOS: ~4 weeks Medical Rehab Prognosis:  Fair Assessment: The patient has been admitted for CIR therapies with the diagnosis of incomplete quadriplegia. The team will be addressing functional mobility, strength, stamina, balance, safety, adaptive techniques and equipment, self-care, bowel and bladder mgt, patient and caregiver education, spasticity and AD management. Goals have been set at mod- max A for transfers. Anticipated discharge destination is home with family.        See Team Conference Notes for weekly updates to the plan of care

## 2024-04-15 NOTE — Progress Notes (Addendum)
 Inpatient Rehabilitation Care Coordinator Assessment and Plan Patient Details  Name: Tyrone Mckinney MRN: [id number] Date of Birth: 1981/04/15  Today's Date: 04/15/2024  Hospital Problems: Principal Problem:   Spinal cord injury, cervical region, sequela [facility])  Past Medical History: History reviewed. No pertinent past medical history. Past Surgical History:  Past Surgical History:  Procedure Laterality Date   POSTERIOR CERVICAL FUSION/FORAMINOTOMY N/A 03/28/2024   Procedure: CERVICAL TWO TO CERVICAL SEVEN POSTERIOR CERVICAL FUSION, CERVICAL THREE TO CERVICAL SEVEN LAMINECTOMY;  Surgeon: Darnella Dorn SAUNDERS, MD;  Location: [facility] OR;  Service: Neurosurgery;  Laterality: N/A;  Posterior Cervical level two  to thoracic one Decompression and fusion   SCALP LACERATION REPAIR  03/28/2024   Procedure: REPAIR, LACERATION, SCALP;  Surgeon: Paola Dreama SAILOR, MD;  Location: [facility] OR;  Service: General;;   Social History:  reports that he has never smoked. He does not have any smokeless tobacco history on file. He reports that he does not drink alcohol. No history on file for drug use.  Family / Support Systems Marital Status: Separated How Long?: verbally seperated Patient Roles: Spouse Spouse/Significant Other: Erminio (wife) Children: 3 children- [name] (22), Malachai (19), and Mariah (19) Other Supports: Darren (brother) lives in [place]  and will be here on Friday. Anticipated Caregiver: All childre and brother Ability/Limitations of Caregiver: All family works but willing to provide support to him in which he will have someone with him at all times. 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 Legal History/Current Legal Issues: Pt reports jail hx 2-3 years ago. Denies any current hx Guardian/Conservator: No HCPOA   Abuse/Neglect Abuse/Neglect Assessment Can Be Completed: Yes Physical Abuse: Denies Verbal Abuse: Denies Sexual Abuse: Denies Exploitation of patient/patient's resources: Denies Self-Neglect: Denies  Patient response to: Social Isolation - How often do you feel lonely or isolated from those around you?: Never  Emotional Status Pt's affect, behavior and adjustment status: Pt in good spirits at time of visit Recent Psychosocial Issues: Denies Psychiatric History: Denies Substance Abuse History: Pt denies tobacco product use. He denies any etoh use. ADmits to marijuana use and used one month ago; Per EMR +THC, cocaine and Benzos.  Patient / Family Perceptions, Expectations & Goals Pt/Family understanding of illness & functional limitations: Pt and family have a general understanding of care needs Premorbid pt/family roles/activities: Independent Anticipated changes in roles/activities/participation: Dependent Pt/family expectations/goals: Pt goal is to get his body back to where it was, and his joints functioning better  [employer]: None Premorbid Home Care/DME Agencies: None Transportation available at discharge: TBD Is the patient able to respond to transportation needs?: Yes In the past 12 months, has lack of transportation kept you from medical appointments or from getting medications?: No In the past 12 months, has lack of transportation kept you from meetings, work, or from getting things needed for daily living?: No Resource referrals recommended: Neuropsychology  Discharge Planning Living Arrangements: Spouse/significant other,  Children Support Systems: Spouse/significant other, Children, Other relatives Type of Residence: Private residence Insurance Resources: Customer service manager Resources: Family Support Financial Screen Referred: No  Living Expenses: Mortgage Money Management: Spouse Does the patient have any problems obtaining your medications?: No Home Management: Wife manages all homecare needs; reports they eat out alot as well. Patient/Family Preliminary Plans: TBD Care Coordinator Barriers to Discharge: Decreased caregiver support, Lack of/limited family support, Insurance for SNF coverage, Other (comments) Care Coordinator Barriers to Discharge Comments: Uninsured Care Coordinator Anticipated Follow Up Needs: HH/OP Expected length of stay: 4 weeks  Clinical Impression SW met with pt and pt dtr Mikayala to introduce self, explain role, discuss discharge process, and inform on ELOS. Pt designates his brother [name] [phone] as main contact. Pt is not a Cytogeneticist. Aware SW will follow-up with his brother.   1054- SW left message for his brother [name] to introduce self, explain role, discuss discharge process, inform on ELOS, and requested a return phone call.   *per [facility] account- pt already screened for Medicaid and application pending. [facility] referral submitted as well.   1229- SW received return phone call from pt brother [name] who reported he will be coming next Friday now due to a  change of plans. Reports he will stay for a short period of time, and will make himself available as best as possible to attend family meeting and education due to having a family. SW informed will continue to provide updates as available.   Tron Flythe A Keoni Havey 04/15/2024, 11:18 AM
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
## 2024-04-15 NOTE — Progress Notes (Signed)
 IP Rehab Bowel Program Documentation   Bowel Program Start time [phone]  Dig Stim Indicated? Yes  Dig Stim Prior to Suppository or mini Enema X 3   Output from dig stim: Minimal  Ordered intervention: Suppository Yes , mini enema No ,   Repeat dig stim after Suppository or Mini enema  X yes per MD order,  Output? Minimal   Bowel Program Complete? Yes , handoff given yes  Patient Tolerated? Yes

## 2024-04-15 NOTE — Progress Notes (Signed)
 Patient ID: Tyrone Mckinney, male   DOB: 08/12/1981, 43 y.o.   MRN: [id number]  [phone]- SW left message for pt wife to introduce self, explain role, discus discharge process, and requested return phone call to discuss plan of care at discharge. SW waiting on follow-up or will call after team conference tomorrow.   Graeme Jude, MSW, LCSW Office: [phone] Cell: [phone] Fax: [phone]

## 2024-04-15 NOTE — Care Management (Signed)
 Inpatient Rehabilitation Center Individual Statement of Services  Patient Name:  Tyrone Mckinney  Date:  04/15/2024  Welcome to the Inpatient Rehabilitation Center.  Our goal is to provide you with an individualized program based on your diagnosis and situation, designed to meet your specific needs.  With this comprehensive rehabilitation program, you will be expected to participate in at least 3 hours of rehabilitation therapies Monday-Friday, with modified therapy programming on the weekends.  Your rehabilitation program will include the following services:  Physical Therapy (PT), Occupational Therapy (OT), 24 hour per day rehabilitation nursing, Therapeutic Recreaction (TR), Psychology, Neuropsychology, Care Coordinator, Rehabilitation Medicine, Nutrition Services, Pharmacy Services, and Other  Weekly team conferences will be held on Tuesday to discuss your progress.  Your Inpatient Rehabilitation Care Coordinator will talk with you frequently to get your input and to update you on team discussions.  Team conferences with you and your family in attendance may also be held.  Expected length of stay: 4 weeks    Overall anticipated outcome: Moderate Assistance  Depending on your progress and recovery, your program may change. Your Inpatient Rehabilitation Care Coordinator will coordinate services and will keep you informed of any changes. Your Inpatient Rehabilitation Care Coordinator's name and contact numbers are listed  below.  The following services may also be recommended but are not provided by the Inpatient Rehabilitation Center:  Driving Evaluations Home Health Rehabiltiation Services Outpatient Rehabilitation Services Vocational Rehabilitation   Arrangements will be made to provide these services after discharge if needed.  Arrangements include referral to agencies that provide these services.  Your insurance has been verified to be:  Uninsured  Your primary doctor is:  No PCP  listed  Pertinent information will be shared with your doctor and your insurance company.  Inpatient Rehabilitation Care Coordinator:  Graeme Jude, KEN [phone] or ([phone]  Information discussed with and copy given to patient by: Graeme DELENA Jude, 04/15/2024, 9:44 AM

## 2024-04-16 ENCOUNTER — Ambulatory Visit: Payer: Self-pay | Admitting: Family Medicine

## 2024-04-16 DIAGNOSIS — G8252 Quadriplegia, C1-C4 incomplete: Secondary | ICD-10-CM | POA: Diagnosis present

## 2024-04-16 DIAGNOSIS — F54 Psychological and behavioral factors associated with disorders or diseases classified elsewhere: Secondary | ICD-10-CM

## 2024-04-16 NOTE — Progress Notes (Signed)
 Inpatient Rehabilitation  Patient information reviewed and entered into eRehab system by Burnard Mealing, OTR/L, Rehab Quality Coordinator.   Information including medical coding, functional ability and quality indicators will be reviewed and updated through discharge.

## 2024-04-16 NOTE — Plan of Care (Signed)
  Problem: Consults Goal: RH SPINAL CORD INJURY PATIENT EDUCATION Description:  See Patient Education module for education specifics.  Outcome: Progressing   Problem: SCI BOWEL ELIMINATION Goal: RH STG MANAGE BOWEL WITH ASSISTANCE Description: STG Manage Bowel with max Assistance. Outcome: Progressing   Problem: SCI BLADDER ELIMINATION Goal: RH STG MANAGE BLADDER WITH ASSISTANCE Description: STG Manage Bladder With max Assistance Outcome: Progressing   Problem: RH SKIN INTEGRITY Goal: RH STG SKIN FREE OF INFECTION/BREAKDOWN Description: Manage skin free of infection with max assistance Outcome: Progressing

## 2024-04-16 NOTE — Progress Notes (Signed)
 IP Rehab Bowel Program Documentation  Day shift 9/2 Bowel Program Start time 1800   Dig Stim Indicated? Yes  Dig Stim Prior to Suppository or mini Enema X 3    Output from dig stim: Minimal   Ordered intervention: Suppository Yes , mini enema No ,    Repeat dig stim after Suppository or Mini enema  X 3,   Output? Minimal    Bowel Program Complete? No , handoff given yes   Patient Tolerated? Yes   PM shift 9/2 Bowel Program- Last BM per pt and RN report- about 03:00 04/16/24  19:30 repeat dig stim x1; minimal output;  23:00 repeat dig stim x1; minimal output
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
## 2024-04-16 NOTE — Progress Notes (Signed)
 Occupational Therapy Session Note  Patient Details  Name: Tyrone Mckinney MRN: [id number] Date of Birth: [birth year]  Today's Date: 04/16/2024 OT Individual Time: 9184-9074 OT Individual Time Calculation (min): 70 min    Short Term Goals: Week 1:  OT Short Term Goal 1 (Week 1): Pt will roll laterally in bed with Max A x1 to assist with ADL routine. OT Short Term Goal 2 (Week 1): Pt will bathe face with Mod A using compensatory techniques/strategies as needed. OT Short Term Goal 3 (Week 1): Pt will self-feed with Mod A using compensatory techniques/strategies as needed.  Skilled Therapeutic Interventions/Progress Updates:    Pt resting in bed upon arrival. Dtr present and assisting pt with washing face. Skilled OT intervention with focus on bed mobility, BUE strengthening/AROM/AAROM. Pt tot A+2 for rolling R/L in bed. Pt incontinent of bladder (pt unaware.) OTA donned Ace Wraps before donning pants. Pt dependent for dressing tasks. Blood noted on pillow case. RN present and sources determined to be Rt lateral forehead incision. No fresh blood noted. Pt with increased RUE biceps and trace triceps. LUE shoulder elevation and trace deltoids. Dependent transfer to TIS w/c with MaxiMOve. Dependent for repositioning. Pt remained in w/c with all needs within reach. Dtr present.   Therapy Documentation Precautions:  Precautions Precautions: Cervical, Fall Precaution/Restrictions Comments: MAP >65 Restrictions Weight Bearing Restrictions Per Provider Order: Yes LUE Weight Bearing Per Provider Order: Non weight bearing Other Position/Activity Restrictions: Forearm based thumb spica LUE (per ortho); WHO RUE, B PRAFOs   Pain: Pt reports R/L shoulder pain; repositioned  Therapy/Group: Individual Therapy  [name] 04/16/2024, 9:29 AM

## 2024-04-16 NOTE — Patient Care Conference (Incomplete)
 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 Duwaine Barrs Social Worker Present: Graeme Jude, LCSW Nurse Present: Eulalio Falls, RN PT Present: Schuyler Batter, PT OT Present: Delon Sharps, OT;Charlena Cha, COTA PPS Coordinator present : Eleanor Colon, SLP     Current Status/Progress Goal Weekly Team Focus  Bowel/Bladder   incontinent of bowel and bladder  Neurogenic bowel and bladder monitor skin to avoid breakdown   avoid skin breakdown    Swallow/Nutrition/ Hydration               ADL's   bed mobility-tot A+2, BADLs-tot A=2; RUE-biceps 3/5 and trace finger flexion   mod A overall   BADLs, sitting balance, transfers, education    Mobility   tot +2 for all mobility, introduced transfer board with good success, may be able to use RUE and LLE to assist in transfer.   mod a bed mobility, max a bed/chair transfers, supervision w/c  transfer training, SCI education, PWC if enough hand control    Communication                Safety/Cognition/ Behavioral Observations               Pain   pain will be kept minimal   continue to monitor pain level   keep pain level minimal to none    Skin   no skin breakdown  Incision right side head- small amount of dried sangouinous drainage noted; cleaned w/ NS; monitor assess skin to avoid  breakdown and continue with repostioning  continue to assess skin to avoid breakdown      Discharge Planning:  Uninsured. Pt will d/c to home with his ex-wife, and children. Reports his brother from [place]  is coming to visit-unsure on his length of stay. SW will confirm there are no barriers to discharge.    Team Discussion: *** Patient on target to meet rehab goals: {IP REHAB YES/NO WITH TPOIRJMID:[id number]}  *See Care Plan and progress notes for long and short-term goals.   Revisions to Treatment Plan:  ***  Teaching Needs: ***  Current Barriers to Discharge: {BARRIERS TO IPDRYJMHZ:[id number]}  Possible Resolutions to Barriers: ***     Medical Summary Current Status: C4 ASIA C- bowel program-working well;  blood dried blood on pillow from forhead wound;  Barriers to Discharge: Automomic dysreflexia;Behavior/Mood;Complicated Wound;Hypotension;Medical stability;Neurogenic Bowel & Bladder;Self-care education;Spasticity;Uncontrolled Pain;Weight bearing restrictions;Incontinence  Barriers to Discharge Comments: limited by lack of incision;  likely in denial;  quadriplegia; gettign very slight improvement- total A of 2- Possible Resolutions to [employer] Focus: bladder incontinent- timed voiding; needs family conference- and monitor spasticity- d/c 4 weeks   Continued Need for Acute Rehabilitation Level of Care: The patient requires daily medical management by a physician with specialized training in physical medicine and rehabilitation for the following reasons: Direction of a multidisciplinary physical rehabilitation program to  maximize functional independence : Yes Medical management of patient stability for increased activity during participation in an intensive rehabilitation regime.: Yes Analysis of laboratory values and/or radiology reports with any subsequent need for medication adjustment and/or medical intervention. : Yes   I attest that I was present, lead the team  conference, and concur with the assessment and plan of the team.   Gearldine Looney Gayo 04/16/2024, 1122 am
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
## 2024-04-16 NOTE — Plan of Care (Signed)
  Problem: SCI BLADDER ELIMINATION Goal: RH STG MANAGE BLADDER WITH ASSISTANCE Description: STG Manage Bladder With max Assistance Outcome: Progressing   Problem: RH SKIN INTEGRITY Goal: RH STG SKIN FREE OF INFECTION/BREAKDOWN Description: Manage skin free of infection with max assistance Outcome: Progressing
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
## 2024-04-16 NOTE — Progress Notes (Signed)
 Physical Therapy Session Note  Patient Details  Name: Tyrone Mckinney MRN: [id number] Date of Birth: 1981/04/27  Today's Date: 04/16/2024 PT Individual Time: 1000-1031 PT Individual Time Calculation (min): 31 min   Short Term Goals: Week 1:  PT Short Term Goal 1 (Week 1): Pt will roll to Left w/ total A consistently. PT Short Term Goal 2 (Week 1): Pt will tolerate OOB in w/c x 2 hours. PT Short Term Goal 3 (Week 1): Pt will tolerate sitting EOB x 5' w/ total A.  Skilled Therapeutic Interventions/Progress Updates:    Pt seated in w/c on arrival and agreeable to therapy. No complaint of pain. Session focused on transfer training and seated balance. Pt first performed >10 reps scap retraction + head and neck lift into neutral with chair positioned upright to build cervical extensor and periscapular strength. Noted good rhomboid activation. Pt cued throughout to work on maintaining position and keeping head upright even with fatigue. slideboard transfer <>mat table with tot a x 2. Extensive cueing and visual demo to improve LLE involvement in transfer to increase independence. Pt returned to room early d/t expecting phone call from [employer]. Pt missed x 30 min for social security meeting, will make up as able.   Therapy Documentation Precautions:  Precautions Precautions: Cervical, Fall Precaution/Restrictions Comments: MAP >65 Restrictions Weight Bearing Restrictions Per Provider Order: Yes LUE Weight Bearing Per Provider Order: Non weight bearing Other Position/Activity Restrictions: Forearm based thumb spica LUE (per ortho); WHO RUE, B PRAFOs General: PT Amount of Missed Time (min): 29 Minutes PT Missed Treatment Reason: Other (Comment) ([facility] appointment)     Therapy/Group: Individual Therapy  [name] 04/16/2024, 12:12 PM

## 2024-04-16 NOTE — Progress Notes (Signed)
 Patient ID: Tyrone Mckinney, male   DOB: [birth year], 43 y.o.   MRN: [id number]  SW met with pt and pt dtr [name] in room to provide updates from team conference, ELOS 4 weeks, and family meeting this Thursday at 10am. Pt dtr [name] will be present. Pt aware SW will call his brother to give updates. Pt reports he completed telephone call with [employer] and now waiting to see if approved.   1258- SW left message for pt brother [name] to inform on above. He was asked to return call and confirm if he can be available for conference call for fam meeting on Thursday. SW waiting on follow-up.   Graeme Jude, MSW, LCSW Office: [phone] Cell: [phone] Fax: [phone]

## 2024-04-16 NOTE — Progress Notes (Signed)
 Physical Therapy Session Note  Patient Details  Name: Tyrone Mckinney MRN: [id number] Date of Birth: Jan 03, 1981   Today's Date: 04/16/2024 PT Missed Time: 30 Minutes Missed Time Reason: Other (Comment) ([facility] appointment)    Pt unable to participate secondary to [employer] appointment over the phone. Therapist will reattempt as schedule allows.   Reche Ohara PT, DPT 04/16/2024, 11:06 AM

## 2024-04-16 NOTE — Progress Notes (Signed)
 IP Rehab Bowel Program Documentation   Bowel Program Start time 1800  Dig Stim Indicated? Yes  Dig Stim Prior to Suppository or mini Enema X 3   Output from dig stim: Minimal  Ordered intervention: Suppository Yes , mini enema No ,   Repeat dig stim after Suppository or Mini enema  X 3,  Output? Minimal   Bowel Program Complete? No , handoff given yes  Patient Tolerated? Yes

## 2024-04-17 DIAGNOSIS — G8252 Quadriplegia, C1-C4 incomplete: Secondary | ICD-10-CM

## 2024-04-17 NOTE — Progress Notes (Signed)
 Physical Therapy Session Note  Patient Details  Name: Tyrone Mckinney MRN: [id number] Date of Birth: February 19, 1981  Today's Date: 04/17/2024 PT Individual Time: 0803-0900 and 8696-8579  PT Individual Time Calculation (min): 57 min and 77 min  Short Term Goals: Week 1:  PT Short Term Goal 1 (Week 1): Pt will roll to Left w/ total A consistently. PT Short Term Goal 2 (Week 1): Pt will tolerate OOB in w/c x 2 hours. PT Short Term Goal 3 (Week 1): Pt will tolerate sitting EOB x 5' w/ total A.  Skilled Therapeutic Interventions/Progress Updates: Tx1: Pt presented in bed with dgt present agreeable to therapy. Pt denies pain at rest, c/o mild R shoulder pain with rolling. Pain meds requested at end of session. Pt noted to have TED hose on, provided education regarding schedule for wearing TED hose (PTA placed sign for increased awareness). Pt noted to be incontinent of bladder, performed rolling L/R total A to allow PTA to complete peri-care and don new brief. PTA then threaded pants and completed rolling in same manner to pull pants over hips. PTA and tech donned shoes total A. Pt then completed rolling R total A x 1 and completed supine to sit maxA x 2 to EOB. Pt completed total A transfer to TIS with +2 present for safety. Pt transported to day room and participated in RUE ROM including bicep curls x 10, ER/IR on tray table x10 (PTA stabilizing wrist), and pushing forward/backward on tray x 10. Pt also participated in ER/IR with LUE, and AA bicep curls with LUE x 10. Pt also performed scapular retractions x 10. Pt transported back to room at end of session and remained in TIS with sip and puff set up and current needs met.   Tx2: Pt presented in TIS sleeping but easily aroused. Pt agreeable to therapy. PTA moved TIS to transport and noted significant pool of urine under TIS. Pt therefore set up and completed total A Slide board transfer to bed. Pt required total A for sit supine. Performed rolling maxA to  L as with assistance pt was able to hook RUE over bed rail for support. PTA completed peri-care total A and donned new brief. Pt completed rolling to R total A to complete clothing management. Pt performed rolling in same manner to complete LB clothing management. Pt required total A supine to sit and transferred back to TIS total A via Slide board. Pt transported to day room and completed Slide board transfer total A with this therapist encouraging pt to attempt to push through with LLE for assist. Pt able to perform trace activation however unable at this time to provide adequate assistance. At mat pt worked on static sitting balance requiring total A but pt able to have small bouts of maxA statically. Pt also worked on dorsal extension for improved posture and to open up chest. Pt able to tolerate sitting EOB ~10 min. Pt transferred back to TIS via Slide board and maxA x 2. Pt then taken to [facility] entrance for environmental change to enhance mood. Pt transported back to room at end of session and agreeable to remain in TIS. Pt left with sip and puff in place and current needs met.  PTA returned ~1.5hrs later to transfer pt back to bed total A via Slide board. In bed TED hose removed, heels floated, and SCDs donned, with sip and puff in place.        Therapy Documentation Precautions:  Precautions Precautions: Cervical, Fall Precaution/Restrictions  Comments: MAP >65 Restrictions Weight Bearing Restrictions Per Provider Order: Yes LUE Weight Bearing Per Provider Order: Non weight bearing Other Position/Activity Restrictions: Forearm based thumb spica LUE (per ortho); WHO RUE, B PRAFOs General:   Vital Signs:   Pain: Pain Assessment Pain Scale: 0-10 Pain Score: 8  Pain Location: Shoulder   Therapy/Group: Individual Therapy  Charmika Macdonnell 04/17/2024, 12:37 PM

## 2024-04-17 NOTE — Progress Notes (Signed)
 PROGRESS NOTE   Subjective/Complaints:  Pt reports doing well- think he's good.  Continues to void- but cannot void on his own when they do timed voiding- no voluntary control of voiding, but continues to be incontinent of urine and Bladder scans low-   Bowel program done but only minimal results-  Spasms yesterday but not overnight.  Says had [employer] last night, which will probably make him have BM sometime today?   D/W neuropsych who also agrees he's in denial- and doesn't recognize his SCI issues.    ROS:  Per HPI   Pt denies SOB, abd pain, CP, N/V/C/D, and vision changes     Objective:   No results found.  Recent Labs    04/15/24 0525  WBC 9.1  HGB 8.8*  HCT 26.1*  PLT 546*   Recent Labs    04/15/24 0525  NA 137  K 4.2  CL 100  CO2 27  GLUCOSE 98  BUN 20  CREATININE 0.82  CALCIUM  9.0        Intake/Output Summary (Last 24 hours) at 04/17/2024 9072 Last data filed at 04/17/2024 0000 Gross per 24 hour  Intake 240 ml  Output --  Net 240 ml         Physical Exam: Vital Signs Blood pressure 118/77, pulse 63, temperature 98.3 F (36.8 C), temperature source Oral, resp. rate 19, height 6' 4 (1.93 m), weight 88.9 kg, SpO2 100%.       General: awake, alert, appropriate, sitting up in bed somewhat- daughter at bedside; watching TV; NAD HENT: conjugate gaze; oropharynx moist CV: regular rate and rhythm; no JVD Pulmonary: CTA B/L; no W/R/R- good air movement GI: soft, NT, ND, (+)BS Psychiatric: appropriate- more bright than expected Neurological: Ox3   Ext: no clubbing, cyanosis, or edema, LUE wrist splint Psych: pleasant and cooperative Skin: dry, warm, bruises   PRIOR EXAMS: Eyes:     Comments: Right scleral hemorrhage  Genitourinary:    Comments: Incontinent of urine, condom loose Neurological:     Mental Status: He is alert.     Comments: Alert and oriented x 3. Normal  insight and awareness. Intact Memory. Normal language and speech. Cranial nerve exam unremarkable. MMT: RUE: delt 2+/5, biceps 3- to 3/5, triceps, wrist and HI all 0/5.  LUE: delt 1/5, biceps 2/5, triceps, wrist, HI all 0/5. RLE: 1/5 HE and HAD, 0/5 everywhere else. LLE: 2/5 HF, HAD and KE, ADF/PF 2+/5. Sensory exam diminished below C5 on right and ~C6 on left. 1/2 sensation in trunk and both LE, as well as inguinal/rectal area. Sensed sl more in LLE. DTR's 1+ in BUE and 3+ in BLE. Pt with significant extensor and hip adductor tone in both legs. 4-5 beats of clonus RLE and 2-3 in LLE.     Assessment/Plan: 1. Functional deficits which require 3+ hours per day of interdisciplinary therapy in a comprehensive inpatient rehab setting. Physiatrist is providing close team supervision and 24 hour management of active medical problems listed below. Physiatrist and rehab team continue to assess barriers to discharge/monitor patient progress toward functional and medical goals  Care Tool:  Bathing        Body parts  bathed by helper: Buttocks, Right upper leg, Left upper leg, Right lower leg, Left lower leg, Face, Right arm, Chest, Left arm, Abdomen, Front perineal area     Bathing assist Assist Level: Dependent - Patient 0%     Upper Body Dressing/Undressing Upper body dressing   What is the patient wearing?: Pull over shirt    Upper body assist Assist Level: Dependent - Patient 0%    Lower Body Dressing/Undressing Lower body dressing      What is the patient wearing?: Incontinence brief     Lower body assist Assist for lower body dressing: Dependent - Patient 0%     Toileting Toileting    Toileting assist Assist for toileting: Dependent - Patient 0%     Transfers Chair/bed transfer  Transfers assist     Chair/bed transfer assist level: Dependent - mechanical lift     Locomotion Ambulation   Ambulation assist   Ambulation activity did not occur: Safety/medical  concerns          Walk 10 feet activity   Assist  Walk 10 feet activity did not occur: Safety/medical concerns        Walk 50 feet activity   Assist Walk 50 feet with 2 turns activity did not occur: Safety/medical concerns         Walk 150 feet activity   Assist Walk 150 feet activity did not occur: Safety/medical concerns         Walk 10 feet on uneven surface  activity   Assist Walk 10 feet on uneven surfaces activity did not occur: Safety/medical concerns         Wheelchair     Assist Is the patient using a wheelchair?: Yes Type of Wheelchair: Manual    Wheelchair assist level: Dependent - Patient 0% Max wheelchair distance: 150    Wheelchair 50 feet with 2 turns activity    Assist        Assist Level: Dependent - Patient 0%   Wheelchair 150 feet activity     Assist      Assist Level: Dependent - Patient 0%   Blood pressure 118/77, pulse 63, temperature 98.3 F (36.8 C), temperature source Oral, resp. rate 19, height 6' 4 (1.93 m), weight 88.9 kg, SpO2 100%.  Medical Problem List and Plan: 1. 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 Darnella of [facility] on 03/28/24:             -patient may not yet shower             -ELOS/Goals: 28-32 days, mod to max assist goals             -No collar needed per Dr. Darnella             -Bilateral PRAFO's, R WHO.  -I discussed with patient the basic approach  we will be taking with his rehab. He has had some improvement in his ASIA score since he was seen by Dr. Cornelio on 8/18. However, he will still likely require a power wheelchair as his primary means of mobility at discharge and will need significant assistance from family. He  seems motivated and willing to work, however.  D/c 4 weeks Trying to have family conference Thursday Con't CIR PT  and OT Seen Neuropsych- they agree with me pt is in denial about SCI. About about illicit usage.  2.  Antithrombotics: -DVT/anticoagulation:  Pharmaceutical: Lovenox  30mg  BID  -04/14/24 DVT U/S pending 9/1- is (-) for DVT             -antiplatelet therapy: ASA 81mg  daily 3. Pain Management: Cymbalta  30mg  nightly, Gabapentin  600mg  TID, Tylenol  1000mg  q6h, Robaxin  1000mg  q8h, oxycodone  10-15mg  q4h prn,  -Consider increasing Cymbalta  to 60mg  nightly for improved pain control if needed -Pt developing early hypertonicity in LE's. Will begin scheduling tizanidine  at 2mg  tid to start. Can have 2mg  tid prn as well -04/13/24 decrease tylenol  650mg  QID d/t transminitis as below 9/1- changed Cymbalta  to qday since had nightmares last night- didn't take trazodone . 9/3- still having nightmares, but not med related- might be PTSD? Vs subconscious dealing with his injury? 4. Mood/Behavior/Sleep: ego support             -antipsychotic agents: n/a             -Psych consult inpatient, no needs at that time -Pt will need emotional support. Seems to be pretty upbeat. Family sounds supportive. Will have Dr. Corina see pt at some point during admit  5. Neuropsych/cognition: This patient is capable of making decisions on his own behalf. 6. Skin/Wound Care/large forehead laceration: staples removed 04/04/24. Routine skin care.  7. Fluids/Electrolytes/Nutrition: routine I&O, routine labs, continue vitamins/supplements (Ensure plus high protein BID, MVI)             -Regular diet 8. 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 Lester of [facility], continue ASA 81mg  daily 9. Superior endplate compression fx T5, T7, T8, T9, B/L upper thoracic posterior paraspinal muscle injury and edema, interspinous ligament injury T2-3 and possibly T3-4: no intervention needed. Per Dr. Darnella.  10. B/L apical  pulmonary contusions, small pulmonary lac RLL: aggressive pulmonary toilet 11. L Ulnar styloid fx: NWB LUE, splint, f/up Dr. Murrell 12. Orthostatic hypotension: continue Midodrine  20mg  TID, Strattera  25mg  daily also started per Dr. Jyl recs with goal perhaps of coming off midodrine . -continue TEDs and abdominal binder -04/14/24 BPs improving, monitor 9/1-9/2in general BP's soft, in 100s-110s. Supine, but doing better- con't regimen 9/3- BP's running low 90's to 110's- no concern about OH currently per therapy Vitals:   04/12/24 1804 04/12/24 1950 04/13/24 0518 04/13/24 1621  BP: 101/63 (!) 93/59 93/63 111/70   04/13/24 2010 04/14/24 0528 04/14/24 1600 04/15/24 0517  BP: 102/69 107/70 (!) 158/90 106/74   04/15/24 1558 04/15/24 2002 04/16/24 0437 04/16/24 1532  BP: 113/76 101/65 93/61 118/77    13. Neurogenic bowel: on  bowel program: Dulcolax suppository 10mg  daily, Miralax  BID, SenokotS 1 tab BID             -will adjust bowel program to 1800 daily             -senokot-s 2 tabs q am and miralax  BID -04/14/24 LBM yesterday and maybe overnight but not documented 9/1- had large BM, but pt reports cleaned up multiple times- but not documented 9/2- 2 large Bms overnight with minimal results with bowel program itself 9/3- no results from bowel program- smear only x2- if goes another day, will need to be cleaned out 14. Neurogenic bladder: continue ISC q6h             -adjust I/O cath schedule to volumes of 300-500 cc -04/13/24 apparently used condom cath last night-- does feel urge to urinate,  no bladder scans documented but pt says they were done but no ISC needed -Reviewed with nursing the plan for him, no condom cath for now, attempt with urinal q4-6h and bladder scan -ISC if scan >250cc -04/14/24 so far no ISC needed! Low PVRs!  9/1- pt with low PVRs yesterday however always incontinent- will monitor 9/2- pt insistent can hold it- but hasn't been continent- yet-  9/3- cannot void on  command/ timed voiding- just incontinent- think he's overflowing, but cannot prove it.  15. Polysubstance use disorder: UDS +cocaine, THC, Benzos-- provide counseling 16. 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 F/up outpatient.  17. ABLA: hgb stable 8-9 range, monitor twice weekly  -04/13/24 Hgb 9.6, monitor 18. Transaminitis: -04/13/24 Alk phos 286, AST 58, ALT 153-- could be from shock liver, monitor again on Monday. Decrease tylenol  to 650mg  QID. No abd pain/n/v but consider imaging/work up if those develop.  9/1-  Down to 42 and 119 from 58 and 153- will reduction in Tylenol - -if not better Thursday, will reduce Tylenol  further   I spent a total of 50   minutes on total care today- >50% coordination of care- due to d/w pt about his lack of depression, about bladder- and d/w nursing as well as Neuropsych      LOS: 5 days A FACE TO FACE EVALUATION WAS PERFORMED  Wessley Emert 04/17/2024, 9:27 AM

## 2024-04-17 NOTE — Progress Notes (Signed)
 Occupational Therapy Session Note  Patient Details  Name: Tyrone Mckinney MRN: [id number] Date of Birth: 04-12-1981  Today's Date: 04/17/2024 OT Individual Time: 0945-1100 OT Individual Time Calculation (min): 75 min    Short Term Goals: Week 1:  OT Short Term Goal 1 (Week 1): Pt will roll laterally in bed with Max A x1 to assist with ADL routine. OT Short Term Goal 2 (Week 1): Pt will bathe face with Mod A using compensatory techniques/strategies as needed. OT Short Term Goal 3 (Week 1): Pt will self-feed with Mod A using compensatory techniques/strategies as needed.  Skilled Therapeutic Interventions/Progress Updates:    Pt resting in TIS w/c upon arrival. OT intervention with focus BUE function, core strengthening/sitting balance, education, and w/c positioning to increase pt's independence with BADLs. Trace LUE anterior deltiod activation this morning with minimal movement when LUE unweighted. Focus on anterior and posterior weight shifts in w/c. Pt able to initiate posterior UB weight shift with min A.   Saebo on RUE wrist extensors/flexors. Pt responds well but unable to replicate when Saebo removed  Saebo Stim One 330 pulse width 35 Hz pulse rate On 8 sec/ off 8 sec Ramp up/ down 2 sec Symmetrical Biphasic wave form  Max intensity at 500 Ohm load  OTA repositioned pt in w/c at midline. Pt with drift to Rt and unable to self-correct. Support provided behind pt's Rt shoulder lateral trunk.   Hydration system provided/hooked up to sip-n-puff system. Pt able to acces water as needed.  Pt remained in w/c with all needs within reach.    Therapy Documentation Precautions:  Precautions Precautions: Cervical, Fall Precaution/Restrictions Comments: MAP >65 Restrictions Weight Bearing Restrictions Per Provider Order: Yes LUE Weight Bearing Per Provider Order: Non weight bearing Other Position/Activity Restrictions: Forearm based thumb spica LUE (per ortho); WHO RUE, B  PRAFOs   Pain: Pt denies pain this morning   Therapy/Group: Individual Therapy  [name] 04/17/2024, 11:13 AM

## 2024-04-17 NOTE — Progress Notes (Signed)
 IP Rehab Bowel Program Documentation   Bowel Program Start time 1815  Dig Stim Indicated? Yes  Dig Stim Prior to Suppository or mini Enema X 1   Output from dig stim: Minimal  Ordered intervention: Suppository Yes , mini enema No ,   Repeat dig stim after Suppository or Mini enema  X 2,  Output? Minimal   Bowel Program Complete? Yes , handoff given Hadassah, RN  Patient Tolerated? Yes     Patient had a bowel movement earlier today with therapy.

## 2024-04-17 NOTE — Plan of Care (Signed)
 Bowel program maintained per order. Several episodes of urinary incontinence overnight. Bladder scans not > 300cc overnight. Pt ate dinner late (ab 22:00)- daughter brought food from [employer] to bedside. Pt with good appetite. Q2 Turns maintained on specialty mattress. 4 bedrails up on specialty mattress as fall precaution per protocol. Bilateral lower extremity PRAFO boots in place. Left arm splint in place. SCDs & TED hose in place per order.   Problem: Consults Goal: RH SPINAL CORD INJURY PATIENT EDUCATION Description:  See Patient Education module for education specifics.  Outcome: Progressing   Problem: SCI BOWEL ELIMINATION Goal: RH STG MANAGE BOWEL WITH ASSISTANCE Description: STG Manage Bowel with max Assistance. Outcome: Progressing   Problem: SCI BLADDER ELIMINATION Goal: RH STG MANAGE BLADDER WITH ASSISTANCE Description: STG Manage Bladder With max Assistance Outcome: Progressing   Problem: RH SKIN INTEGRITY Goal: RH STG SKIN FREE OF INFECTION/BREAKDOWN Description: Manage skin free of infection with max assistance Outcome: Progressing   Problem: RH SAFETY Goal: RH STG ADHERE TO SAFETY PRECAUTIONS W/ASSISTANCE/DEVICE Description: STG Adhere to Safety Precautions With max  Assistance/Device. Outcome: Progressing   Problem: RH PAIN MANAGEMENT Goal: RH STG PAIN MANAGED AT OR BELOW PT'S PAIN GOAL Description: <4 w/ prns Outcome: Progressing   Problem: RH KNOWLEDGE DEFICIT SCI Goal: RH STG INCREASE KNOWLEDGE OF SELF CARE AFTER SCI Description: Manage increase knowledge of self care after SCI with max assistance from spouse/ family using educational materials  provided.  Outcome: Progressing

## 2024-04-17 NOTE — Discharge Summary (Signed)
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 105.  Patient advised to avoid Tylenol  at home.   Spacticity: Fair control with regimen Baclofen  and Zanaflex .                                                                   Rehab course: During patient's stay in rehab weekly team conferences were held to monitor patient's progress, set goals and discuss barriers to discharge. At admission, patient required total assist for all mobility/adls.    Occupational Therapy:  Patient met 1 of 5 long-term goals making limited progress with self-care during  admission and requires total assistance for bathing and shower level and rolling shower chair, total assistance for dressing at bed/wheelchair level.  He is able to assist with feeding/toothbrushing max assist.  To be transfers with total a +2.  He will benefit from ongoing OT services and home health setting to continue to advance functional skills in the area of BADL and reduce care partner burden.    Physical Therapy: Patient has met 3 of 5 long term goals due to improved activity tolerance, improved balance, improved postural control, increased strength, decreased pain, and ability to compensate for deficits.  Patient to discharge at a wheelchair level S for mobility but total +2 for mobility and will benefit from ongoing skilled PT services and home health setting.  Patient's care partner is independent to provide the necessary physical assistance at discharge and completed family education prior to discharge.     Disposition:  Discharge disposition: 01-Home or Self Care        Diet: regular diet   Special Instructions:  -No driving or operating heavy machinery until cleared by provider  -No smoking or alcohol or illicit drug use    Discharge Instructions     Ambulatory referral to Physical Medicine Rehab   Complete by: As directed    Need appointment with Lovorn      Allergies as of 05/14/2024   No Known Allergies      Medication List     TAKE these medications    apixaban 2.5 MG Tabs tablet Commonly known as: ELIQUIS Take 1 tablet (2.5 mg total) by mouth 2 (two) times daily.   ascorbic acid  1000 MG tablet Commonly known as: VITAMIN C  Take 1 tablet (1,000 mg total) by mouth daily.   aspirin  EC 81 MG tablet Take 1 tablet (81 mg total) by mouth daily. Swallow whole.   atomoxetine  25 MG capsule Commonly known as: STRATTERA  Take 1 capsule (25 mg total) by mouth daily.   baclofen  10 MG tablet Commonly known as: LIORESAL  Take 1 tablet (10 mg total) by mouth 3  (three) times daily.   bisacodyl  10 MG suppository Commonly known as: DULCOLAX Place 1 suppository (10 mg total) rectally daily at 6 PM.   diclofenac  Sodium 1 % Gel Commonly known as: VOLTAREN  Apply 4 g topically 4 (four) times daily. Notes to patient: To bilateral shoulders   DULoxetine  60 MG capsule Commonly known as: CYMBALTA  Take 1 capsule (60 mg total) by mouth daily.   feeding supplement Liqd Take 237 mLs by mouth 4 (four) times daily -  with meals and at bedtime.   gabapentin  300 MG capsule Commonly known as: NEURONTIN  Take 2 capsules (600 mg total) by mouth 3 (three) times daily.   hydrocerin Crea Apply 1 Application topically daily.   leptospermum manuka honey Pste paste Apply 1 Application topically daily.   methocarbamol  500 MG tablet Commonly known as: ROBAXIN  Take 2 tablets (1,000 mg total) by mouth every 8 (eight) hours as needed for muscle spasms (can take with baclofen - is for CENTRAL/torso/neck tightness- baclofen  for extremity spasms).   midodrine  10 MG tablet Commonly known as: PROAMATINE  Take 2 tablets (20 mg total) by mouth 3 (three) times daily with meals.   mouth rinse Liqd solution 15 mLs by Mouth Rinse route 4 (four) times daily.   multivitamin with minerals Tabs tablet Take 1 tablet by mouth daily.   Oxycodone  HCl 10 MG Tabs Take  1-1.5 tablets (10-15 mg total) by mouth every 4 (four) hours as needed for moderate pain (pain score 4-6) or severe pain (pain score 7-10) (10mg  for moderate pain, 15mg  for severe pain). Notes to patient: Limit to 6 pills per day AS NEEDED FOR SEVERE PAIN.   polyethylene glycol powder 17 GM/SCOOP powder Commonly known as: GLYCOLAX /MIRALAX  Dissolve 1 capful (17g) in 4-8 ounces of liquid and take by mouth daily.   senna-docusate 8.6-50 MG tablet Commonly known as: Senokot-S Take 3 tablets by mouth daily.   tiZANidine  4 MG tablet Commonly known as: ZANAFLEX  Take 1 tablet (4 mg total) by mouth 3 (three) times  daily.   Zinc  Sulfate 220 (50 Zn) MG Tabs Take 1 tablet (220 mg total) by mouth daily.        Follow-up Information     Murrell Kuba, MD Follow up.   Specialty: Orthopedic Surgery Contact information: [address] [place] [address] [phone]         Cornelio Bouchard, MD Follow up.   Specialty: Physical Medicine and Rehabilitation Why: office will call you with follow up appointment Contact information: [address] [place] [address] [phone]         Sol Mackey POUR, MD Follow up.   Specialty: Family Medicine Why: Call office to make an appointment.  Please bring all current medication, ID and insurance card. Contact information: [address] [place] [address] [phone]         Darnella Dorn SAUNDERS, MD Follow up.   Specialty: Neurosurgery Why: Please call to arrange 6-week follow-up in Dr. Shelbie clinic. Contact information: [address] [place] [address] [phone]                 Signed: Daphne LITTIE Satterfield 05/14/2024, 9:32 AM

## 2024-04-17 NOTE — Plan of Care (Signed)
  Problem: Consults Goal: RH SPINAL CORD INJURY PATIENT EDUCATION Description:  See Patient Education module for education specifics.  Outcome: Progressing   Problem: SCI BLADDER ELIMINATION Goal: RH STG MANAGE BLADDER WITH ASSISTANCE Description: STG Manage Bladder With max Assistance Outcome: Progressing   Problem: RH SAFETY Goal: RH STG ADHERE TO SAFETY PRECAUTIONS W/ASSISTANCE/DEVICE Description: STG Adhere to Safety Precautions With max  Assistance/Device. Outcome: Progressing

## 2024-04-18 LAB — COMPREHENSIVE METABOLIC PANEL WITH GFR
ALT: 121 U/L — ABNORMAL HIGH (ref 0–44)
AST: 43 U/L — ABNORMAL HIGH (ref 15–41)
Albumin: 2.9 g/dL — ABNORMAL LOW (ref 3.5–5.0)
Alkaline Phosphatase: 257 U/L — ABNORMAL HIGH (ref 38–126)
Anion gap: 13 (ref 5–15)
BUN: 18 mg/dL (ref 6–20)
CO2: 26 mmol/L (ref 22–32)
Calcium: 9.2 mg/dL (ref 8.9–10.3)
Chloride: 101 mmol/L (ref 98–111)
Creatinine, Ser: 0.67 mg/dL (ref 0.61–1.24)
GFR, Estimated: >60 mL/min (ref >60)
Glucose, Bld: 98 mg/dL (ref 70–99)
Potassium: 4.2 mmol/L (ref 3.5–5.1)
Sodium: 140 mmol/L (ref 135–145)
Total Bilirubin: 0.5 mg/dL (ref 0.0–1.2)
Total Protein: 7.2 g/dL (ref 6.5–8.1)

## 2024-04-18 LAB — CBC
HCT: 29.8 % — ABNORMAL LOW (ref 39.0–52.0)
Hemoglobin: 10 g/dL — ABNORMAL LOW (ref 13.0–17.0)
MCH: 30.6 pg (ref 26.0–34.0)
MCHC: 33.6 g/dL (ref 30.0–36.0)
MCV: 91.1 fL (ref 80.0–100.0)
Platelets: 496 K/uL — ABNORMAL HIGH (ref 150–400)
RBC: 3.27 MIL/uL — ABNORMAL LOW (ref 4.22–5.81)
RDW: 12.2 % (ref 11.5–15.5)
WBC: 6.1 K/uL (ref 4.0–10.5)
nRBC: 0 % (ref 0.0–0.2)

## 2024-04-18 MED ORDER — BACLOFEN 5 MG HALF TABLET: 5.0000 mg | ORAL_TABLET | Freq: Three times a day (TID) | ORAL | Status: DC

## 2024-04-18 MED ORDER — BACLOFEN 5 MG HALF TABLET
5.0000 mg | ORAL_TABLET | Freq: Three times a day (TID) | ORAL | Status: DC
  Administered 2024-04-18 – 2024-04-19 (×3): 5 mg via ORAL
  Filled 2024-04-18 (×3): qty 1

## 2024-04-18 MED ORDER — METHOCARBAMOL 500 MG PO TABS
1000.0000 mg | ORAL_TABLET | Freq: Three times a day (TID) | ORAL | Status: DC | PRN
  Administered 2024-04-19 – 2024-05-13 (×17): 1000 mg via ORAL
  Filled 2024-04-18 (×19): qty 2

## 2024-04-18 MED ORDER — ACETAMINOPHEN 325 MG PO TABS
325.0000 mg | ORAL_TABLET | Freq: Four times a day (QID) | ORAL | Status: DC | PRN
  Administered 2024-05-04: 325 mg via ORAL
  Filled 2024-04-18 (×4): qty 1

## 2024-04-18 NOTE — Progress Notes (Signed)
 Physical Therapy Session Note  Patient Details  Name: Tyrone Mckinney MRN: [id number] Date of Birth: 1981-07-23  Today's Date: 04/18/2024 PT Individual Time: 1303-1405 PT Individual Time Calculation (min): 62 min   Short Term Goals: Week 1:  PT Short Term Goal 1 (Week 1): Pt will roll to Left w/ total A consistently. PT Short Term Goal 2 (Week 1): Pt will tolerate OOB in w/c x 2 hours. PT Short Term Goal 3 (Week 1): Pt will tolerate sitting EOB x 5' w/ total A.  Skilled Therapeutic Interventions/Progress Updates: Pt presented in w/c with dgt present agreeable to therapy. Pt states some increase in neck pain, nsg notified and pain meds received during session. Pt transported to day room dependently. Pt set up with Kinetron and worked on R quad and hamstring activation. Kinetron initially set up at 90cm/sec with PTA applying pressure on opposite pedal. Pt was able to depress but noted using primarily forefoot. Pt then instructed to DF and push with heel only. Pt was able to successfully demonstrate small push against resistance. PTA then increased pressure to 50cm/sec, pt then able to depress to neutral with PTA foot on pedal and then instructed to attempt to depress pedal further with success. Activity performed to fatigue. Pt then moved over to high/low mat. Completed Slide board transfer total A with pt instructed to depress RLE to initiate ms response and initiate habit of pushing with RLE when performing Slide board transfers. On mat pt worked on sitting balance with pt requiring near total A initially. Pt was able to place RUE on mat and with max A for positioning was able to achieve static sitting for ~10sec x 2! Pt also noted to be able to perform core activation to move anteriorly in small range. Pt sat on mat ~15 min with intermittent rest breaks. Pt completed transfer back to TIS total A x 1 with +2 for safety. Pt transported back to room and completed transfer back to bed in same manner as  prior. Pt completed rolling total A for adjustment of pad. Pt noted to be incontinent of bladder. Pt repositioned to comfort with pillows supporting BUE. Pt left in bed with sip and puff in place and nsg notified of pt's disposition.      Therapy Documentation Precautions:  Precautions Precautions: Cervical, Fall Precaution/Restrictions Comments: MAP >65 Restrictions Weight Bearing Restrictions Per Provider Order: Yes LUE Weight Bearing Per Provider Order: Non weight bearing Other Position/Activity Restrictions: Forearm based thumb spica LUE (per ortho); WHO RUE, B PRAFOs General:   Vital Signs: Therapy Vitals Pulse Rate: 80 BP: 100/71 Pain: Pain Assessment Pain Scale: 0-10 Pain Score: 8  Pain Location: Neck Pain Intervention(s): Medication (See eMAR)    Therapy/Group: Individual Therapy  Lavoris Sparling 04/18/2024, 4:05 PM

## 2024-04-18 NOTE — Plan of Care (Signed)
  Problem: Consults Goal: RH SPINAL CORD INJURY PATIENT EDUCATION Description:  See Patient Education module for education specifics.  Outcome: Progressing   Problem: SCI BOWEL ELIMINATION Goal: RH STG MANAGE BOWEL WITH ASSISTANCE Description: STG Manage Bowel with max Assistance. Outcome: Progressing   Problem: SCI BLADDER ELIMINATION Goal: RH STG MANAGE BLADDER WITH ASSISTANCE Description: STG Manage Bladder With max Assistance Outcome: Progressing   Problem: RH SKIN INTEGRITY Goal: RH STG SKIN FREE OF INFECTION/BREAKDOWN Description: Manage skin free of infection with max assistance Outcome: Progressing   Problem: RH SAFETY Goal: RH STG ADHERE TO SAFETY PRECAUTIONS W/ASSISTANCE/DEVICE Description: STG Adhere to Safety Precautions With max  Assistance/Device. Outcome: Progressing   Problem: RH PAIN MANAGEMENT Goal: RH STG PAIN MANAGED AT OR BELOW PT'S PAIN GOAL Description: <4 w/ prns Outcome: Progressing   Problem: RH KNOWLEDGE DEFICIT SCI Goal: RH STG INCREASE KNOWLEDGE OF SELF CARE AFTER SCI Description: Manage increase knowledge of self care after SCI with max assistance from spouse/ family using educational materials  provided.  Outcome: Progressing

## 2024-04-18 NOTE — Progress Notes (Signed)
..  IP Rehab Bowel Program Documentation   Bowel Program Start time 1820  Dig Stim Indicated? Yes  Dig Stim Prior to Suppository or mini Enema X 5   Output from dig stim: Minimal  Ordered intervention: Suppository Yes , mini enema No ,   Repeat dig stim after Suppository or Mini enema  X 4,  Output? Minimal   Bowel Program Complete? Handoff/No bowel movement, handoff given Yes  Patient Tolerated? Yes   Hyperflexia and spacticity with anus and rectal sphincter with initial digital stimulation. No stool present during first round of digital stimulation. Suppository placed. Educated about importance of bowel program. Educated about importance of managing pain and how it can affect blood pressure.  Per report from nightshift patient did not have a bowel movement overnight.

## 2024-04-18 NOTE — Progress Notes (Signed)
 Patient ID: Tyrone Mckinney, male   DOB: [birth year], 43 y.o.   MRN: [id number]  Patient/Family Conference  Patient/family in attendance: Wife- Lyle, dtr [name], brother- Darren via conference call.   Staff in attendance: Dr Lovorn (attending), Schuyler Batter (PT) and Oneil (PT student), Nereida Brash (OT), and Graeme Jude (SW)   Main focus: overview of current medical condition, and support needed at discharge.   Synopsis of information shared: Attending explained in length current diagnosis of being an ASIA C, neurotogenic bowel/bladder, I/) cathing (if needed), AD, potential BP issues, spasticity, and recommendation for being current with all vaccines due to risk for respiratory infection. PT discussed d/c at w/c level and specialty w/c recommended, and a ramp will be needed. OT reviewed self-care/eating/toileting and hopeful there will be some hand function that returns so he can do a little more for himself. Discussed home prep for home to ensure they can manage the DME that will be needed.   Barriers/concerns expressed by patient and family:  DME without insurance and getting ramp built.   Patient/family response: Family and patient have a general understanding of care that pt will require.  Follow-up/action plans: Family will begin to look for DME- hospital bed, hoyer lift, bariatric DABSC, and building a ramp. Brainstormed on potential options. Family understands there will be family edu closer towards discharge date.   *SW provided pt dtr with home measurement form and ramp info.   Graeme Jude, MSW, LCSW Office: [phone] Cell: [phone] Fax: [phone]

## 2024-04-18 NOTE — Progress Notes (Signed)
 PROGRESS NOTE   Subjective/Complaints:  Pt reports he's good.   LBM yesterday- large per pt - medium per chart at 1300- smear with bowel program.   Lots of spasms this AM- got meds and was better after got Am meds-  Will add Baclofen  for spasticity  No low BP lately per therapy.   LFTs still elevated and not getting lower anymore.   ROS:  Per HPI   Pt denies SOB, abd pain, CP, N/V/C/D, and vision changes      Objective:   No results found.  Recent Labs    04/18/24 0551  WBC 6.1  HGB 10.0*  HCT 29.8*  PLT 496*   Recent Labs    04/18/24 0551  NA 140  K 4.2  CL 101  CO2 26  GLUCOSE 98  BUN 18  CREATININE 0.67  CALCIUM  9.2        Intake/Output Summary (Last 24 hours) at 04/18/2024 1056 Last data filed at 04/18/2024 0438 Gross per 24 hour  Intake --  Output 425 ml  Net -425 ml         Physical Exam: Vital Signs Blood pressure 97/70, pulse (!) 59, temperature 98.1 F (36.7 C), resp. rate 18, height 6' 4 (1.93 m), weight 88.9 kg, SpO2 98%.        General: awake, alert, appropriate, supine in bed;  NAD HENT: conjugate gaze; oropharynx moist CV: regular rate and rhythm- rate on low side; no JVD Pulmonary: CTA B/L; no W/R/R- good air movement- somewhat weak cough- rare GI: soft, NT, ND, (+)BS Psychiatric: appropriate- brighter than expected affect Neurological: Ox3 Spasms In LE's  Ext: no clubbing, cyanosis, or edema, LUE wrist splint Psych: pleasant and cooperative Skin: dry, warm, bruises   PRIOR EXAMS: Eyes:     Comments: Right scleral hemorrhage  Genitourinary:    Comments: Incontinent of urine, condom loose Neurological:     Mental Status: He is alert.     Comments: Alert and oriented x 3. Normal insight and awareness. Intact Memory. Normal language and speech. Cranial nerve exam unremarkable. MMT: RUE: delt 2+/5, biceps 3- to 3/5, triceps, wrist and HI all 0/5.  LUE: delt  1/5, biceps 2/5, triceps, wrist, HI all 0/5. RLE: 1/5 HE and HAD, 0/5 everywhere else. LLE: 2/5 HF, HAD and KE, ADF/PF 2+/5. Sensory exam diminished below C5 on right and ~C6 on left. 1/2 sensation in trunk and both LE, as well as inguinal/rectal area. Sensed sl more in LLE. DTR's 1+ in BUE and 3+ in BLE. Pt with significant extensor and hip adductor tone in both legs. 4-5 beats of clonus RLE and 2-3 in LLE.     Assessment/Plan: 1. Functional deficits which require 3+ hours per day of interdisciplinary therapy in a comprehensive inpatient rehab setting. Physiatrist is providing close team supervision and 24 hour management of active medical problems listed below. Physiatrist and rehab team continue to assess barriers to discharge/monitor patient progress toward functional and medical goals  Care Tool:  Bathing        Body parts bathed by helper: Buttocks, Right upper leg, Left upper leg, Right lower leg, Left lower leg, Face, Right arm, Chest, Left  arm, Abdomen, Front perineal area     Bathing assist Assist Level: Dependent - Patient 0%     Upper Body Dressing/Undressing Upper body dressing   What is the patient wearing?: Pull over shirt    Upper body assist Assist Level: Dependent - Patient 0%    Lower Body Dressing/Undressing Lower body dressing      What is the patient wearing?: Incontinence brief     Lower body assist Assist for lower body dressing: Dependent - Patient 0%     Toileting Toileting    Toileting assist Assist for toileting: Dependent - Patient 0%     Transfers Chair/bed transfer  Transfers assist     Chair/bed transfer assist level: Dependent - mechanical lift     Locomotion Ambulation   Ambulation assist   Ambulation activity did not occur: Safety/medical concerns          Walk 10 feet activity   Assist  Walk 10 feet activity did not occur: Safety/medical concerns        Walk 50 feet activity   Assist Walk 50 feet with 2  turns activity did not occur: Safety/medical concerns         Walk 150 feet activity   Assist Walk 150 feet activity did not occur: Safety/medical concerns         Walk 10 feet on uneven surface  activity   Assist Walk 10 feet on uneven surfaces activity did not occur: Safety/medical concerns         Wheelchair     Assist Is the patient using a wheelchair?: Yes Type of Wheelchair: Manual    Wheelchair assist level: Dependent - Patient 0% Max wheelchair distance: 150    Wheelchair 50 feet with 2 turns activity    Assist        Assist Level: Dependent - Patient 0%   Wheelchair 150 feet activity     Assist      Assist Level: Dependent - Patient 0%   Blood pressure 97/70, pulse (!) 59, temperature 98.1 F (36.7 C), resp. rate 18, height 6' 4 (1.93 m), weight 88.9 kg, SpO2 98%.  Medical Problem List and Plan: 1. 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 Darnella of [facility] on 03/28/24:             -patient may not yet shower             -ELOS/Goals: 28-32 days, mod to max assist goals             -No collar needed per Dr. Darnella             -Bilateral PRAFO's, R WHO.  -I discussed with patient the basic approach  we will be taking with his rehab. He has had some improvement in his ASIA score since he was seen by Dr. Cornelio on 8/18. However, he will still likely require a power wheelchair as his primary means of mobility at discharge and will need significant assistance from family. He seems motivated and willing to work, however.  D/c 4 weeks Team conference today- went over C4 ASIA C, low chance of getting muscles back that doesn't have currently; Spasticity; Neurogenic Bowel and bladder,  Risk of AD, OH; and LFT's and fracture of  L wrist- also increased risk of DVT/PE Seen Neuropsych- they agree with me pt is in denial about SCI. About about illicit usage.  2.  Antithrombotics: -DVT/anticoagulation:  Pharmaceutical: Lovenox  30mg  BID  -04/14/24 DVT U/S pending 9/1- is (-) for DVT 9/4- needs Elqiuis or Lovenox  for a total of 3 months             -antiplatelet therapy: ASA 81mg  daily 3. Pain Management: Cymbalta  30mg  nightly, Gabapentin  600mg  TID, Tylenol  1000mg  q6h, Robaxin  1000mg  q8h, oxycodone  10-15mg  q4h prn,  -Consider increasing Cymbalta  to 60mg  nightly for improved pain control if needed -Pt developing early hypertonicity in LE's. Will begin scheduling tizanidine  at 2mg  tid to start. Can have 2mg  tid prn as well -04/13/24 decrease tylenol  650mg  QID d/t transminitis as below 9/1- changed Cymbalta  to qday since had nightmares last night- didn't take trazodone . 9/3- still having nightmares, but not med related- might be PTSD? Vs subconscious dealing with his injury? 4. Mood/Behavior/Sleep: ego support             -antipsychotic agents: n/a             -Psych consult inpatient, no needs at that time -Pt will need emotional support. Seems to be pretty upbeat. Family sounds supportive. Will have Dr. Corina see pt at some point during admit  9/4- D/w pt and family today that he's very 'bright- but I'm expecting him to hit a wall when all this really hits.  5. Neuropsych/cognition: This patient is capable of making decisions on his own behalf. 6. Skin/Wound Care/large forehead laceration: staples removed 04/04/24. Routine skin care.  7. Fluids/Electrolytes/Nutrition: routine I&O, routine labs, continue vitamins/supplements (Ensure plus high protein BID, MVI)             -Regular diet 8. 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 Lester of [facility], continue ASA 81mg  daily 9. Superior endplate compression fx T5, T7, T8, T9, B/L upper thoracic posterior paraspinal muscle  injury and edema, interspinous ligament injury T2-3 and possibly T3-4: no intervention needed. Per Dr. Darnella.  10. B/L apical pulmonary contusions, small pulmonary lac RLL: aggressive pulmonary toilet 11. L Ulnar styloid fx: NWB LUE, splint, f/up Dr. Murrell  9/4- when gets closer to 6 weeks, will call Dr Marjorie to go over when can WBAT 12. Orthostatic hypotension: continue Midodrine  20mg  TID, Strattera  25mg  daily also started per Dr. Jyl recs with goal perhaps of coming off midodrine . -continue TEDs and abdominal binder -04/14/24 BPs improving, monitor 9/1-9/2in general BP's soft, in 100s-110s. Supine, but doing better- con't regimen 9/3-9/4 BP's running low 90's to 110's- no concern about OH currently per therapy Vitals:   04/13/24 1621 04/13/24 2010 04/14/24 0528 04/14/24 1600  BP: 111/70 102/69 107/70 (!) 158/90   04/15/24 0517 04/15/24 1558 04/15/24 2002 04/16/24 0437  BP: 106/74 113/76 101/65 93/61   04/16/24 1532 04/17/24 1626 04/17/24 2030 04/18/24 0428  BP: 118/77 (!) 166/92 109/75 97/70    13. Neurogenic bowel: on  bowel program: Dulcolax suppository 10mg  daily, Miralax  BID, SenokotS 1 tab BID             -will adjust bowel program to 1800 daily             -senokot-s 2 tabs q am and miralax  BID -04/14/24 LBM yesterday and maybe overnight but not documented 9/1- had large BM, but pt reports cleaned up multiple times- but not documented 9/2- 2 large Bms overnight with minimal results with bowel program itself  9/3- no results from bowel program- smear only x2- if goes another day, will need to be cleaned out 9/4- medium BM yesterday- smear with bowel program 14. Neurogenic bladder: continue ISC q6h             -adjust I/O cath schedule to volumes of 300-500 cc -04/13/24 apparently used condom cath last night-- does feel urge to urinate, no bladder scans documented but pt says they were done but no ISC needed -Reviewed with nursing the plan for him, no condom cath for now, attempt  with urinal q4-6h and bladder scan -ISC if scan >250cc -04/14/24 so far no ISC needed! Low PVRs!  9/1- pt with low PVRs yesterday however always incontinent- will monitor 9/2- pt insistent can hold it- but hasn't been continent- yet-  9/3- cannot void on command/ timed voiding- just incontinent- think he's overflowing, but cannot prove it.  9/4- still not voiding with toileting 15. Polysubstance use disorder: UDS +cocaine, THC, Benzos-- provide counseling 16. 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 F/up outpatient.  17. ABLA: hgb stable 8-9 range, monitor twice weekly  -04/13/24 Hgb 9.6, monitor 18. Transaminitis: -04/13/24 Alk phos 286, AST 58, ALT 153-- could be from shock liver, monitor again on Monday. Decrease tylenol  to 650mg  QID. No abd pain/n/v but consider imaging/work up if those develop.  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  9/4 - pt had BP yesterday of 166/92- pt admits had HA- so had AD- educated pt and family on AD- will continue to educate staff, because we were not called on the probable AD episode yesterday- 166/92 was BP- isolated spike in BP = AD esp in pt that has OH- went over with PA and staff about how to treat- sit pt up, loosen tight clothing, then if doesn't improve, cath him; do bowel program- that's 80% of all AD causes- if not improved, let PA/Doctor know  I spent a total of 59   minutes on total care today- >50% coordination of care- due to  D/w pt and staff this AM about pt and AD- also d/w nursing coordinator- also 35 minutes on direct care for family conference as well.   LOS: 6 days A FACE TO FACE EVALUATION WAS  PERFORMED  Breyanna Valera 04/18/2024, 10:56 AM

## 2024-04-18 NOTE — Progress Notes (Signed)
 Occupational Therapy Session Note  Patient Details  Name: Tyrone Mckinney MRN: [id number] Date of Birth: 1981-07-23  Today's Date: 04/18/2024 OT Co-Treatment Time: 1020-1030 OT Co-Treatment Time Calculation (min): 10 min  Short Term Goals: Week 1:  OT Short Term Goal 1 (Week 1): Pt will roll laterally in bed with Max A x1 to assist with ADL routine. OT Short Term Goal 2 (Week 1): Pt will bathe face with Mod A using compensatory techniques/strategies as needed. OT Short Term Goal 3 (Week 1): Pt will self-feed with Mod A using compensatory techniques/strategies as needed.  Skilled Therapeutic Interventions/Progress Updates:  Pt greeted sitting in TIS [facility] with family/caregivers present for family conference. MD, PT, and CSW also in attendance. OT provides education on patient's current level of functioning, POC, and anticipated caregiver support/equipment needs. Pt remained sitting in TIS WC with CSW present.   Therapy Documentation Precautions:  Precautions Precautions: Cervical, Fall Precaution/Restrictions Comments: MAP >65 Restrictions Weight Bearing Restrictions Per Provider Order: Yes LUE Weight Bearing Per Provider Order: Non weight bearing Other Position/Activity Restrictions: Forearm based thumb spica LUE (per ortho); WHO RUE, B PRAFOs   Therapy/Group: Individual Therapy  Nereida Habermann, OTR/L, MSOT  04/18/2024, 4:10 PM

## 2024-04-18 NOTE — Progress Notes (Signed)
 Met with patient and patients daughter to review current situation, team conference and plan of care. Reviewed medications, bowel and bladder program, skin checks, hypotension, incision care. Continue to follow along to provide educational needs to facilitate preparation for discharge.

## 2024-04-18 NOTE — Progress Notes (Signed)
 Physical Therapy Session Note  Patient Details  Name: Tyrone Mckinney MRN: [id number] Date of Birth: 12/15/1980  Today's Date: 04/18/2024 PT Individual Time: 0915-1000 PT Individual Time Calculation (min): 45 min  and Today's Date: 04/18/2024 PT Co-Treatment Time: 1000-1015 PT Co-Treatment Time Calculation (min): 15 min  Short Term Goals: Week 1:  PT Short Term Goal 1 (Week 1): Pt will roll to Left w/ total A consistently. PT Short Term Goal 2 (Week 1): Pt will tolerate OOB in w/c x 2 hours. PT Short Term Goal 3 (Week 1): Pt will tolerate sitting EOB x 5' w/ total A.  Skilled Therapeutic Interventions/Progress Updates:    pt received in bed and agreeable to therapy. No complaint of pain. Session focused on bed mobility and transfer with transfer board. Donned shoes tot a. Pt required tot a x 2 with supine>sit and max-tot for bed mobility EOB. Tot a x 2 for slideboard transfer with assist for all parts of transfer. Pt with noted difficulty coordinating pushing with LLE to assist with transfer, typically lifting heel vs driving down to lift. Time spent problem solving and working on anterior lean>assisted lift to improve LLE participation in transfer.   Pt then seen as co-treat with OT as part of family conference. Also present were MD, CSW, and nsg care coordinator. This therapist provided education on expected level of assist at d/c, anticipated equipment needs, and family training.  Pt remained in TIS w/c with family present and needs in reach.   Therapy Documentation Precautions:  Precautions Precautions: Cervical, Fall Precaution/Restrictions Comments: MAP >65 Restrictions Weight Bearing Restrictions Per Provider Order: Yes LUE Weight Bearing Per Provider Order: Non weight bearing Other Position/Activity Restrictions: Forearm based thumb spica LUE (per ortho); WHO RUE, B PRAFOs General:       Therapy/Group: Individual Therapy  [name] 04/18/2024, 2:34 PM

## 2024-04-18 NOTE — Progress Notes (Signed)
 Occupational Therapy Session Note  Patient Details  Name: Tyrone Mckinney MRN: [id number] Date of Birth: 27-Jul-1981  Today's Date: 04/18/2024 OT Individual Time: 9261-9150 OT Individual Time Calculation (min): 71 min    Short Term Goals: Week 1:  OT Short Term Goal 1 (Week 1): Pt will roll laterally in bed with Max A x1 to assist with ADL routine. OT Short Term Goal 2 (Week 1): Pt will bathe face with Mod A using compensatory techniques/strategies as needed. OT Short Term Goal 3 (Week 1): Pt will self-feed with Mod A using compensatory techniques/strategies as needed.  Skilled Therapeutic Interventions/Progress Updates:  Pt greeted resting in bed, reports of 10/10 pain in neck, LPN present to administer medications at end of session. OT provides rest and repositioning throughout to manage. Of note, patient with increase in BLE spasms during session, anticipate due to delay in medications, PT/MD made aware. Session focused on BADL participation. OT provides distal support at R-elbow/hand for integration into bed-level oral care, total A provided for thorough cleaning. Pt with x2 episodes of bladder incontinence during session. Total A provided for LB garment change and cleansing with Total A x2 for rolling during care. Increased difficulty doffing sleeveless top due to size, educated on purchasing looser fitted clothing for ease/pain management with overhead doffing. Reviewed importance for started to self-advocate for needs and directing care as his knowledge of SCI and current functioning improves. Pt receptive. Pt remained in care of LPN/RN for completion of care.   Therapy Documentation Precautions:  Precautions Precautions: Cervical, Fall Precaution/Restrictions Comments: MAP >65 Restrictions Weight Bearing Restrictions Per Provider Order: Yes LUE Weight Bearing Per Provider Order: Non weight bearing Other Position/Activity Restrictions: Forearm based thumb spica LUE (per ortho); WHO  RUE, B PRAFOs   Therapy/Group: Individual Therapy  Nereida Habermann, OTR/L, MSOT  04/18/2024, 7:27 AM

## 2024-04-19 MED ORDER — DOCUSATE SODIUM 100 MG PO CAPS
100.0000 mg | ORAL_CAPSULE | Freq: Every day | ORAL | Status: DC
  Administered 2024-04-19 – 2024-04-26 (×7): 100 mg via ORAL
  Filled 2024-04-19 (×8): qty 1

## 2024-04-19 MED ORDER — SENNOSIDES-DOCUSATE SODIUM 8.6-50 MG PO TABS
3.0000 | ORAL_TABLET | Freq: Every day | ORAL | Status: DC
  Administered 2024-04-20 – 2024-04-23 (×4): 3 via ORAL
  Filled 2024-04-19 (×4): qty 3

## 2024-04-19 MED ORDER — SORBITOL 70 % SOLN
60.0000 mL | Freq: Once | Status: AC
  Administered 2024-04-19: 60 mL via ORAL
  Filled 2024-04-19 (×2): qty 60

## 2024-04-19 MED ORDER — BACLOFEN 10 MG PO TABS
10.0000 mg | ORAL_TABLET | Freq: Three times a day (TID) | ORAL | Status: DC
  Administered 2024-04-19 – 2024-05-14 (×73): 10 mg via ORAL
  Filled 2024-04-19 (×74): qty 1

## 2024-04-19 MED ORDER — SORBITOL 70 % SOLN
60.0000 mL | Freq: Once | Status: AC
  Administered 2024-04-19: 60 mL via ORAL

## 2024-04-19 NOTE — Progress Notes (Shared)
..  IP Rehab Bowel Program Documentation   Bowel Program Start time [phone]  Dig Stim Indicated? Yes  Dig Stim Prior to Suppository or mini Enema X 5   Output from dig stim: Minimal  Ordered intervention: Suppository Yes , mini enema No ,   Repeat dig stim after Suppository or Mini enema  X {Numbers; 1-5:17750},  Output? {Desc; minimal/small/moderate/large/very large:110034}   Bowel Program Complete? {YES/NO:21197}, handoff given Rosaline, RN  Patient Tolerated? {YES/NO:21197}   Initially agreeable to the soap suds enema. Refused soap sud enema at the start of the bowel program. Has been educated and made aware importance of the enema and reason for doing the enema. No stool present during digital stimulation. One to two episodes of flatulence. Hyperflexia and spacticity with anus and rectal sphincter with initial digital stimulation.

## 2024-04-19 NOTE — Plan of Care (Signed)
  Problem: Consults Goal: RH SPINAL CORD INJURY PATIENT EDUCATION Description:  See Patient Education module for education specifics.  Outcome: Progressing   Problem: SCI BLADDER ELIMINATION Goal: RH STG MANAGE BLADDER WITH ASSISTANCE Description: STG Manage Bladder With max Assistance Outcome: Progressing   Problem: RH SKIN INTEGRITY Goal: RH STG SKIN FREE OF INFECTION/BREAKDOWN Description: Manage skin free of infection with max assistance Outcome: Progressing   Problem: RH SAFETY Goal: RH STG ADHERE TO SAFETY PRECAUTIONS W/ASSISTANCE/DEVICE Description: STG Adhere to Safety Precautions With max  Assistance/Device. Outcome: Progressing   Problem: RH PAIN MANAGEMENT Goal: RH STG PAIN MANAGED AT OR BELOW PT'S PAIN GOAL Description: <4 w/ prns Outcome: Progressing

## 2024-04-19 NOTE — Progress Notes (Addendum)
   IP Rehab Bowel Program Documentation  Day shift 9/5  X2 doses of sorbitol  given- once at 09:00 and another at 15:00  Bowel Program Start time 1900   Dig Stim Indicated? Yes  Dig Stim Prior to Suppository or mini Enema X 3 (pt refused soap suds enema)   Output from dig stim: Minimal   Ordered intervention: Suppository Yes, mini enema No (pt refused)    Repeat dig stim after Suppository or Mini enema  X 3,   Output? Minimal    Bowel Program Complete? No   Handoff given yes   Patient Tolerated? Yes    PM shift 9/5 Bowel Program-   19:30 repeat dig stim x1; minimal output;     20:00 repeat dig stim x1; minimal output;  04/20/24 04:00 - Pt had large BM.

## 2024-04-19 NOTE — Progress Notes (Signed)
 Occupational Therapy Weekly Progress Note  Patient Details  Name: Tyrone Mckinney MRN: [id number] Date of Birth: [birth year]  Beginning of progress report period: April 13, 2024 End of progress report period: April 19, 2024   Patient has met 0 of 3 short term goals.  Progress has been slow during the past week. Pt requires tot A/dependent for BADLs at bed level and sitting in TIS w/c. Bed mobility with max A/tot A+2. Sitting balance EOB/EOM with tot A. TB transfers with tot A+2. RUE biceps activation with gravity eliminated. RUE wristf/finger lextensors/extensors responded to Saebo but pt unable to replicate independently. Family conference on 9/4 with MD, CSW, nursing, and therapy.  Patient continues to demonstrate the following deficits: muscle weakness and muscle paralysis, decreased cardiorespiratoy endurance, impaired timing and sequencing, abnormal tone, unbalanced muscle activation, and decreased coordination, and decreased sitting balance, decreased postural control, and decreased balance strategies and therefore will continue to benefit from skilled OT intervention to enhance overall performance with BADL and Reduce care partner burden.  Patient progressing toward long term goals..  Continue plan of care.  OT Short Term Goals Week 1:  OT Short Term Goal 1 (Week 1): Pt will roll laterally in bed with Max A x1 to assist with ADL routine. OT Short Term Goal 1 - Progress (Week 1): Progressing toward goal OT Short Term Goal 2 (Week 1): Pt will bathe face with Mod A using compensatory techniques/strategies as needed. OT Short Term Goal 2 - Progress (Week 1): Progressing toward goal OT Short Term Goal 3 (Week 1): Pt will self-feed with Mod A using compensatory techniques/strategies as needed. OT Short Term Goal 3 - Progress (Week 1): Progressing toward goal Week 2:  OT Short Term Goal 1 (Week 2): Pt will roll laterally in bed with Max A x1 to assist with ADL routine. OT Short Term Goal  2 (Week 2): Pt will bathe face with Mod A using compensatory techniques/strategies as needed. OT Short Term Goal 3 (Week 2): Pt will self-feed with Mod A using compensatory techniques/strategies as needed.        Maritza Ned [facility] 04/19/2024, 9:52 AM

## 2024-04-19 NOTE — Plan of Care (Signed)
  Problem: Consults Goal: RH SPINAL CORD INJURY PATIENT EDUCATION Description:  See Patient Education module for education specifics.  Outcome: Progressing   Problem: SCI BLADDER ELIMINATION Goal: RH STG MANAGE BLADDER WITH ASSISTANCE Description: STG Manage Bladder With max Assistance Outcome: Progressing   Problem: RH SKIN INTEGRITY Goal: RH STG SKIN FREE OF INFECTION/BREAKDOWN Description: Manage skin free of infection with max assistance Outcome: Progressing   Problem: RH SAFETY Goal: RH STG ADHERE TO SAFETY PRECAUTIONS W/ASSISTANCE/DEVICE Description: STG Adhere to Safety Precautions With max  Assistance/Device. Outcome: Progressing   Problem: RH PAIN MANAGEMENT Goal: RH STG PAIN MANAGED AT OR BELOW PT'S PAIN GOAL Description: <4 w/ prns Outcome: Progressing   Problem: RH KNOWLEDGE DEFICIT SCI Goal: RH STG INCREASE KNOWLEDGE OF SELF CARE AFTER SCI Description: Manage increase knowledge of self care after SCI with max assistance from spouse/ family using educational materials  provided.  Outcome: Progressing

## 2024-04-19 NOTE — Progress Notes (Signed)
 Physical Therapy Session Note  Patient Details  Name: Tyrone Mckinney MRN: [id number] Date of Birth: [birth year]  Today's Date: 04/19/2024 PT Individual Time: 8694-8574 PT Individual Time Calculation (min): 80 min   Short Term Goals: Week 1:  PT Short Term Goal 1 (Week 1): Pt will roll to Left w/ total A consistently. PT Short Term Goal 2 (Week 1): Pt will tolerate OOB in w/c x 2 hours. PT Short Term Goal 3 (Week 1): Pt will tolerate sitting EOB x 5' w/ total A.  Skilled Therapeutic Interventions/Progress Updates: Pt presented in bed agreeable to therapy. Pt states having an off day, increased pain, spasms, and issues with BP this am. Pt noted to be incontinent of bladder. Pt performed rolling L/R total A for peri-care and brief change. PTA donned TED hose and threaded pants total A and performed rolling L/R in same manner to pull pants over hips and place maxi move sling. Completed dependent transfer to TIS vis Maxi move. Pt transferred to day room and completed dependent transfer to tilt table. Pt able to tolerate ~15 min on tilt table at 20-30 degrees, with BP noted below. Pt with no s/s of OH however BP initially dropped at 30 degrees, but able to tolerate second bout of 30 degrees without issues. Of note pt was able to compelte AA heel slide on LLE and trace quad activation on RLE! Pt returned to TIS and transported to [facility] entrance for environmental change with pt able to reproduce trace activation on RLE. Pt transported back to room and agreeable to remain in TIS at end of session with sip and puff in place and current needs met.  BP start of session: 98/66 (76) 68 BP on tilt table supine 114/77 (87) HR 64 BP 30 degrees 85/58 (67) HR 75 - asymptomatic BP at 20 degrees 97/70 (80) HR 69 HP when returned to 30 degrees 108/76 (85) HR 68     Therapy Documentation Precautions:  Precautions Precautions: Cervical, Fall Precaution/Restrictions Comments: MAP >65 Restrictions Weight Bearing  Restrictions Per Provider Order: Yes LUE Weight Bearing Per Provider Order: Non weight bearing Other Position/Activity Restrictions: Forearm based thumb spica LUE (per ortho); WHO RUE, B PRAFOs General:   Vital Signs: Therapy Vitals Temp: 98.4 F (36.9 C) Temp Source: Oral Pulse Rate: 67 Resp: 18 BP: 100/69 Patient Position (if appropriate): Lying Oxygen Therapy SpO2: 100 % O2 Device: Room Air Pain: Pain Assessment Pain Scale: 0-10 Pain Score: 8  Pain Location: Neck Pain Intervention(s): Medication (See eMAR) Therapy/Group: Individual Therapy  Ovie Eastep 04/19/2024, 4:20 PM

## 2024-04-19 NOTE — Progress Notes (Signed)
 Occupational Therapy Session Note  Patient Details  Name: Tyrone Mckinney MRN: [id number] Date of Birth: Apr 30, 1981  Today's Date: 04/19/2024 OT Individual Time: 0700-0810 OT Individual Time Calculation (min): 70 min    Short Term Goals: Week 1:  OT Short Term Goal 1 (Week 1): Pt will roll laterally in bed with Max A x1 to assist with ADL routine. OT Short Term Goal 2 (Week 1): Pt will bathe face with Mod A using compensatory techniques/strategies as needed. OT Short Term Goal 3 (Week 1): Pt will self-feed with Mod A using compensatory techniques/strategies as needed.  Skilled Therapeutic Interventions/Progress Updates:    Skilled OT intervention with focus on bed mobiity to facilitate LB dressing. BP 169/105 and HR 56 with not report of AD symptoms. Pt's brief wet. Tot A for rolling and hygiene. LPN inserted suppository in rectum. Ted Hose doffed (pt educated on importance of having staff remove ted hose at end of day.) MD attended to pt. BP remained elevated. Pt remained in bed. Pt educated on AD. Pt verbalized understanding. OTA will continue to review s/s of AD during future sessions. LPN attending to pt at end of session.   Therapy Documentation Precautions:  Precautions Precautions: Cervical, Fall Precaution/Restrictions Comments: MAP >65 Restrictions Weight Bearing Restrictions Per Provider Order: Yes LUE Weight Bearing Per Provider Order: Non weight bearing Other Position/Activity Restrictions: Forearm based thumb spica LUE (per ortho); WHO RUE, B PRAFOs Pain: Pt denied pain upon OTA arrival in room but reported increased BLE pain with BLE spasms; LPN aware and meds admin during therapy session   Therapy/Group: Individual Therapy  [name] 04/19/2024, 8:12 AM
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
## 2024-04-19 NOTE — Progress Notes (Signed)
 PROGRESS NOTE   Subjective/Complaints:  Pt reports doing well- didn't mention spasticity to me when I saw at 730- no issues except stayed up to 1am watching a game.  When went back to see pt, OT mentioned spasms pretty bad this AM-  Also BP 177/92 per Nurse and no HA- no nasal congestion or impending doom, but we removed TEDs and sat pt up- followed AD Protocol.  Did get bowel program last night- just no results.  Likely cause of BP higher Also spoke with NP- his BP yesterday 9/4 could have been due to being charged for DUI around the same time BP was elevated yesterday afternoon.  He was upset about this  ROS:  Per HPI   Pt denies SOB, abd pain, CP, N/V/C/D, and vision changes      Objective:   No results found.  Recent Labs    04/18/24 0551  WBC 6.1  HGB 10.0*  HCT 29.8*  PLT 496*   Recent Labs    04/18/24 0551  NA 140  K 4.2  CL 101  CO2 26  GLUCOSE 98  BUN 18  CREATININE 0.67  CALCIUM  9.2        Intake/Output Summary (Last 24 hours) at 04/19/2024 1414 Last data filed at 04/18/2024 2224 Gross per 24 hour  Intake 240 ml  Output --  Net 240 ml         Physical Exam: Vital Signs Blood pressure 103/68, pulse 63, temperature 98.8 F (37.1 C), temperature source Oral, resp. rate 18, height 6' 4 (1.93 m), weight 88.9 kg, SpO2 98%.         General: awake, alert, appropriate, sitting up in bed; brushing teeth with daughter and nurse in room; NAD HENT: conjugate gaze; oropharynx moist CV: regular rate and rhythm; no JVD Pulmonary: CTA B/L; no W/R/R- good air movement- decreased cough strength GI: soft, NT, appears slightly distended and hypoactive Psychiatric: appropriate- very bright Neurological: Ox3 Spasms appear more this AM than even yesterday- per therapy interfering in therapy  Ext: no clubbing, cyanosis, or edema, LUE wrist splint Psych: pleasant and cooperative Skin: dry, warm,  bruises   PRIOR EXAMS: Eyes:     Comments: Right scleral hemorrhage  Genitourinary:    Comments: Incontinent of urine, condom loose Neurological:     Mental Status: He is alert.     Comments: Alert and oriented x 3. Normal insight and awareness. Intact Memory. Normal language and speech. Cranial nerve exam unremarkable. MMT: RUE: delt 2+/5, biceps 3- to 3/5, triceps, wrist and HI all 0/5.  LUE: delt 1/5, biceps 2/5, triceps, wrist, HI all 0/5. RLE: 1/5 HE and HAD, 0/5 everywhere else. LLE: 2/5 HF, HAD and KE, ADF/PF 2+/5. Sensory exam diminished below C5 on right and ~C6 on left. 1/2 sensation in trunk and both LE, as well as inguinal/rectal area. Sensed sl more in LLE. DTR's 1+ in BUE and 3+ in BLE. Pt with significant extensor and hip adductor tone in both legs. 4-5 beats of clonus RLE and 2-3 in LLE.     Assessment/Plan: 1. Functional deficits which require 3+ hours per day of interdisciplinary therapy in a comprehensive inpatient rehab setting.  Physiatrist is providing close team supervision and 24 hour management of active medical problems listed below. Physiatrist and rehab team continue to assess barriers to discharge/monitor patient progress toward functional and medical goals  Care Tool:  Bathing        Body parts bathed by helper: Buttocks, Right upper leg, Left upper leg, Right lower leg, Left lower leg, Face, Right arm, Chest, Left arm, Abdomen, Front perineal area     Bathing assist Assist Level: Dependent - Patient 0%     Upper Body Dressing/Undressing Upper body dressing   What is the patient wearing?: Pull over shirt    Upper body assist Assist Level: Dependent - Patient 0%    Lower Body Dressing/Undressing Lower body dressing      What is the patient wearing?: Incontinence brief     Lower body assist Assist for lower body dressing: Dependent - Patient 0%     Toileting Toileting    Toileting assist Assist for toileting: Dependent - Patient 0%      Transfers Chair/bed transfer  Transfers assist     Chair/bed transfer assist level: Dependent - mechanical lift     Locomotion Ambulation   Ambulation assist   Ambulation activity did not occur: Safety/medical concerns          Walk 10 feet activity   Assist  Walk 10 feet activity did not occur: Safety/medical concerns        Walk 50 feet activity   Assist Walk 50 feet with 2 turns activity did not occur: Safety/medical concerns         Walk 150 feet activity   Assist Walk 150 feet activity did not occur: Safety/medical concerns         Walk 10 feet on uneven surface  activity   Assist Walk 10 feet on uneven surfaces activity did not occur: Safety/medical concerns         Wheelchair     Assist Is the patient using a wheelchair?: Yes Type of Wheelchair: Manual    Wheelchair assist level: Dependent - Patient 0% Max wheelchair distance: 150    Wheelchair 50 feet with 2 turns activity    Assist        Assist Level: Dependent - Patient 0%   Wheelchair 150 feet activity     Assist      Assist Level: Dependent - Patient 0%   Blood pressure 103/68, pulse 63, temperature 98.8 F (37.1 C), temperature source Oral, resp. rate 18, height 6' 4 (1.93 m), weight 88.9 kg, SpO2 98%.  Medical Problem List and Plan: 1. 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 Darnella of [facility] on 03/28/24:             -patient may not yet shower             -ELOS/Goals: 28-32 days, mod to max assist goals             -No collar needed per Dr. Darnella             -Bilateral PRAFO's, R WHO.  -I discussed with patient the basic approach  we will be taking with his rehab. He has had some  improvement in his ASIA score since he was seen by Dr. Dodi Leu  on 8/18. However, he will still likely require a power wheelchair as his primary means of mobility at discharge and will need significant assistance from family. He seems motivated and willing to work, however.  D/c 4 weeks Con't CIR PT and OT Family asking to be transferred to [facility] to be closer to wife- however without insurance we cannot transfer pt.  Team conference - went over C4 ASIA C, low chance of getting muscles back that doesn't have currently; Spasticity; Neurogenic Bowel and bladder, Risk of AD, OH; and LFT's and fracture of L wrist- also increased risk of DVT/PE Seen Neuropsych- they agree with me pt is in denial about SCI. About about illicit usage.  2.  Antithrombotics: -DVT/anticoagulation:  Pharmaceutical: Lovenox  30mg  BID  -04/14/24 DVT U/S pending 9/1- is (-) for DVT 9/4- needs Elqiuis or Lovenox  for a total of 3 months             -antiplatelet therapy: ASA 81mg  daily 3. Pain Management: Cymbalta  30mg  nightly, Gabapentin  600mg  TID, Tylenol  1000mg  q6h, Robaxin  1000mg  q8h, oxycodone  10-15mg  q4h prn,  -Consider increasing Cymbalta  to 60mg  nightly for improved pain control if needed -Pt developing early hypertonicity in LE's. Will begin scheduling tizanidine  at 2mg  tid to start. Can have 2mg  tid prn as well -04/13/24 decrease tylenol  650mg  QID d/t transminitis as below 9/1- changed Cymbalta  to qday since had nightmares last night- didn't take trazodone . 9/3- still having nightmares, but not med related- might be PTSD? Vs subconscious dealing with his injury? 4. Mood/Behavior/Sleep: ego support             -antipsychotic agents: n/a             -Psych consult inpatient, no needs at that time -Pt will need emotional support. Seems to be pretty upbeat. Family sounds supportive. Will have Dr. Corina see pt at some point during admit  9/4- D/w pt and family today that he's very 'bright- but I'm expecting him to hit a  wall when all this really hits.  5. Neuropsych/cognition: This patient is capable of making decisions on his own behalf. 6. Skin/Wound Care/large forehead laceration: staples removed 04/04/24. Routine skin care.  7. Fluids/Electrolytes/Nutrition: routine I&O, routine labs, continue vitamins/supplements (Ensure plus high protein BID, MVI)             -Regular diet 8. 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 Lester of [facility], continue ASA 81mg  daily 9. Superior endplate compression fx T5, T7, T8, T9, B/L upper thoracic posterior paraspinal muscle injury and edema, interspinous ligament injury T2-3 and possibly T3-4: no intervention needed. Per Dr. Darnella.  10. B/L apical pulmonary contusions, small pulmonary lac RLL: aggressive pulmonary toilet 11. L Ulnar styloid fx: NWB LUE, splint, f/up Dr. Murrell  9/4- when gets closer to 6 weeks, will call Dr Marjorie to go over when can WBAT 12. Orthostatic hypotension: continue Midodrine  20mg  TID, Strattera  25mg  daily also started per Dr. Jyl recs with goal perhaps of coming off midodrine . -continue TEDs and abdominal binder -04/14/24 BPs improving, monitor 9/1-9/2in general BP's soft, in 100s-110s. Supine, but doing better- con't regimen 9/3-9/4 BP's running low 90's to 110's- no concern about OH currently per therapy Vitals:   04/15/24 2002 04/16/24 0437 04/16/24 1532 04/17/24 1626  BP: 101/65 93/61 118/77 (!) 166/92   04/17/24 2030 04/18/24 0428 04/18/24 1130 04/18/24 1315  BP: 109/75 97/70 102/67 100/71   04/18/24 2012 04/19/24 0552 04/19/24 0800 04/19/24 0931  BP: 106/72 95/63 (!) 161/90  103/68    13. Neurogenic bowel: on  bowel program: Dulcolax suppository 10mg  daily, Miralax  BID, SenokotS 1 tab BID             -will adjust bowel program to 1800 daily             -senokot-s 2 tabs q am and miralax  BID -04/14/24 LBM yesterday and maybe overnight but not documented 9/1- had large BM, but pt reports cleaned up multiple times- but not  documented 9/2- 2 large Bms overnight with minimal results with bowel program itself 9/3- no results from bowel program- smear only x2- if goes another day, will need to be cleaned out 9/4- medium BM yesterday- smear with bowel program 9/5- no BM last night and none with another bowel program this AM in spite of being done-- will give Sorbitol  60cc now and then SSE this evening with bowel program- ordered 14. Neurogenic bladder: continue ISC q6h             -adjust I/O cath schedule to volumes of 300-500 cc -04/13/24 apparently used condom cath last night-- does feel urge to urinate, no bladder scans documented but pt says they were done but no ISC needed -Reviewed with nursing the plan for him, no condom cath for now, attempt with urinal q4-6h and bladder scan -ISC if scan >250cc -04/14/24 so far no ISC needed! Low PVRs!  9/1- pt with low PVRs yesterday however always incontinent- will monitor 9/2- pt insistent can hold it- but hasn't been continent- yet-  9/3- cannot void on command/ timed voiding- just incontinent- think he's overflowing, but cannot prove it.  9/4- still not voiding with toileting 9/5- I still think he's kicking off, but not emptying- he has no feeling of voiding, just leaking.  15. Polysubstance use disorder: UDS +cocaine, THC, Benzos-- provide counseling 16. 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 F/up outpatient.  17. ABLA: hgb stable 8-9 range, monitor twice weekly  -04/13/24 Hgb 9.6, monitor 18. Transaminitis: -04/13/24 Alk phos 286, AST 58, ALT 153-- could be from shock liver, monitor again on Monday. Decrease tylenol  to 650mg  QID. No abd pain/n/v but consider imaging/work up if those develop.  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  9/4 - pt had BP yesterday of 166/92- pt admits had HA- so had AD- educated pt and family on AD- will continue to educate staff, because we were not called on the probable AD episode yesterday- 166/92 was BP- isolated spike in BP = AD esp in pt that has OH- went over with PA and staff about how to treat- sit pt up, loosen tight clothing, then if doesn't improve, cath him; do bowel program- that's 80% of all AD causes- if not improved, let PA/Doctor know  9/5- Had BP of 177/92 this AM- likely AD, although no HA- nurse did great catch and tried to do bowel program- no results- will order another round of Sorbitol  60cc with SSE this evening to get him cleaned out- esp with constipation 20. Spasticity-   9/5- will increase Baclofen  to 10 mg TID_ and first dose at 630am- d/w nursing and therapy that spasticity is progressing- once  it starts, it's pretty fast initially- usually starts around 3-4 weeks and progresses, as I educated family about- and unfortunately we will try to keep up, but it's hard- he also has Zanaflex  2 mg TID AND 2 mg TID prn for additional spasms 21. 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 If so, will order SLP eval.   I spent a total of 56   minutes on total care today- >50% coordination of care- due to  Dealing with AD- saw pt 2x today- also issues arose after I left regarding spasticity- made changes to meds. Also reviewed vitals, bowels and bladder and orders- also spoke to entire team about spasticity, timing of meds and how pt's memory might not be the best- could he have a TBI due to his injury that we are missing??? D/w team as  well  LOS: 7 days A FACE TO FACE EVALUATION WAS PERFORMED  Texanna Hilburn 04/19/2024, 2:14 PM

## 2024-04-19 NOTE — Progress Notes (Signed)
 Physical Therapy Session Note  Patient Details  Name: Tyrone Mckinney MRN: [id number] Date of Birth: 04/10/1981  Today's Date: 04/19/2024 PT Individual Time: 1020-1100 PT Individual Time Calculation (min): 40 min   Short Term Goals: Week 1:  PT Short Term Goal 1 (Week 1): Pt will roll to Left w/ total A consistently. PT Short Term Goal 2 (Week 1): Pt will tolerate OOB in w/c x 2 hours. PT Short Term Goal 3 (Week 1): Pt will tolerate sitting EOB x 5' w/ total A.  Skilled Therapeutic Interventions/Progress Updates:    Pt recd in bed, reports not feeling well after potential AD and bowel program this am. Pt expressed desire to stay in bed at this time. Discussed pt's recent increase in neck pain, pt reports difficulty with positioning in bed. Provided manual therapy to cervical musculature for pain relief and education on muscle guarding as appropriate. Pt responded well and reported improved neck pain after session. Pt remained in bed and was left with all needs in reach and alarm active.   Therapy Documentation Precautions:  Precautions Precautions: Cervical, Fall Precaution/Restrictions Comments: MAP >65 Restrictions Weight Bearing Restrictions Per Provider Order: Yes LUE Weight Bearing Per Provider Order: Non weight bearing Other Position/Activity Restrictions: Forearm based thumb spica LUE (per ortho); WHO RUE, B PRAFOs General:       Therapy/Group: Individual Therapy  [name] 04/19/2024, 12:47 PM

## 2024-04-20 NOTE — Progress Notes (Signed)
 PROGRESS NOTE   Subjective/Complaints:  Pt doing well today, slept well, pain well managed, LBM around 3-4am, urinating ok using purewick and not needing caths. Incontinent though does have some sensation and urge to urinate but was unable to control it so went back to using purewick. No other complaints or concerns.   ROS:  Per HPI   Pt denies SOB, abd pain, CP, N/V/C/D, and vision changes      Objective:   No results found.  Recent Labs    04/18/24 0551  WBC 6.1  HGB 10.0*  HCT 29.8*  PLT 496*   Recent Labs    04/18/24 0551  NA 140  K 4.2  CL 101  CO2 26  GLUCOSE 98  BUN 18  CREATININE 0.67  CALCIUM  9.2        Intake/Output Summary (Last 24 hours) at 04/20/2024 1211 Last data filed at 04/20/2024 1100 Gross per 24 hour  Intake 320 ml  Output 775 ml  Net -455 ml         Physical Exam: Vital Signs Blood pressure 99/60, pulse 60, temperature 97.9 F (36.6 C), temperature source Oral, resp. rate 19, height 6' 4 (1.93 m), weight 88.9 kg, SpO2 99%.   General: awake, alert, appropriate, sitting up in bed; NAD HENT: conjugate gaze; oropharynx moist CV: regular rate and rhythm; no JVD Pulmonary: CTA B/L; no W/R/R- good air movement- decreased cough strength GI: soft, NT, ND, +BS throughout Psychiatric: appropriate- very bright Neurological: Ox3 Ext: no clubbing, cyanosis, or edema, LUE wrist splint Psych: pleasant and cooperative Skin: dry, warm, bruises   PRIOR EXAMS: Spasms appear more this AM than even yesterday- per therapy interfering in therapy  Eyes:     Comments: Right scleral hemorrhage  Genitourinary:    Comments: Incontinent of urine, condom loose Neurological:     Mental Status: He is alert.     Comments: Alert and oriented x 3. Normal insight and awareness. Intact Memory. Normal language and speech. Cranial nerve exam unremarkable. MMT: RUE: delt 2+/5, biceps 3- to 3/5,  triceps, wrist and HI all 0/5.  LUE: delt 1/5, biceps 2/5, triceps, wrist, HI all 0/5. RLE: 1/5 HE and HAD, 0/5 everywhere else. LLE: 2/5 HF, HAD and KE, ADF/PF 2+/5. Sensory exam diminished below C5 on right and ~C6 on left. 1/2 sensation in trunk and both LE, as well as inguinal/rectal area. Sensed sl more in LLE. DTR's 1+ in BUE and 3+ in BLE. Pt with significant extensor and hip adductor tone in both legs. 4-5 beats of clonus RLE and 2-3 in LLE.     Assessment/Plan: 1. Functional deficits which require 3+ hours per day of interdisciplinary therapy in a comprehensive inpatient rehab setting. Physiatrist is providing close team supervision and 24 hour management of active medical problems listed below. Physiatrist and rehab team continue to assess barriers to discharge/monitor patient progress toward functional and medical goals  Care Tool:  Bathing        Body parts bathed by helper: Buttocks, Right upper leg, Left upper leg, Right lower leg, Left lower leg, Face, Right arm, Chest, Left arm, Abdomen, Front perineal area     Bathing assist  Assist Level: Dependent - Patient 0%     Upper Body Dressing/Undressing Upper body dressing   What is the patient wearing?: Pull over shirt    Upper body assist Assist Level: Dependent - Patient 0%    Lower Body Dressing/Undressing Lower body dressing      What is the patient wearing?: Incontinence brief     Lower body assist Assist for lower body dressing: Dependent - Patient 0%     Toileting Toileting    Toileting assist Assist for toileting: Dependent - Patient 0%     Transfers Chair/bed transfer  Transfers assist     Chair/bed transfer assist level: Dependent - mechanical lift     Locomotion Ambulation   Ambulation assist   Ambulation activity did not occur: Safety/medical concerns          Walk 10 feet activity   Assist  Walk 10 feet activity did not occur: Safety/medical concerns        Walk 50 feet  activity   Assist Walk 50 feet with 2 turns activity did not occur: Safety/medical concerns         Walk 150 feet activity   Assist Walk 150 feet activity did not occur: Safety/medical concerns         Walk 10 feet on uneven surface  activity   Assist Walk 10 feet on uneven surfaces activity did not occur: Safety/medical concerns         Wheelchair     Assist Is the patient using a wheelchair?: Yes Type of Wheelchair: Manual    Wheelchair assist level: Dependent - Patient 0% Max wheelchair distance: 150    Wheelchair 50 feet with 2 turns activity    Assist        Assist Level: Dependent - Patient 0%   Wheelchair 150 feet activity     Assist      Assist Level: Dependent - Patient 0%   Blood pressure 99/60, pulse 60, temperature 97.9 F (36.6 C), temperature source Oral, resp. rate 19, height 6' 4 (1.93 m), weight 88.9 kg, SpO2 99%.  Medical Problem List and Plan: 1. 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 Darnella of [facility] on 03/28/24:             -patient may not yet shower             -ELOS/Goals: 28-32 days, mod to max assist goals             -No collar needed per Dr. Darnella             -Bilateral PRAFO's, R WHO.  -I discussed with patient the basic approach  we will be taking with his rehab. He has had some improvement in his ASIA score since he was seen by Dr. Cornelio on 8/18. However, he will still likely require a power wheelchair as his primary means of mobility at discharge and will need significant assistance from family. He seems motivated and willing to work, however.  D/c 4 weeks Con't CIR PT and OT Family asking to be transferred to [facility] to be closer to wife- however  without insurance we cannot transfer pt.  Team conference - went over C4 ASIA C, low chance of getting muscles back that doesn't  have currently; Spasticity; Neurogenic Bowel and bladder, Risk of AD, OH; and LFT's and fracture of L wrist- also increased risk of DVT/PE Seen Neuropsych- they agree with me pt is in denial about SCI. About about illicit usage.  2.  Antithrombotics: -DVT/anticoagulation:  Pharmaceutical: Lovenox  30mg  BID  -04/14/24 DVT U/S pending 9/1- is (-) for DVT 9/4- needs Elqiuis or Lovenox  for a total of 3 months             -antiplatelet therapy: ASA 81mg  daily 3. Pain Management: Cymbalta  30mg  nightly, Gabapentin  600mg  TID, Tylenol  1000mg  q6h, Robaxin  1000mg  q8h, oxycodone  10-15mg  q4h prn,  -Consider increasing Cymbalta  to 60mg  nightly for improved pain control if needed -Pt developing early hypertonicity in LE's. Will begin scheduling tizanidine  at 2mg  tid to start. Can have 2mg  tid prn as well -04/13/24 decrease tylenol  650mg  QID d/t transminitis as below 9/1- changed Cymbalta  to qday since had nightmares last night- didn't take trazodone . 9/3- still having nightmares, but not med related- might be PTSD? Vs subconscious dealing with his injury? 4. Mood/Behavior/Sleep: ego support             -antipsychotic agents: n/a             -Psych consult inpatient, no needs at that time -Pt will need emotional support. Seems to be pretty upbeat. Family sounds supportive. Will have Dr. Corina see pt at some point during admit  9/4- D/w pt and family today that he's very 'bright- but I'm expecting him to hit a wall when all this really hits.  5. Neuropsych/cognition: This patient is capable of making decisions on his own behalf. 6. Skin/Wound Care/large forehead laceration: staples removed 04/04/24. Routine skin care.  7. Fluids/Electrolytes/Nutrition: routine I&O, routine labs, continue vitamins/supplements (Ensure plus high protein BID, MVI)             -Regular diet 8. 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 Lester of [facility], continue ASA 81mg  daily 9. Superior endplate compression fx T5, T7, T8, T9, B/L upper thoracic posterior paraspinal muscle injury and edema, interspinous ligament injury T2-3 and possibly T3-4: no intervention needed. Per Dr. Darnella.  10. B/L apical pulmonary contusions, small pulmonary lac RLL: aggressive pulmonary toilet 11. L Ulnar styloid fx: NWB LUE, splint, f/up Dr. Murrell 9/4- when gets closer to 6 weeks, will call Dr Marjorie to go over when can WBAT 12. Orthostatic hypotension: continue Midodrine  20mg  TID, Strattera  25mg  daily also started per Dr. Jyl recs with goal perhaps of coming off midodrine . -continue TEDs and abdominal binder -04/14/24 BPs improving, monitor 9/1-9/2 in general BP's soft, in 100s-110s. Supine, but doing better- con't regimen 9/3-9/4 BP's running low 90's to 110's- no concern about OH currently per therapy -04/20/24 BP occasionally elevated briefly but improves, no AD sxs, monitor Vitals:   04/18/24 1130 04/18/24 1315 04/18/24 2012 04/19/24 0552  BP: 102/67 100/71 106/72 95/63   04/19/24 0800 04/19/24 0931 04/19/24 1435 04/19/24 1456  BP: (!) 161/90 103/68 104/70 100/69   04/19/24 1951 04/20/24 0335 04/20/24 0747 04/20/24 1040  BP: 112/69 99/64 (!) 140/83 99/60    13. Neurogenic bowel: on  bowel program: Dulcolax suppository 10mg  daily, Miralax  BID, SenokotS 1 tab BID             -will adjust bowel program to 1800 daily             -senokot-s 2 tabs q am and miralax  BID -04/14/24 LBM yesterday and maybe overnight but not  documented 9/1- had large BM, but pt reports cleaned up multiple times- but not documented 9/2- 2 large Bms overnight with minimal results with bowel program itself 9/3- no results from bowel program- smear only x2- if goes another day, will need to be cleaned out 9/4- medium BM yesterday- smear with bowel program 9/5- no BM last night and none with another bowel program this AM in  spite of being done-- will give Sorbitol  60cc now and then SSE this evening with bowel program- ordered -04/20/24 large BM last night-- monitor 14. Neurogenic bladder: continue ISC q6h             -adjust I/O cath schedule to volumes of 300-500 cc -04/13/24 apparently used condom cath last night-- does feel urge to urinate, no bladder scans documented but pt says they were done but no ISC needed -Reviewed with nursing the plan for him, no condom cath for now, attempt with urinal q4-6h and bladder scan -ISC if scan >250cc -04/14/24 so far no ISC needed! Low PVRs!  9/1- pt with low PVRs yesterday however always incontinent- will monitor 9/2- pt insistent can hold it- but hasn't been continent- yet-  9/3- cannot void on command/ timed voiding- just incontinent- think he's overflowing, but cannot prove it.  9/4- still not voiding with toileting 9/5- I still think he's kicking off, but not emptying- he has no feeling of voiding, just leaking-- same 9/6, no caths still, but incontinent 15. Polysubstance use disorder: UDS +cocaine, THC, Benzos-- provide counseling 16. 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 F/up outpatient.  17. ABLA: hgb stable 8-9 range, monitor twice weekly  -04/13/24 Hgb 9.6, monitor>> stable 9/4 at 10 18. Transaminitis: -04/13/24 Alk phos 286, AST 58, ALT 153-- could be from shock liver, monitor again on Monday. Decrease tylenol  to 650mg  QID. No abd pain/n/v but consider imaging/work up if those develop.  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 - pt had BP yesterday of 166/92- pt admits had HA- so had AD- educated pt and family on AD-  will continue to educate staff, because we were not called on the probable AD episode yesterday- 166/92 was BP- isolated spike in BP = AD esp in pt that has OH- went over with PA and staff about how to treat- sit pt up, loosen tight clothing, then if doesn't improve, cath him; do bowel program- that's 80% of all AD causes- if not improved, let PA/Doctor know 9/5- Had BP of 177/92 this AM- likely AD, although no HA- nurse did great catch and tried to do bowel program- no results- will order another round of Sorbitol  60cc with SSE this evening to get him cleaned out- esp with constipation -04/20/24 occasionally elevated BP but improves quickly, no AD symptoms, monitor  20. Spasticity-  9/5- will increase Baclofen  to 10 mg TID_ and first dose at 630am- d/w nursing and therapy that spasticity is progressing- once it starts, it's pretty fast initially- usually starts around 3-4 weeks and progresses, as I educated family about- and unfortunately we will try to keep up, but it's hard- he also has Zanaflex  2 mg TID AND 2 mg TID prn for additional  spasms 21. 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 If so, will order SLP eval.     LOS: 8 days A FACE TO FACE EVALUATION WAS PERFORMED  [address] 04/20/2024, 12:11 PM

## 2024-04-20 NOTE — Progress Notes (Shared)
..  IP Rehab Bowel Program Documentation   Bowel Program Start time [phone]  Dig Stim Indicated? Yes  Dig Stim Prior to Suppository or mini Enema X 5   Output from dig stim: Minimal  Ordered intervention: Suppository Yes , mini enema No ,   Repeat dig stim after Suppository or Mini enema  X {Numbers; 1-5:17750},  Output? {Desc; minimal/small/moderate/large/very large:110034}   Bowel Program Complete? {YES/NO:21197}, handoff given Rosaline, RN  Patient Tolerated? {YES/NO:21197}

## 2024-04-20 NOTE — Progress Notes (Signed)
      IP Rehab Bowel Program Documentation   Day shift 9/6   Bowel Program Start time 18:30   Dig Stim Indicated? Yes  Dig Stim Prior to Suppository or mini Enema X 3 (pt refused soap suds enema)   Output from dig stim: Minimal   Ordered intervention: Suppository Yes, mini enema No (pt refused)    Repeat dig stim after Suppository or Mini enema  X 3,   Output? Minimal    Bowel Program Complete? No    Handoff given yes   Patient Tolerated? Yes   Night shift 9/6  Dig stim x1 19:30   Output: x1 moderate size BM

## 2024-04-20 NOTE — Plan of Care (Signed)
  Problem: Consults Goal: RH SPINAL CORD INJURY PATIENT EDUCATION Description:  See Patient Education module for education specifics.  Outcome: Progressing   Problem: SCI BOWEL ELIMINATION Goal: RH STG MANAGE BOWEL WITH ASSISTANCE Description: STG Manage Bowel with max Assistance. Outcome: Progressing   Problem: SCI BLADDER ELIMINATION Goal: RH STG MANAGE BLADDER WITH ASSISTANCE Description: STG Manage Bladder With max Assistance Outcome: Progressing   Problem: RH SKIN INTEGRITY Goal: RH STG SKIN FREE OF INFECTION/BREAKDOWN Description: Manage skin free of infection with max assistance Outcome: Progressing   Problem: RH SAFETY Goal: RH STG ADHERE TO SAFETY PRECAUTIONS W/ASSISTANCE/DEVICE Description: STG Adhere to Safety Precautions With max  Assistance/Device. Outcome: Progressing   Problem: RH PAIN MANAGEMENT Goal: RH STG PAIN MANAGED AT OR BELOW PT'S PAIN GOAL Description: <4 w/ prns Outcome: Progressing   Problem: RH KNOWLEDGE DEFICIT SCI Goal: RH STG INCREASE KNOWLEDGE OF SELF CARE AFTER SCI Description: Manage increase knowledge of self care after SCI with max assistance from spouse/ family using educational materials  provided.  Outcome: Progressing

## 2024-04-21 NOTE — Plan of Care (Signed)
  Problem: Consults Goal: RH SPINAL CORD INJURY PATIENT EDUCATION Description:  See Patient Education module for education specifics.  04/21/2024 0440 by Neda Rosaline LABOR, RN Outcome: Progressing 04/21/2024 0038 by Neda Rosaline LABOR, RN Outcome: Progressing   Problem: SCI BOWEL ELIMINATION Goal: RH STG MANAGE BOWEL WITH ASSISTANCE Description: STG Manage Bowel with max Assistance. 04/21/2024 0440 by Neda Rosaline LABOR, RN Outcome: Progressing 04/21/2024 0038 by Neda Rosaline LABOR, RN Outcome: Progressing   Problem: SCI BLADDER ELIMINATION Goal: RH STG MANAGE BLADDER WITH ASSISTANCE Description: STG Manage Bladder With max Assistance 04/21/2024 0440 by Neda Rosaline LABOR, RN Outcome: Progressing 04/21/2024 0038 by Neda Rosaline LABOR, RN Outcome: Progressing   Problem: RH SKIN INTEGRITY Goal: RH STG SKIN FREE OF INFECTION/BREAKDOWN Description: Manage skin free of infection with max assistance 04/21/2024 0440 by Neda Rosaline LABOR, RN Outcome: Progressing 04/21/2024 0038 by Neda Rosaline LABOR, RN Outcome: Progressing   Problem: RH SAFETY Goal: RH STG ADHERE TO SAFETY PRECAUTIONS W/ASSISTANCE/DEVICE Description: STG Adhere to Safety Precautions With max  Assistance/Device. 04/21/2024 0440 by Neda Rosaline LABOR, RN Outcome: Progressing 04/21/2024 0038 by Neda Rosaline LABOR, RN Outcome: Progressing   Problem: RH PAIN MANAGEMENT Goal: RH STG PAIN MANAGED AT OR BELOW PT'S PAIN GOAL Description: <4 w/ prns 04/21/2024 0440 by Neda Rosaline LABOR, RN Outcome: Progressing 04/21/2024 0038 by Neda Rosaline LABOR, RN Outcome: Progressing   Problem: RH KNOWLEDGE DEFICIT SCI Goal: RH STG INCREASE KNOWLEDGE OF SELF CARE AFTER SCI Description: Manage increase knowledge of self care after SCI with max assistance from spouse/ family using educational materials  provided.  04/21/2024 0440 by Neda Rosaline LABOR, RN Outcome: Progressing 04/21/2024 0038 by Neda Rosaline LABOR, RN Outcome: Progressing

## 2024-04-21 NOTE — Progress Notes (Signed)
 IP Rehab Bowel Program Documentation   Bowel Program Start time 1800  Dig Stim Indicated? Yes  Dig Stim Prior to Suppository or mini Enema X 5   Output from dig stim:none Minimal  Ordered intervention: Suppository Yes , mini enema No ,   Repeat dig stim after Suppository or Mini enema  X ,  Output? Minimal   Bowel Program Complete? No , handoff given Neda RN  Patient Tolerated? Yes

## 2024-04-21 NOTE — Plan of Care (Signed)
 Post PM bowel program, pt had moderate size BM. 20:30 pt voided 225cc. PVR bladder scan revealed 80cc.   Problem: Consults Goal: RH SPINAL CORD INJURY PATIENT EDUCATION Description:  See Patient Education module for education specifics.  Outcome: Progressing   Problem: SCI BOWEL ELIMINATION Goal: RH STG MANAGE BOWEL WITH ASSISTANCE Description: STG Manage Bowel with max Assistance. Outcome: Progressing   Problem: SCI BLADDER ELIMINATION Goal: RH STG MANAGE BLADDER WITH ASSISTANCE Description: STG Manage Bladder With max Assistance Outcome: Progressing   Problem: RH SKIN INTEGRITY Goal: RH STG SKIN FREE OF INFECTION/BREAKDOWN Description: Manage skin free of infection with max assistance Outcome: Progressing   Problem: RH SAFETY Goal: RH STG ADHERE TO SAFETY PRECAUTIONS W/ASSISTANCE/DEVICE Description: STG Adhere to Safety Precautions With max  Assistance/Device. Outcome: Progressing   Problem: RH PAIN MANAGEMENT Goal: RH STG PAIN MANAGED AT OR BELOW PT'S PAIN GOAL Description: <4 w/ prns Outcome: Progressing   Problem: RH KNOWLEDGE DEFICIT SCI Goal: RH STG INCREASE KNOWLEDGE OF SELF CARE AFTER SCI Description: Manage increase knowledge of self care after SCI with max assistance from spouse/ family using educational materials  provided.  Outcome: Progressing

## 2024-04-21 NOTE — Progress Notes (Signed)
 Physical Therapy Session Note  Patient Details  Name: Tyrone Mckinney MRN: [id number] Date of Birth: 1981-01-22  Today's Date: 04/21/2024 PT Individual Time: 0920-1020 PT Individual Time Calculation (min): 60 min   Short Term Goals: Week 1:  PT Short Term Goal 1 (Week 1): Pt will roll to Left w/ total A consistently. PT Short Term Goal 2 (Week 1): Pt will tolerate OOB in w/c x 2 hours. PT Short Term Goal 3 (Week 1): Pt will tolerate sitting EOB x 5' w/ total A.  Skilled Therapeutic Interventions/Progress Updates:    pt received in bed and agreeable to therapy. Pt reports pain controlled at this time. Pt with purewick in place, therapist removed, found brief to be soiled from leaking. Therapist provided dependent brief change and pericare. Donned ted hose and pants tot a. Tot a x 2 for supine >sit. slideboard transfer with max a x 2, but noted increased ease of transfer vs previous attempts, pt with improved ability to participate and push with LLE! Pt participated in kinetron for neuromuscular return at 90cm/sec, noted incr difficulty vs last time attempting this activity, but still able to move pedal with LLE. Pt remained in TIS with instructions to ask for assistance with pressure relief. Sip and puff in place.   Therapy Documentation Precautions:  Precautions Precautions: Cervical, Fall Precaution/Restrictions Comments: MAP >65 Restrictions Weight Bearing Restrictions Per Provider Order: Yes LUE Weight Bearing Per Provider Order: Non weight bearing Other Position/Activity Restrictions: Forearm based thumb spica LUE (per ortho); WHO RUE, B PRAFOs General:      Therapy/Group: Individual Therapy  [name] 04/21/2024, 12:22 PM

## 2024-04-21 NOTE — Progress Notes (Signed)
 PROGRESS NOTE   Subjective/Complaints:  Pt doing well again today, slept well, denies pain currently, LBM last night, urinating fine and still not needing caths. Incontinent though. No other complaints or concerns.   ROS:  Per HPI   Pt denies SOB, abd pain, CP, N/V/C/D, and vision changes      Objective:   No results found.  No results for input(s): WBC, HGB, HCT, PLT in the last 72 hours.  No results for input(s): NA, K, CL, CO2, GLUCOSE, BUN, CREATININE, CALCIUM  in the last 72 hours.       Intake/Output Summary (Last 24 hours) at 04/21/2024 1108 Last data filed at 04/21/2024 0500 Gross per 24 hour  Intake 1140 ml  Output 1650 ml  Net -510 ml         Physical Exam: Vital Signs Blood pressure 104/66, pulse 65, temperature 98.7 F (37.1 C), temperature source Oral, resp. rate 19, height 6' 4 (1.93 m), weight 88.9 kg, SpO2 97%.   General: awake, alert, appropriate, sitting up in bed; NAD HENT: conjugate gaze; oropharynx moist CV: regular rate and rhythm; no JVD Pulmonary: CTA B/L; no W/R/R- good air movement- decreased cough strength GI: soft, NT, ND, +BS throughout Psychiatric: appropriate- very bright Neurological: Ox3 Ext: no clubbing, cyanosis, or edema, LUE wrist splint Psych: pleasant and cooperative Skin: dry, warm, bruises   PRIOR EXAMS: Spasms appear more this AM than even yesterday- per therapy interfering in therapy  Eyes:     Comments: Right scleral hemorrhage  Genitourinary:    Comments: Incontinent of urine, condom loose Neurological:     Mental Status: He is alert.     Comments: Alert and oriented x 3. Normal insight and awareness. Intact Memory. Normal language and speech. Cranial nerve exam unremarkable. MMT: RUE: delt 2+/5, biceps 3- to 3/5, triceps, wrist and HI all 0/5.  LUE: delt 1/5, biceps 2/5, triceps, wrist, HI all 0/5. RLE: 1/5 HE and HAD, 0/5  everywhere else. LLE: 2/5 HF, HAD and KE, ADF/PF 2+/5. Sensory exam diminished below C5 on right and ~C6 on left. 1/2 sensation in trunk and both LE, as well as inguinal/rectal area. Sensed sl more in LLE. DTR's 1+ in BUE and 3+ in BLE. Pt with significant extensor and hip adductor tone in both legs. 4-5 beats of clonus RLE and 2-3 in LLE.     Assessment/Plan: 1. Functional deficits which require 3+ hours per day of interdisciplinary therapy in a comprehensive inpatient rehab setting. Physiatrist is providing close team supervision and 24 hour management of active medical problems listed below. Physiatrist and rehab team continue to assess barriers to discharge/monitor patient progress toward functional and medical goals  Care Tool:  Bathing        Body parts bathed by helper: Buttocks, Right upper leg, Left upper leg, Right lower leg, Left lower leg, Face, Right arm, Chest, Left arm, Abdomen, Front perineal area     Bathing assist Assist Level: Dependent - Patient 0%     Upper Body Dressing/Undressing Upper body dressing   What is the patient wearing?: Pull over shirt    Upper body assist Assist Level: Dependent - Patient 0%    Lower  Body Dressing/Undressing Lower body dressing      What is the patient wearing?: Incontinence brief     Lower body assist Assist for lower body dressing: Dependent - Patient 0%     Toileting Toileting    Toileting assist Assist for toileting: Dependent - Patient 0%     Transfers Chair/bed transfer  Transfers assist     Chair/bed transfer assist level: Dependent - mechanical lift     Locomotion Ambulation   Ambulation assist   Ambulation activity did not occur: Safety/medical concerns          Walk 10 feet activity   Assist  Walk 10 feet activity did not occur: Safety/medical concerns        Walk 50 feet activity   Assist Walk 50 feet with 2 turns activity did not occur: Safety/medical concerns         Walk  150 feet activity   Assist Walk 150 feet activity did not occur: Safety/medical concerns         Walk 10 feet on uneven surface  activity   Assist Walk 10 feet on uneven surfaces activity did not occur: Safety/medical concerns         Wheelchair     Assist Is the patient using a wheelchair?: Yes Type of Wheelchair: Manual    Wheelchair assist level: Dependent - Patient 0% Max wheelchair distance: 150    Wheelchair 50 feet with 2 turns activity    Assist        Assist Level: Dependent - Patient 0%   Wheelchair 150 feet activity     Assist      Assist Level: Dependent - Patient 0%   Blood pressure 104/66, pulse 65, temperature 98.7 F (37.1 C), temperature source Oral, resp. rate 19, height 6' 4 (1.93 m), weight 88.9 kg, SpO2 97%.  Medical Problem List and Plan: 1. 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 Darnella of [facility] on 03/28/24:             -patient may not yet shower             -ELOS/Goals: 28-32 days, mod to max assist goals             -No collar needed per Dr. Darnella             -Bilateral PRAFO's, R WHO.  -I discussed with patient the basic approach  we will be taking with his rehab. He has had some improvement in his ASIA score since he was seen by Dr. Cornelio on 8/18. However, he will still likely require a power wheelchair as his primary means of mobility at discharge and will need significant assistance from family. He seems motivated and willing to work, however.  D/c 4 weeks Con't CIR PT and OT Family asking to be transferred to [facility] to be closer to wife- however without insurance we cannot transfer pt.  Team conference - went over C4 ASIA C, low chance of getting muscles  back that doesn't have currently; Spasticity; Neurogenic Bowel and bladder, Risk of AD, OH; and LFT's and fracture of L wrist- also increased risk of DVT/PE Seen Neuropsych- they agree with me pt is in denial about SCI. About about illicit usage.  2.  Antithrombotics: -DVT/anticoagulation:  Pharmaceutical: Lovenox  30mg  BID 9/1- is (-) for DVT 9/4- needs Elqiuis or Lovenox  for a total of 3 months             -antiplatelet therapy: ASA 81mg  daily 3. Pain Management: Cymbalta  30mg  nightly, Gabapentin  600mg  TID, Tylenol  1000mg  q6h, Robaxin  1000mg  q8h, oxycodone  10-15mg  q4h prn,  -Consider increasing Cymbalta  to 60mg  nightly for improved pain control if needed -Pt developing early hypertonicity in LE's. Will begin scheduling tizanidine  at 2mg  tid to start. Can have 2mg  tid prn as well -04/13/24 decrease tylenol  650mg  QID d/t transminitis as below 9/1- changed Cymbalta  to qday since had nightmares last night- didn't take trazodone . 9/3- still having nightmares, but not med related- might be PTSD? Vs subconscious dealing with his injury? 4. Mood/Behavior/Sleep: ego support             -antipsychotic agents: n/a             -Psych consult inpatient, no needs at that time -Pt will need emotional support. Seems to be pretty upbeat. Family sounds supportive. Will have Dr. Corina see pt at some point during admit  9/4- D/w pt and family today that he's very 'bright- but I'm expecting him to hit a wall when all this really hits.  5. Neuropsych/cognition: This patient is capable of making decisions on his own behalf. 6. Skin/Wound Care/large forehead laceration: staples removed 04/04/24. Routine skin care.  7. Fluids/Electrolytes/Nutrition: routine I&O, routine labs, continue vitamins/supplements (Ensure plus high protein BID, MVI)             -Regular diet 8. 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 Lester of [facility], continue ASA 81mg  daily 9. Superior endplate compression fx T5, T7, T8, T9,  B/L upper thoracic posterior paraspinal muscle injury and edema, interspinous ligament injury T2-3 and possibly T3-4: no intervention needed. Per Dr. Darnella.  10. B/L apical pulmonary contusions, small pulmonary lac RLL: aggressive pulmonary toilet 11. L Ulnar styloid fx: NWB LUE, splint, f/up Dr. Murrell 9/4- when gets closer to 6 weeks, will call Dr Marjorie to go over when can WBAT 12. Orthostatic hypotension: continue Midodrine  20mg  TID, Strattera  25mg  daily also started per Dr. Jyl recs with goal perhaps of coming off midodrine . -continue TEDs and abdominal binder -04/14/24 BPs improving, monitor 9/1-9/2 in general BP's soft, in 100s-110s. Supine, but doing better- con't regimen 9/3-9/4 BP's running low 90's to 110's- no concern about OH currently per therapy -9/6-7/25 BP occasionally elevated briefly but improves, no AD sxs, monitor Vitals:   04/19/24 0552 04/19/24 0800 04/19/24 0931 04/19/24 1435  BP: 95/63 (!) 161/90 103/68 104/70   04/19/24 1456 04/19/24 1951 04/20/24 0335 04/20/24 0747  BP: 100/69 112/69 99/64 (!) 140/83   04/20/24 1040 04/20/24 1359 04/20/24 1904 04/21/24 0306  BP: 99/60 118/75 100/66 104/66    13. Neurogenic bowel: on  bowel program: Dulcolax suppository 10mg  daily, Miralax  BID, SenokotS 1 tab BID             -will adjust bowel program to 1800 daily             -senokot-s 2 tabs q am and miralax  BID -04/14/24 LBM yesterday and maybe overnight but not documented 9/1- had large BM, but pt reports cleaned up multiple times- but not documented 9/2- 2 large Bms overnight with minimal results with bowel program itself 9/3- no results from bowel program- smear only x2- if goes another day, will need to be cleaned out 9/4-  medium BM yesterday- smear with bowel program 9/5- no BM last night and none with another bowel program this AM in spite of being done-- will give Sorbitol  60cc now and then SSE this evening with bowel program- ordered -04/21/24 small BM last night--  monitor 14. Neurogenic bladder: continue ISC q6h             -adjust I/O cath schedule to volumes of 300-500 cc -04/13/24 apparently used condom cath last night-- does feel urge to urinate, no bladder scans documented but pt says they were done but no ISC needed -Reviewed with nursing the plan for him, no condom cath for now, attempt with urinal q4-6h and bladder scan -ISC if scan >250cc -04/14/24 so far no ISC needed! Low PVRs!  9/1- pt with low PVRs yesterday however always incontinent- will monitor 9/2- pt insistent can hold it- but hasn't been continent- yet-  9/3- cannot void on command/ timed voiding- just incontinent- think he's overflowing, but cannot prove it.  9/4- still not voiding with toileting 9/5- I still think he's kicking off, but not emptying- he has no feeling of voiding, just leaking-- same 9/6-7, no caths still, but incontinent 15. Polysubstance use disorder: UDS +cocaine, THC, Benzos-- provide counseling 16. 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 F/up outpatient.  17. ABLA: hgb stable 8-9 range, monitor twice weekly  -04/13/24 Hgb 9.6, monitor>> stable 9/4 at 10 18. Transaminitis: -04/13/24 Alk phos 286, AST 58, ALT 153-- could be from shock liver, monitor again on Monday. Decrease tylenol  to 650mg  QID. No abd pain/n/v but consider imaging/work up if those develop.  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 - pt had BP yesterday of 166/92- pt admits had HA- so had AD- educated pt and family on AD- will continue to educate staff, because we were not called on the probable AD episode yesterday- 166/92 was BP- isolated spike in BP  = AD esp in pt that has OH- went over with PA and staff about how to treat- sit pt up, loosen tight clothing, then if doesn't improve, cath him; do bowel program- that's 80% of all AD causes- if not improved, let PA/Doctor know 9/5- Had BP of 177/92 this AM- likely AD, although no HA- nurse did great catch and tried to do bowel program- no results- will order another round of Sorbitol  60cc with SSE this evening to get him cleaned out- esp with constipation -04/20/24 occasionally elevated BP but improves quickly, no AD symptoms, monitor  20. Spasticity-  9/5- will increase Baclofen  to 10 mg TID_ and first dose at 630am- d/w nursing and therapy that spasticity is progressing- once it starts, it's pretty fast initially- usually starts around 3-4 weeks and progresses, as I educated family about- and unfortunately we will try to keep up, but it's hard- he also has Zanaflex  2 mg TID AND 2 mg TID prn for additional spasms 21. 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 If so, will order SLP eval.     LOS: 9 days A FACE TO FACE EVALUATION WAS PERFORMED  [address] 04/21/2024, 11:08 AM

## 2024-04-22 LAB — CBC
HCT: 31.1 % — ABNORMAL LOW (ref 39.0–52.0)
Hemoglobin: 10.3 g/dL — ABNORMAL LOW (ref 13.0–17.0)
MCH: 30.4 pg (ref 26.0–34.0)
MCHC: 33.1 g/dL (ref 30.0–36.0)
MCV: 91.7 fL (ref 80.0–100.0)
Platelets: 403 K/uL — ABNORMAL HIGH (ref 150–400)
RBC: 3.39 MIL/uL — ABNORMAL LOW (ref 4.22–5.81)
RDW: 12.3 % (ref 11.5–15.5)
WBC: 6.9 K/uL (ref 4.0–10.5)
nRBC: 0 % (ref 0.0–0.2)

## 2024-04-22 LAB — COMPREHENSIVE METABOLIC PANEL WITH GFR
ALT: 110 U/L — ABNORMAL HIGH (ref 0–44)
AST: 48 U/L — ABNORMAL HIGH (ref 15–41)
Albumin: 2.8 g/dL — ABNORMAL LOW (ref 3.5–5.0)
Alkaline Phosphatase: 213 U/L — ABNORMAL HIGH (ref 38–126)
Anion gap: 15 (ref 5–15)
BUN: 17 mg/dL (ref 6–20)
CO2: 25 mmol/L (ref 22–32)
Calcium: 9.2 mg/dL (ref 8.9–10.3)
Chloride: 99 mmol/L (ref 98–111)
Creatinine, Ser: 0.77 mg/dL (ref 0.61–1.24)
GFR, Estimated: >60 mL/min (ref >60)
Glucose, Bld: 110 mg/dL — ABNORMAL HIGH (ref 70–99)
Potassium: 4.5 mmol/L (ref 3.5–5.1)
Sodium: 139 mmol/L (ref 135–145)
Total Bilirubin: 0.4 mg/dL (ref 0.0–1.2)
Total Protein: 7 g/dL (ref 6.5–8.1)

## 2024-04-22 MED ORDER — CEPHALEXIN 250 MG PO CAPS
500.0000 mg | ORAL_CAPSULE | Freq: Two times a day (BID) | ORAL | Status: AC
  Administered 2024-04-22 – 2024-04-29 (×14): 500 mg via ORAL
  Filled 2024-04-22 (×14): qty 2

## 2024-04-22 NOTE — Progress Notes (Signed)
 Physical Therapy Session Note  Patient Details  Name: Tyrone Mckinney MRN: [id number] Date of Birth: August 07, 1981  Today's Date: 04/22/2024 PT Individual Time: 9099-9054 PT Individual Time Calculation (min): 45 min   Short Term Goals: Week 1:  PT Short Term Goal 1 (Week 1): Pt will roll to Left w/ total A consistently. PT Short Term Goal 2 (Week 1): Pt will tolerate OOB in w/c x 2 hours. PT Short Term Goal 3 (Week 1): Pt will tolerate sitting EOB x 5' w/ total A.   Skilled Therapeutic Interventions/Progress Updates:    Pt presents in bed and agreeable to therapy session. States increased pain in R shoulder and neck area today (increased drainage from incision noted yellow color and RN aware and notifed - applied dressing due to draining with increased movement). Pt reporting need to urinate (felt like he already did, only a small amount and once the brief opened, urinated more). Total +2 for rolling to R and L several times for clothing management, changing brief, and pulling up pants. Total for hygiene. Total +2 for coming to EOB with facilitation for trunk and balance. During slideboard transfer focused on head/hips relationship, activation through LLE and trunk control with +2 assist. Seated in TIS, set up at sink and focused on oral care per pt request with hand over hand facilitation due to decreased strength in UE and decreased grip - educated on various ways with built ups, bracing etc that we can work on pt increasing his independence with this task - pt was very excited to be able to work on brushing his teeth himself. Max A for anterior weightshift forwards x 2 for spitting - total A for cup management for swishing. Set up in TIS semi reclined position and sip and puff set up.  Therapy Documentation Precautions:  Precautions Precautions: Cervical, Fall Precaution/Restrictions Comments: MAP >65 Restrictions Weight Bearing Restrictions Per Provider Order: Yes LUE Weight Bearing Per  Provider Order: Non weight bearing Other Position/Activity Restrictions: Forearm based thumb spica LUE (per ortho); WHO RUE, B PRAFOs    Pain: Pain Assessment Pain Scale: 0-10 Pain Score: 7  Pain Location: Neck Repositioned as need and RN aware - increased drainage noted at incision site.    Therapy/Group: Individual Therapy  Elnor [employer] Sherrell Pizza WENDI Elnor, PT, DPT, CBIS  04/22/2024, 9:49 AM

## 2024-04-22 NOTE — Progress Notes (Signed)
 PROGRESS NOTE   Subjective/Complaints:  Pt reports doing well- ate a lot  last night for dinner- but no results from bowel program  LBM documented ? 9/7 in AM- but wasn't clear type or size, so not positive had BM and small one documented 9/6-   Also notes got stuck in the corner of the bed-last night-  and was stuck there all night- per pt, nurses told him they had a bad back and couldn't turn him.   Also asked to come back to see pt to look at incision in back of neck- concerned that has dehisced.   No spasms this AM first time since last week  ROS:  Per HPI   Pt denies SOB, abd pain, CP, N/V/C/D, and vision changes      Objective:   No results found.  Recent Labs    04/22/24 0534  WBC 6.9  HGB 10.3*  HCT 31.1*  PLT 403*    Recent Labs    04/22/24 0534  NA 139  K 4.5  CL 99  CO2 25  GLUCOSE 110*  BUN 17  CREATININE 0.77  CALCIUM  9.2         Intake/Output Summary (Last 24 hours) at 04/22/2024 1027 Last data filed at 04/22/2024 0600 Gross per 24 hour  Intake 640 ml  Output 670 ml  Net -30 ml         Physical Exam: Vital Signs Blood pressure 104/64, pulse 63, temperature 98 F (36.7 C), resp. rate 17, height 6' 4 (1.93 m), weight 88.9 kg, SpO2 97%.   General: awake, alert, appropriate, sitting up with therapy first time and then seen on side with OT 2nd time; NAD HENT: conjugate gaze; oropharynx moist CV: regular rate and rhythm; no JVD Pulmonary: CTA B/L; no W/R/R- good air movement GI: soft, NT, slightly distended, (+)BS- hypoactive-  Psychiatric: appropriate- very bright  Neurological: Ox3 Skin: Bottom 1/4 of incision of neck- is swollen/puffy- and draining onto pillow serous drainage- appears very moist Ext: no clubbing, cyanosis, or edema, LUE wrist splint   PRIOR EXAMS: Spasms appear more this AM than even yesterday- per therapy interfering in therapy  Eyes:     Comments:  Right scleral hemorrhage  Genitourinary:    Comments: Incontinent of urine, condom loose Neurological:     Mental Status: He is alert.     Comments: Alert and oriented x 3. Normal insight and awareness. Intact Memory. Normal language and speech. Cranial nerve exam unremarkable. MMT: RUE: delt 2+/5, biceps 3- to 3/5, triceps, wrist and HI all 0/5.  LUE: delt 1/5, biceps 2/5, triceps, wrist, HI all 0/5. RLE: 1/5 HE and HAD, 0/5 everywhere else. LLE: 2/5 HF, HAD and KE, ADF/PF 2+/5. Sensory exam diminished below C5 on right and ~C6 on left. 1/2 sensation in trunk and both LE, as well as inguinal/rectal area. Sensed sl more in LLE. DTR's 1+ in BUE and 3+ in BLE. Pt with significant extensor and hip adductor tone in both legs. 4-5 beats of clonus RLE and 2-3 in LLE.     Assessment/Plan: 1. Functional deficits which require 3+ hours per day of interdisciplinary therapy in a comprehensive inpatient rehab  setting. Physiatrist is providing close team supervision and 24 hour management of active medical problems listed below. Physiatrist and rehab team continue to assess barriers to discharge/monitor patient progress toward functional and medical goals  Care Tool:  Bathing        Body parts bathed by helper: Buttocks, Right upper leg, Left upper leg, Right lower leg, Left lower leg, Face, Right arm, Chest, Left arm, Abdomen, Front perineal area     Bathing assist Assist Level: Dependent - Patient 0%     Upper Body Dressing/Undressing Upper body dressing   What is the patient wearing?: Pull over shirt    Upper body assist Assist Level: Dependent - Patient 0%    Lower Body Dressing/Undressing Lower body dressing      What is the patient wearing?: Incontinence brief     Lower body assist Assist for lower body dressing: Dependent - Patient 0%     Toileting Toileting    Toileting assist Assist for toileting: Dependent - Patient 0%     Transfers Chair/bed transfer  Transfers  assist     Chair/bed transfer assist level: 2 Helpers     Locomotion Ambulation   Ambulation assist   Ambulation activity did not occur: Safety/medical concerns          Walk 10 feet activity   Assist  Walk 10 feet activity did not occur: Safety/medical concerns        Walk 50 feet activity   Assist Walk 50 feet with 2 turns activity did not occur: Safety/medical concerns         Walk 150 feet activity   Assist Walk 150 feet activity did not occur: Safety/medical concerns         Walk 10 feet on uneven surface  activity   Assist Walk 10 feet on uneven surfaces activity did not occur: Safety/medical concerns         Wheelchair     Assist Is the patient using a wheelchair?: Yes Type of Wheelchair: Manual    Wheelchair assist level: Dependent - Patient 0% Max wheelchair distance: 150    Wheelchair 50 feet with 2 turns activity    Assist        Assist Level: Dependent - Patient 0%   Wheelchair 150 feet activity     Assist      Assist Level: Dependent - Patient 0%   Blood pressure 104/64, pulse 63, temperature 98 F (36.7 C), resp. rate 17, height 6' 4 (1.93 m), weight 88.9 kg, SpO2 97%.  Medical Problem List and Plan: 1. 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 Darnella of [facility] on 03/28/24:             -patient may not yet shower             -ELOS/Goals: 28-32 days, mod to max assist goals             -No collar needed per Dr. Darnella             -Bilateral PRAFO's, R WHO.  -I discussed with patient the basic approach  we will be taking with his rehab. He has had some improvement in his ASIA score since he was seen by Dr. Cornelio on 8/18. However, he will  still  likely require a power wheelchair as his primary means of mobility at discharge and will need significant assistance from family. He seems motivated and willing to work, however.  D/c 4 weeks Con't CIR PT and OT Family asking to be transferred to [facility] to be closer to wife- however without insurance we cannot transfer pt.  Team conference - went over C4 ASIA C, low chance of getting muscles back that doesn't have currently; Spasticity; Neurogenic Bowel and bladder, Risk of AD, OH; and LFT's and fracture of L wrist- also increased risk of DVT/PE 9/8- asked Dr Darnella to come see pt's incision- it's draining pretty far out from surgery and is puffy, open 2.  Antithrombotics: -DVT/anticoagulation:  Pharmaceutical: Lovenox  30mg  BID 9/1- is (-) for DVT 9/4- needs Elqiuis or Lovenox  for a total of 3 months             -antiplatelet therapy: ASA 81mg  daily 3. Pain Management: Cymbalta  30mg  nightly, Gabapentin  600mg  TID, Tylenol  1000mg  q6h, Robaxin  1000mg  q8h, oxycodone  10-15mg  q4h prn,  -Consider increasing Cymbalta  to 60mg  nightly for improved pain control if needed -Pt developing early hypertonicity in LE's. Will begin scheduling tizanidine  at 2mg  tid to start. Can have 2mg  tid prn as well -04/13/24 decrease tylenol  650mg  QID d/t transminitis as below 9/1- changed Cymbalta  to qday since had nightmares last night- didn't take trazodone . 9/3- still having nightmares, but not med related- might be PTSD? Vs subconscious dealing with his injury? 4. Mood/Behavior/Sleep: ego support             -antipsychotic agents: n/a             -Psych consult inpatient, no needs at that time -Pt will need emotional support. Seems to be pretty upbeat. Family sounds supportive. Will have Dr. Corina see pt at some point during admit  9/4- D/w pt and family today that he's very 'bright- but I'm expecting him to hit a wall when all this really hits.  5. Neuropsych/cognition: This patient is capable of making decisions on  his own behalf. 6. Skin/Wound Care/large forehead laceration: staples removed 04/04/24.  Routine skin care.  9/8- incision on posterior neck puffy, draining, called Dr Darnella via NP 7. Fluids/Electrolytes/Nutrition: routine I&O, routine labs, continue vitamins/supplements (Ensure plus high protein BID, MVI)             -Regular diet 8. 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 Lester of [facility], continue ASA 81mg  daily 9. Superior endplate compression fx T5, T7, T8, T9, B/L upper thoracic posterior paraspinal muscle injury and edema, interspinous ligament injury T2-3 and possibly T3-4: no intervention needed. Per Dr. Darnella.  10. B/L apical pulmonary contusions, small pulmonary lac RLL: aggressive pulmonary toilet 11. L Ulnar styloid fx: NWB LUE, splint, f/up Dr. Murrell 9/4- when gets closer to 6 weeks, will call Dr Marjorie to go over when can WBAT 12. Orthostatic hypotension: continue Midodrine  20mg  TID, Strattera  25mg  daily also started per Dr. Jyl recs with goal perhaps of coming off midodrine . -continue TEDs and abdominal binder -04/14/24 BPs improving, monitor 9/1-9/2 in general BP's soft, in 100s-110s. Supine, but doing better- con't regimen 9/3-9/4 BP's running low 90's to 110's- no concern about OH currently per therapy -9/6-7/25 BP occasionally elevated briefly but improves, no AD sxs, monitor 9/8- BP 131 systolic this AM! Sitting upright Vitals:   04/19/24 1435 04/19/24 1456 04/19/24 1951 04/20/24 0335  BP: 104/70 100/69 112/69 99/64   04/20/24 0747 04/20/24 1040 04/20/24  1359 04/20/24 1904  BP: (!) 140/83 99/60 118/75 100/66   04/21/24 0306 04/21/24 1702 04/21/24 2111 04/22/24 0455  BP: 104/66 112/76 119/86 104/64    13. Neurogenic bowel: on  bowel program: Dulcolax suppository 10mg  daily, Miralax  BID, SenokotS 1 tab BID             -will adjust bowel program to 1800 daily             -senokot-s 2 tabs q am and miralax  BID -04/14/24 LBM yesterday and maybe overnight but  not documented 9/1- had large BM, but pt reports cleaned up multiple times- but not documented 9/2- 2 large Bms overnight with minimal results with bowel program itself 9/3- no results from bowel program- smear only x2- if goes another day, will need to be cleaned out 9/4- medium BM yesterday- smear with bowel program 9/5- no BM last night and none with another bowel program this AM in spite of being done-- will give Sorbitol  60cc now and then SSE this evening with bowel program- ordered -04/21/24 small BM last night-- monitor 14. Neurogenic bladder: continue ISC q6h             -adjust I/O cath schedule to volumes of 300-500 cc -04/13/24 apparently used condom cath last night-- does feel urge to urinate, no bladder scans documented but pt says they were done but no ISC needed -Reviewed with nursing the plan for him, no condom cath for now, attempt with urinal q4-6h and bladder scan -ISC if scan >250cc 9/4- still not voiding with toileting 9/5- I still think he's kicking off, but not emptying- he has no feeling of voiding, just leaking-- same 9/6-7, no caths still, but incontinent 9/8- No results last night- maybe had BM yesterday AM, Bowel program note said moderate stool- and small one day prior- if no results tonight, will need sorbitol - also advised even if has bm that day, needs bowel program 15. Polysubstance use disorder: UDS +cocaine, THC, Benzos-- provide counseling 16. 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 F/up outpatient.  17. ABLA: hgb stable 8-9 range, monitor twice weekly  -04/13/24 Hgb 9.6, monitor>> stable 9/4 at 10 18. Transaminitis: -04/13/24 Alk phos 286, AST 58, ALT 153-- could be from shock liver, monitor again on Monday. Decrease tylenol  to 650mg  QID. No abd pain/n/v but consider imaging/work up if those  develop.  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 - pt had BP yesterday of 166/92- pt admits had HA- so had AD- educated pt and family on AD- will continue to educate staff, because we were not called on the probable AD episode yesterday- 166/92 was BP- isolated spike in BP = AD esp in pt that has OH- went over with PA and staff about how to treat- sit pt up, loosen tight clothing, then if doesn't improve, cath him; do bowel program- that's 80% of all AD causes- if not improved, let PA/Doctor know 9/5- Had BP of 177/92 this AM- likely AD, although no HA- nurse did great catch and tried  to do bowel program- no results- will order another round of Sorbitol  60cc with SSE this evening to get him cleaned out- esp with constipation -04/20/24 occasionally elevated BP but improves quickly, no AD symptoms, monitor  20. Spasticity-  9/5- will increase Baclofen  to 10 mg TID_ and first dose at 630am- d/w nursing and therapy that spasticity is progressing- once it starts, it's pretty fast initially- usually starts around 3-4 weeks and progresses, as I educated family about- and unfortunately we will try to keep up, but it's hard- he also has Zanaflex  2 mg TID AND 2 mg TID prn for additional spasms 9/8- has improved spasticity-  21. 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 If so, will order SLP eval.   I spent a total of  58  minutes on total care today- >50% coordination of care- due  to  [facility] back to see pt 2 different times today- also spoke with NP- [facility] coming to see pt- about swelling/drainage/puffiness of incision- also d/w pt and OT about bowels and overall Sx's  LOS: 10 days A FACE TO FACE EVALUATION WAS PERFORMED  Merinda Victorino 04/22/2024, 10:27 AM

## 2024-04-22 NOTE — Progress Notes (Signed)
 IP Rehab Bowel Program Documentation   Bowel Program Start time 1806  Dig Stim Indicated? Yes  Dig Stim Prior to Suppository or mini Enema X 1   Output from dig stim: Minimal  Ordered intervention: Suppository Yes , mini enema No ,   Repeat dig stim after Suppository or Mini enema  X 2,  Output? Minimal   Bowel Program Complete? Yes , handoff given Alray, RN  Patient Tolerated? Yes

## 2024-04-22 NOTE — Progress Notes (Signed)
 Assessment 43 y/o M who is s/p emergent C2-C7 PCDF for high grade spinal cord injury (03/28/24). Subsequently was discharged to rehab. Nsgy called back for wound concerns. On my evaluation, this appears more consistent with stitch abscess as opposed to a deep wound infection  LOS: 10 days    Plan: Keflex  for 7 days Will still plan to see him at 6 weeks postop   Subjective: Pt c/o generalized achiness and stiffness. Has some right paraspinal cervical pain that has been present since the surgery and is relatively unchanged. Afebrile. WBC 6  Objective: Vital signs in last 24 hours: Temp:  [97.7 F (36.5 C)-98.1 F (36.7 C)] 98 F (36.7 C) (09/08 0455) Pulse Rate:  [59-63] 63 (09/08 0455) Resp:  [17-20] 17 (09/08 0455) BP: (104-119)/(64-86) 104/64 (09/08 0455) SpO2:  [97 %-100 %] 97 % (09/08 0455)  Intake/Output from previous day: 09/07 0701 - 09/08 0700 In: 640 [P.O.:640] Out: 900 [Urine:900] Intake/Output this shift: No intake/output data recorded.  Exam: Posterior cervical incision: lower 2 cm with superficial dermal abscesses with serosanguinous drainage. Deeper or more superior palpation does not produce any drainage. No erythema. Remaining incision appears well healed Awake, alert, oriented 1/5 b/l bicep. Otherwise no movement Slight sensation BUE and RLE. No sensation LLE  Lab Results: Recent Labs    04/22/24 0534  WBC 6.9  HGB 10.3*  HCT 31.1*  PLT 403*   BMET Recent Labs    04/22/24 0534  NA 139  K 4.5  CL 99  CO2 25  GLUCOSE 110*  BUN 17  CREATININE 0.77  CALCIUM  9.2    Studies/Results: No results found.    Dorn JONELLE Glade 04/22/2024, 1:25 PM

## 2024-04-22 NOTE — Progress Notes (Signed)
 Occupational Therapy Session Note  Patient Details  Name: Tyrone Mckinney MRN: [id number] Date of Birth: 1980/12/13  Today's Date: 04/22/2024 OT Individual Time: 8954-8844 OT Individual Time Calculation (min): 70 min    Short Term Goals: Week 2:  OT Short Term Goal 1 (Week 2): Pt will roll laterally in bed with Max A x1 to assist with ADL routine. OT Short Term Goal 2 (Week 2): Pt will bathe face with Mod A using compensatory techniques/strategies as needed. OT Short Term Goal 3 (Week 2): Pt will self-feed with Mod A using compensatory techniques/strategies as needed.  Skilled Therapeutic Interventions/Progress Updates:    Pt resting in TIS w/c upon arrival. Pt requested to go outside to get some sunshine. OTA acquired Saebo for RUE wrist/finger flexion/extension. Pt transported outside. Continued dishcharge planning and SCI education. Pt appreciative for opportunity to feel the sun and the wind. Pt's affect improved.  Saebo applied to RUE wrist/finger flexors and extensors with good response. Pt unable to replicate after Saebo removed.  Saebo Stim One 330 pulse width 35 Hz pulse rate On 8 sec/ off 8 sec Ramp up/ down 2 sec Symmetrical Biphasic wave form  Max intensity at 500 Ohm load  No adverse reaction noted.  Pt remained in w/c with all needs within reach. Pt able to activate sip n puff.    Therapy Documentation Precautions:  Precautions Precautions: Cervical, Fall Precaution/Restrictions Comments: MAP >65 Restrictions Weight Bearing Restrictions Per Provider Order: Yes LUE Weight Bearing Per Provider Order: Non weight bearing Other Position/Activity Restrictions: Forearm based thumb spica LUE (per ortho); WHO RUE, B PRAFOs   Pain:  Pt denies pain this morning   Therapy/Group: Individual Therapy  [name] 04/22/2024, 12:03 PM

## 2024-04-22 NOTE — Plan of Care (Signed)
  Problem: Consults Goal: RH SPINAL CORD INJURY PATIENT EDUCATION Description:  See Patient Education module for education specifics.  Outcome: Progressing   Problem: SCI BOWEL ELIMINATION Goal: RH STG MANAGE BOWEL WITH ASSISTANCE Description: STG Manage Bowel with max Assistance. Outcome: Progressing   Problem: SCI BLADDER ELIMINATION Goal: RH STG MANAGE BLADDER WITH ASSISTANCE Description: STG Manage Bladder With max Assistance Outcome: Progressing   Problem: RH SKIN INTEGRITY Goal: RH STG SKIN FREE OF INFECTION/BREAKDOWN Description: Manage skin free of infection with max assistance Outcome: Progressing   Problem: RH SAFETY Goal: RH STG ADHERE TO SAFETY PRECAUTIONS W/ASSISTANCE/DEVICE Description: STG Adhere to Safety Precautions With max  Assistance/Device. Outcome: Progressing   Problem: RH PAIN MANAGEMENT Goal: RH STG PAIN MANAGED AT OR BELOW PT'S PAIN GOAL Description: <4 w/ prns Outcome: Progressing   Problem: RH KNOWLEDGE DEFICIT SCI Goal: RH STG INCREASE KNOWLEDGE OF SELF CARE AFTER SCI Description: Manage increase knowledge of self care after SCI with max assistance from spouse/ family using educational materials  provided.  Outcome: Progressing

## 2024-04-22 NOTE — Progress Notes (Signed)
 Occupational Therapy Session Note  Patient Details  Name: Naitik Serque Siegenthaler MRN: [id number] Date of Birth: [birth year]  Today's Date: 04/22/2024 OT Individual Time: 9299-9184 OT Individual Time Calculation (min): 75 min    Short Term Goals: Week 2:  OT Short Term Goal 1 (Week 2): Pt will roll laterally in bed with Max A x1 to assist with ADL routine. OT Short Term Goal 2 (Week 2): Pt will bathe face with Mod A using compensatory techniques/strategies as needed. OT Short Term Goal 3 (Week 2): Pt will self-feed with Mod A using compensatory techniques/strategies as needed.  Skilled Therapeutic Interventions/Progress Updates:    Skilled OT intervention with focus on bed mobility to increase independence with BADLs. Pt incontinent of bladder and required +2 assist for hygiene and clothing mgmt. OTA donned [facility] on pt before donning pants. Pt dependent for LB dressing tasks. Pt's posterior neck incision with draingage. RN and MD notified and attended to pt. Focus on repositioning in bed and RUE AROM. Pt remained in bed with all needs within reach. Sip n puff within reach and pt able to activate.   Therapy Documentation Precautions:  Precautions Precautions: Cervical, Fall Precaution/Restrictions Comments: MAP >65 Restrictions Weight Bearing Restrictions Per Provider Order: Yes LUE Weight Bearing Per Provider Order: Non weight bearing Other Position/Activity Restrictions: Forearm based thumb spica LUE (per ortho); WHO RUE, B PRAFOs   Pain:  Pt reports R/L shoulder/upper back pain; repositioning with relife noted.   Therapy/Group: Individual Therapy  [name] 04/22/2024, 12:02 PM

## 2024-04-22 NOTE — Progress Notes (Signed)
 IP Rehab Bowel Program Documentation  Day Shift 04/21/24  Bowel Program Start time 1800  Dig Stim Indicated? Yes  Dig Stim Prior to Suppository or mini Enema X 5   Output from dig stim:none Minimal  Ordered intervention: Suppository Yes , mini enema No ,   Repeat dig stim after Suppository or Mini enema: yes  Output? Minimal   Bowel Program Complete? No , handoff given: Neda RN  Patient Tolerated? Yes   Night shift 04/21/24  20:30 Dig stim x2  Output: Minimal

## 2024-04-23 MED ORDER — LIDOCAINE HCL URETHRAL/MUCOSAL 2 % EX GEL
1.0000 | Freq: Once | CUTANEOUS | Status: AC
  Administered 2024-04-23: 1 via TOPICAL
  Filled 2024-04-23: qty 6

## 2024-04-23 MED ORDER — SORBITOL 70 % SOLN
60.0000 mL | Freq: Once | Status: AC
  Administered 2024-04-23: 60 mL via ORAL
  Filled 2024-04-23: qty 60

## 2024-04-23 MED ORDER — TIZANIDINE HCL 4 MG PO TABS
4.0000 mg | ORAL_TABLET | Freq: Three times a day (TID) | ORAL | Status: DC
  Administered 2024-04-23 – 2024-05-14 (×62): 4 mg via ORAL
  Filled 2024-04-23 (×62): qty 1

## 2024-04-23 MED ORDER — SORBITOL 70 % SOLN: 45.0000 mL | Freq: Once | Status: DC

## 2024-04-23 NOTE — Progress Notes (Signed)
 Contacted Dr. Murrell regarding styloid fracture--->he recommended TDWB on left wrist for now and can progress to gradual WB after 6 weeks. X rays ordered for follow up. He can WBAT thru Left elbow/forearm.

## 2024-04-23 NOTE — Progress Notes (Signed)
 Occupational Therapy Session Note  Patient Details  Name: Tyrone Mckinney MRN: [id number] Date of Birth: [birth year]  Today's Date: 04/23/2024 OT Individual Time: 1030-1055 OT Individual Time Calculation (min): 25 min    Short Term Goals: Week 2:  OT Short Term Goal 1 (Week 2): Pt will roll laterally in bed with Max A x1 to assist with ADL routine. OT Short Term Goal 2 (Week 2): Pt will bathe face with Mod A using compensatory techniques/strategies as needed. OT Short Term Goal 3 (Week 2): Pt will self-feed with Mod A using compensatory techniques/strategies as needed.  Skilled Therapeutic Interventions/Progress Updates:    Saebo placed on pt's RUE wrist/finger flexors and extensors to facilitate functional grasp/release  Saebo Stim One 330 pulse width 35 Hz pulse rate On 8 sec/ off 8 sec Ramp up/ down 2 sec Symmetrical Biphasic wave form  Max intensity at 500 Ohm load  Pt responded well but unable to replicate independently. No adverse reactions noted.   Pt remained in w/c with all needs within reach. Nursing notified of pt's need to change brief.   Therapy Documentation Precautions:  Precautions Precautions: Cervical, Fall Precaution/Restrictions Comments: MAP >65 Restrictions Weight Bearing Restrictions Per Provider Order: Yes LUE Weight Bearing Per Provider Order: Non weight bearing Other Position/Activity Restrictions: Forearm based thumb spica LUE (per ortho); WHO RUE, B PRAFOs General:   Vital Signs:   Pain: Pain Assessment Pain Scale: 0-10 Pain Score: 8  Pain Location: Shoulder ADL: ADL Eating: Dependent Where Assessed-Eating: Bed level Grooming: Dependent Where Assessed-Grooming: Bed level Upper Body Bathing: Dependent Where Assessed-Upper Body Bathing: Bed level Lower Body Bathing: Dependent Where Assessed-Lower Body Bathing: Bed level Upper Body Dressing: Dependent Where Assessed-Upper Body Dressing: Bed level Lower Body Dressing:  Dependent Where Assessed-Lower Body Dressing: Bed level Toileting: Dependent Where Assessed-Toileting: Bed level Toilet Transfer: Not assessed Toilet Transfer Method: Not assessed Tub/Shower Transfer: Not assessed Walk-In Shower Transfer: Not assessed Vision   Perception    Praxis   Balance   Exercises:   Other Treatments:     Therapy/Group: Individual Therapy  [name] 04/23/2024, 1:01 PM

## 2024-04-23 NOTE — Patient Care Conference (Signed)
 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 Duwaine Barrs Social Worker Present: Graeme Jude, LCSW Nurse Present: Eulalio Falls, RN PT Present: Schuyler Batter, PT OT Present: Delon Sharps, OT;Charlena Cha, COTA Other (Discipline and Name): Cara Daring RN, AD PPS Coordinator present : Other (comment) Gaile RN, AD)     Current Status/Progress Goal Weekly Team Focus  Bowel/Bladder   Incontinent bladder/Bowel Program   Maintain positive results with bowel/bladder programs.   Avoid skin breakdown    Swallow/Nutrition/ Hydration               ADL's   bed mobility-tot A+2; BADLs bed level-tot A+2; TB transfers-tot A+2, limited understanding of prognosis   mod A overall   sitting balance, transfers, education    Mobility   tot +2 for all mobility, pt with trace activation in RLE and improving activation in LLE, with limited functional ability at this time, however, noted increase in pt participation in transfer.   mod a bed mobility, max a bed/chair transfers, supervision w/c  transfer training, SCI education, potential for PWC intro    Communication                Safety/Cognition/ Behavioral Observations               Pain   Pt c/o pain in shoulders/arms 8/10 on pain scale,  medicated with PRN Oxycodone .   Pain level less than or equal to 3 on pain scale.   Assess for pain q shift and PRN.    Skin   Edema/drainage to posterior neck incision, pt started on antibiotics. Dry dressing applied.   Skin to remain free from further breakdown/infection.  Assess skin q shift and PRN for breakdown.      Discharge Planning:  Uninsured. Pt will d/c to home with his ex-wife, and children. Reports his brother from [place]  is coming to visit-unsure on his length of stay. Fam meeting held last week. Family to begin looking for DME. SW will confirm there are no barriers to discharge.    Team Discussion: Patient was admitted post spinal cord injury/ Quadriplegia. Patient with hallucinations/ pain/ orthostatic hypotension/ spasticity/ incision infection/ neurogenic bowels: medications adjusted by MD. Progress limited by patient's understanding of prognosis.    Patient on target to meet rehab goals: Currently patient needs total + 2 with all ADL's , transfers.Overall goals are set for mod assistance, max bed/chair transfers.   *See Care Plan and progress notes for long and short-term goals.   Revisions to Treatment Plan:  Surgical Consult E stim Slide board  Teaching Needs: Safety, medications, transfers, incision care, toileting, AD education, bowel program, etc.    Current Barriers to Discharge: Decreased caregiver support, Home enviroment access/layout, Neurogenic bowel and bladder, and Wound care  Possible Resolutions to Barriers: Family Education  Medical Summary Current Status: PVRs low and inconitnent of bladder- on bowel program- incison drainage- not resulting with bowel program- constipated-  Barriers to Discharge: Medical stability;Infection/IV Antibiotics;Behavior/Mood;Complicated Wound;Neurogenic Bowel & Bladder;Incontinence;Self-care education;Spasticity;Weight bearing restrictions;Hypotension  Barriers to Discharge Comments: no insurance;  spasticity severe and progressing fast- L shoulder pain- constipation Possible Resolutions to [employer] Focus: respondiong to Saebo- but not keeping it- doing sorbitol  and incvreasing bowel meds- asymptomatic of low BP-  d/c date- 9/30   Continued Need for Acute Rehabilitation Level of Care: The patient requires daily medical management by a physician with specialized training in physical medicine and rehabilitation for the following reasons: Direction of a multidisciplinary physical rehabilitation program to maximize functional independence : Yes Medical management of patient stability for increased activity during participation in an intensive rehabilitation regime.: Yes Analysis of laboratory values and/or radiology reports with any subsequent need for medication adjustment and/or medical intervention. : Yes   I attest that I was present, lead the team conference, and concur with the assessment and plan of the team.   Chelsae Zanella Gayo 04/23/2024, 1119 am

## 2024-04-23 NOTE — Progress Notes (Signed)
 Occupational Therapy Session Note  Patient Details  Name: Tyrone Mckinney MRN: [id number] Date of Birth: February 11, 1981  Today's Date: 04/23/2024 OT Individual Time: 0700-0810 OT Individual Time Calculation (min): 70 min    Short Term Goals: Week 2:  OT Short Term Goal 1 (Week 2): Pt will roll laterally in bed with Max A x1 to assist with ADL routine. OT Short Term Goal 2 (Week 2): Pt will bathe face with Mod A using compensatory techniques/strategies as needed. OT Short Term Goal 3 (Week 2): Pt will self-feed with Mod A using compensatory techniques/strategies as needed.  Skilled Therapeutic Interventions/Progress Updates:    Pt resting in bed upon arrival. OTA removed Purewick and completed hygiene before continuing with therapy. BP 11076. Skilled OT intervention with focus on bed mobility, sitting balance, TB transfer, education, RUE AROM/PROM, and activity tolerance to increase independence with BADLs.   All bed mobility with tot A+2 including rolling R/L to facilitate donning pants. Supine>sit EOB with Tot A+2. Sitting balance EOB with max A. TB transfer to w/c with tot A+2 with max verbal cues for technique and sequencing. Pt remained in w/c with all needs within reach. Pt able to activated sip n puff call bell.   04/23/24 0800  SCIM SELF-CARE  Feeding (cutting, opening containers, pouring, bringing food to mouth, holding cup with fluid) 0  Bathing (soaping, washing, drying body and head, manipulating water tap) Upper Body;Lower Body  Bathing Upper Body 0  Bathing Lower Body 0  Dressing (clothes, shoes, permanent orthoses: dressing, wearing, undressing) Upper Body;Lower Body  Dressing Upper Body 0  Dressing Lower Body 0  Grooming (washing hands and face, brushing teeth, combing hair, shaving, applying makeup) 1  SCIM SELF-CARE SUBTOTAL ROW (0-20) 1    Therapy Documentation Precautions:  Precautions Precautions: Cervical, Fall Precaution/Restrictions Comments: MAP  >65 Restrictions Weight Bearing Restrictions Per Provider Order: Yes LUE Weight Bearing Per Provider Order: Non weight bearing Other Position/Activity Restrictions: Forearm based thumb spica LUE (per ortho); WHO RUE, B PRAFOs Pain: Pain Assessment Pain Scale: 0-10 Pain Score: 8  Pain Location: Shoulder Pain Intervention(s): Meds admin at close of session   Therapy/Group: Individual Therapy  [name] 04/23/2024, 8:15 AM

## 2024-04-23 NOTE — Progress Notes (Signed)
 PROGRESS NOTE   Subjective/Complaints:  Pt has severe spasms this AM- got AM meds- likely due to superficial staples incisional infection- which can increase spasticity- is on PO ABX-   Minimal to no results with bowel program Last night went well  ROS:  Per HPI   Pt denies SOB, abd pain, CP, N/V/C/D, and vision changes      Objective:   No results found.  Recent Labs    04/22/24 0534  WBC 6.9  HGB 10.3*  HCT 31.1*  PLT 403*    Recent Labs    04/22/24 0534  NA 139  K 4.5  CL 99  CO2 25  GLUCOSE 110*  BUN 17  CREATININE 0.77  CALCIUM  9.2         Intake/Output Summary (Last 24 hours) at 04/23/2024 0930 Last data filed at 04/23/2024 0301 Gross per 24 hour  Intake --  Output 350 ml  Net -350 ml         Physical Exam: Vital Signs Blood pressure 100/74, pulse 64, temperature 97.6 F (36.4 C), resp. rate 16, height 6' 4 (1.93 m), weight 88.9 kg, SpO2 100%.   General: awake, alert, appropriate, sitting up in bed- lowered to assess neck; ; NAD HENT: conjugate gaze; oropharynx moist CV: RRR no JVD Pulmonary: CTA B/L GI: less soft, NT, more distended - hypoactive BS Psychiatric: appropriate- very bright  Neurological: Ox3 Skin: bottom 1/4 of incision has a swollen puffy spot (looks like external incision infected from staple) a lot more serous drainage-draining on pillow Ext: no clubbing, cyanosis, or edema, LUE wrist splint- NWB   PRIOR EXAMS: Spasms appear more this AM than even yesterday- per therapy interfering in therapy  Eyes:     Comments: Right scleral hemorrhage  Genitourinary:    Comments: Incontinent of urine, condom loose Neurological:     Mental Status: He is alert.     Comments: Alert and oriented x 3. Normal insight and awareness. Intact Memory. Normal language and speech. Cranial nerve exam unremarkable. MMT: RUE: delt 2+/5, biceps 3- to 3/5, triceps, wrist and HI all 0/5.   LUE: delt 1/5, biceps 2/5, triceps, wrist, HI all 0/5. RLE: 1/5 HE and HAD, 0/5 everywhere else. LLE: 2/5 HF, HAD and KE, ADF/PF 2+/5. Sensory exam diminished below C5 on right and ~C6 on left. 1/2 sensation in trunk and both LE, as well as inguinal/rectal area. Sensed sl more in LLE. DTR's 1+ in BUE and 3+ in BLE. Pt with significant extensor and hip adductor tone in both legs. 4-5 beats of clonus RLE and 2-3 in LLE.     Assessment/Plan: 1. Functional deficits which require 3+ hours per day of interdisciplinary therapy in a comprehensive inpatient rehab setting. Physiatrist is providing close team supervision and 24 hour management of active medical problems listed below. Physiatrist and rehab team continue to assess barriers to discharge/monitor patient progress toward functional and medical goals  Care Tool:  Bathing        Body parts bathed by helper: Buttocks, Right upper leg, Left upper leg, Right lower leg, Left lower leg, Face, Right arm, Chest, Left arm, Abdomen, Front perineal area  Bathing assist Assist Level: Dependent - Patient 0%     Upper Body Dressing/Undressing Upper body dressing   What is the patient wearing?: Pull over shirt    Upper body assist Assist Level: Dependent - Patient 0%    Lower Body Dressing/Undressing Lower body dressing      What is the patient wearing?: Incontinence brief     Lower body assist Assist for lower body dressing: Dependent - Patient 0%     Toileting Toileting    Toileting assist Assist for toileting: Dependent - Patient 0%     Transfers Chair/bed transfer  Transfers assist     Chair/bed transfer assist level: 2 Helpers     Locomotion Ambulation   Ambulation assist   Ambulation activity did not occur: Safety/medical concerns          Walk 10 feet activity   Assist  Walk 10 feet activity did not occur: Safety/medical concerns        Walk 50 feet activity   Assist Walk 50 feet with 2 turns  activity did not occur: Safety/medical concerns         Walk 150 feet activity   Assist Walk 150 feet activity did not occur: Safety/medical concerns         Walk 10 feet on uneven surface  activity   Assist Walk 10 feet on uneven surfaces activity did not occur: Safety/medical concerns         Wheelchair     Assist Is the patient using a wheelchair?: Yes Type of Wheelchair: Manual    Wheelchair assist level: Dependent - Patient 0% Max wheelchair distance: 150    Wheelchair 50 feet with 2 turns activity    Assist        Assist Level: Dependent - Patient 0%   Wheelchair 150 feet activity     Assist      Assist Level: Dependent - Patient 0%   Blood pressure 100/74, pulse 64, temperature 97.6 F (36.4 C), resp. rate 16, height 6' 4 (1.93 m), weight 88.9 kg, SpO2 100%.  Medical Problem List and Plan: 1. 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 Darnella of [facility] on 03/28/24:             -patient may not yet shower             -ELOS/Goals: 28-32 days, mod to max assist goals             -No collar needed per Dr. Darnella             -Bilateral PRAFO's, R WHO.  -I discussed with patient the basic approach  we will be taking with his rehab. He has had some improvement in his ASIA score since he was seen by Dr. Cornelio on 8/18. However, he will still likely require a power wheelchair as his primary means of mobility at discharge and will need significant assistance from family. He seems motivated and willing to work, however.  Team conference - went over C4 ASIA C, low chance of getting muscles back that doesn't have currently; Spasticity; Neurogenic Bowel and bladder, Risk of AD, OH; and LFT's  and fracture of L wrist- also increased risk of DVT/PE Con't CIR PT and OT Team conference today  to determine LOS and discuss progress 2.  Antithrombotics: -DVT/anticoagulation:  Pharmaceutical: Lovenox  30mg  BID 9/1- is (-) for DVT 9/4- needs Elqiuis or Lovenox  for a total of 3 months             -antiplatelet therapy: ASA 81mg  daily 3. Pain Management: Cymbalta  30mg  nightly, Gabapentin  600mg  TID, Tylenol  1000mg  q6h, Robaxin  1000mg  q8h, oxycodone  10-15mg  q4h prn,  -Consider increasing Cymbalta  to 60mg  nightly for improved pain control if needed -Pt developing early hypertonicity in LE's. Will begin scheduling tizanidine  at 2mg  tid to start. Can have 2mg  tid prn as well -04/13/24 decrease tylenol  650mg  QID d/t transminitis as below 9/1- changed Cymbalta  to qday since had nightmares last night- didn't take trazodone . 9/3- still having nightmares, but not med related- might be PTSD? Vs subconscious dealing with his injury? 4. Mood/Behavior/Sleep: ego support             -antipsychotic agents: n/a             -Psych consult inpatient, no needs at that time -Pt will need emotional support. Seems to be pretty upbeat. Family sounds supportive. Will have Dr. Corina see pt at some point during admit  9/4- D/w pt and family today that he's very 'bright- but I'm expecting him to hit a wall when all this really hits.  5. Neuropsych/cognition: This patient is capable of making decisions on his own behalf. 6. Skin/Wound Care/large forehead laceration: staples removed 04/04/24.  Routine skin care.  9/8- incision on posterior neck puffy, draining, called Dr Darnella via NP 7. Fluids/Electrolytes/Nutrition: routine I&O, routine labs, continue vitamins/supplements (Ensure plus high protein BID, MVI)             -Regular diet 8. 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 Lester of [facility], continue ASA 81mg  daily 9. Superior endplate compression fx T5, T7, T8, T9, B/L upper thoracic posterior  paraspinal muscle injury and edema, interspinous ligament injury T2-3 and possibly T3-4: no intervention needed. Per Dr. Darnella.  10. B/L apical pulmonary contusions, small pulmonary lac RLL: aggressive pulmonary toilet 11. L Ulnar styloid fx: NWB LUE, splint, f/up Dr. Murrell 9/4- when gets closer to 6 weeks, will call Dr Marjorie to go over when can WBAT 9/9- cannot see a surgery done? So 6 weeks from 8/14 12. Orthostatic hypotension: continue Midodrine  20mg  TID, Strattera  25mg  daily also started per Dr. Jyl recs with goal perhaps of coming off midodrine . -continue TEDs and abdominal binder -04/14/24 BPs improving, monitor 9/1-9/2 in general BP's soft, in 100s-110s. Supine, but doing better- con't regimen 9/3-9/4 BP's running low 90's to 110's- no concern about OH currently per therapy -9/6-7/25 BP occasionally elevated briefly but improves, no AD sxs, monitor 9/8- BP 131 systolic this AM! Sitting upright Vitals:   04/20/24 0335 04/20/24 0747 04/20/24 1040 04/20/24 1359  BP: 99/64 (!) 140/83 99/60 118/75   04/20/24 1904 04/21/24 0306 04/21/24 1702 04/21/24 2111  BP: 100/66 104/66 112/76 119/86   04/22/24 0455 04/22/24 1652 04/22/24 1959 04/23/24 0513  BP: 104/64 (!) 157/89 101/75 100/74    13. Neurogenic bowel: on  bowel program: Dulcolax suppository 10mg  daily, Miralax  BID, SenokotS 1 tab BID             -will adjust bowel program to 1800 daily             -senokot-s 2 tabs q am and miralax  BID  9/5- no BM last night and none with another bowel program this AM in  spite of being done-- will give Sorbitol  60cc now and then SSE this evening with bowel program- ordered -04/21/24 small BM last night-- monitor 14. Neurogenic bladder: continue ISC q6h             -adjust I/O cath schedule to volumes of 300-500 cc -04/13/24 apparently used condom cath last night-- does feel urge to urinate, no bladder scans documented but pt says they were done but no ISC needed -Reviewed with nursing the plan for  him, no condom cath for now, attempt with urinal q4-6h and bladder scan -ISC if scan >250cc 9/4- still not voiding with toileting 9/5- I still think he's kicking off, but not emptying- he has no feeling of voiding, just leaking-- same 9/6-7, no caths still, but incontinent 9/8- No results last night- maybe had BM yesterday AM, Bowel program note said moderate stool- and small one day prior- if no results tonight, will need sorbitol - also advised even if has bm that day, needs bowel program 9/9- Will give 60cc Sorbitol  at 2pm and SE at 6pm at part of bowel program- cannot add Linzess since isn't insured- might need at home to take Mg citrate every few days- also taking a lot of Oxycodone  as well - hopefully this will decrease over time.  15. Polysubstance use disorder: UDS +cocaine, THC, Benzos-- provide counseling 16. 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 F/up outpatient.  17. ABLA: hgb stable 8-9 range, monitor twice weekly  -04/13/24 Hgb 9.6, monitor>> stable 9/4 at 10 18. Transaminitis: -04/13/24 Alk phos 286, AST 58, ALT 153-- could be from shock liver, monitor again on Monday. Decrease tylenol  to 650mg  QID. No abd pain/n/v but consider imaging/work up if those develop.  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 - pt had BP yesterday of 166/92- pt admits had HA- so had AD- educated pt and family on AD- will continue to educate staff, because we were not called on the probable  AD episode yesterday- 166/92 was BP- isolated spike in BP = AD esp in pt that has OH- went over with PA and staff about how to treat- sit pt up, loosen tight clothing, then if doesn't improve, cath him; do bowel program- that's 80% of all AD causes- if not improved, let PA/Doctor know 9/5- Had BP of 177/92 this AM- likely AD, although no HA- nurse did great catch and tried to do bowel program- no results- will order another round of Sorbitol  60cc with SSE this evening to get him cleaned out- esp with constipation -04/20/24 occasionally elevated BP but improves quickly, no AD symptoms, monitor  20. Spasticity-  9/5- will increase Baclofen  to 10 mg TID_ and first dose at 630am- d/w nursing and therapy that spasticity is progressing- once it starts, it's pretty fast initially- usually starts around 3-4 weeks and progresses, as I educated family about- and unfortunately we will try to keep up, but it's hard- he also has Zanaflex  2 mg TID AND 2 mg TID prn for additional  spasms 9/8- has improved spasticity-  9/9- spasms much worse this AM- like didn't get meds, but per chart, received Baclofen  at 546 this AM- will increase Zanaflex  to 4 mg TID- it's progressing so fast- not sure due to inflammation from mild incision infection- on keflex - can set it off 21. 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 If so, will order SLP eval.  22. Wound abscess- in superficial tissues, not actual deep wound infection per Dr Darnella  9/9- NS[facility] saw and put on keflex  for 7 days- will con't to monitor- is still draining- - per pt, Dr Darnella mentioned that if things weren't better at day 7 (9/15 at latest) that to cal him back.    I spent a total of 46    minutes on total care today- >50% coordination of care-  due to  D/w pt at length about spasticity- as well as about bowels- will re-give sorbitol - don't see easy way to increase bowel meds- also d/w OT about spasticity.  Team conference- today to f/u on progress-  LOS: 11 days A FACE TO FACE EVALUATION WAS PERFORMED  Tyrone Mckinney 04/23/2024, 9:30 AM

## 2024-04-23 NOTE — Progress Notes (Signed)
 Writer was called into patient's room during therapy session around 1300 with reports of elevated blood pressures from therapist. Writer completed bladder scan which showed 103 mls in patient's bladder and patient stated he voided within the hour. Patient denies any discomfort or pain. Writer gives patient scheduled laxative as patient states he has not had a good bowel movement in approximately one day and consumed a large amount of food over the weekend. Dr. Cornelio enters the room and assesses patient and instructs writer to complete dig stim several times with lidocaine  jelly to attempt to stimulate bowels to move, writer completes task with other nurse with no success of bowel movement. Blood pressure obtained 30 minutes later and has dropped down to 126/83 and awaiting bowel movement from patient. Patient remaining asymptomatic and resting in bed.

## 2024-04-23 NOTE — Progress Notes (Addendum)
 Pt c/o pain in shoulders/neck/arms and medicated x 3 with PRN Oxycodone . Positive effect noted. No bowels movements overnight outside of bowel program. No acute distress noted at this time. Continue with plan of care.

## 2024-04-23 NOTE — Progress Notes (Signed)
 Physical Therapy Weekly Progress Note  Patient Details  Name: Tyrone Mckinney MRN: [id number] Date of Birth: Jul 05, 1981  Beginning of progress report period: April 13, 2024 End of progress report period: April 23, 2024  Today's Date: 04/23/2024 PT Individual Time: 9099-9054, 8699-8644 PT Individual Time Calculation (min): 45 min, 55 min    Patient has met 3 of 3 short term goals.  Pt continues to require tot a +2 for bed mobility and max-tot +2 for slideboard transfer. He is better able to use trunk extensors for functional movement, but not yet for balance. Limited ability to use RUE for transfers, LUE still limited by WB precautions. Education on topics like AD and pressure relief have been consistently provided, but note poor carryover from session to session. Likely to introduce PWC soon pending pt's ability to functionally use RUE.   Patient continues to demonstrate the following deficits muscle weakness and muscle paralysis, impaired timing and sequencing, abnormal tone, and decreased coordination, and decreased sitting balance, decreased postural control, and difficulty maintaining precautions and therefore will continue to benefit from skilled PT intervention to increase functional independence with mobility.  Patient progressing toward long term goals..  Continue plan of care.  PT Short Term Goals Week 1:  PT Short Term Goal 1 (Week 1): Pt will roll to Left w/ total A consistently. PT Short Term Goal 1 - Progress (Week 1): Met PT Short Term Goal 2 (Week 1): Pt will tolerate OOB in w/c x 2 hours. PT Short Term Goal 2 - Progress (Week 1): Met PT Short Term Goal 3 (Week 1): Pt will tolerate sitting EOB x 5' w/ total A. PT Short Term Goal 3 - Progress (Week 1): Met Week 2:  PT Short Term Goal 1 (Week 2): Pt will maintain static sitting with UE support with max a >30 sec PT Short Term Goal 2 (Week 2): Pt will recall and teach back pressure relief strategies PT Short Term Goal 3  (Week 2): Pt will be able to direct transfer with board with min cueing  Skilled Therapeutic Interventions/Progress Updates:    Session 1: pt received in w/c and agreeable to therapy. Pt participated in slideboard transfer x 2 with tot +2. Session focused on propping with RUE placed posteriorly. Pt reports this stretches his shoulder, which has been tight. Worked on pushing through RUE using shoulder rotation to create elbow extension. Also positioned LUE into same position without weight bearing for shoulder stretch to prepare for using propped position for ADLs. Noted to have BIL subluxation, L>R. Pt was able to palpate and feel for greater understanding of condition. R shoulder able to be approximated in weight bearing position, pt would benefit from lateral leans to B elbows for shoulder NMR. Pt returned to chair in same manner and was left with needs in reach and sip and puff in place.  Session 2:  Pt asleep in bed on arrival but easily roused and agreeable to therapy, however reports feeling nausea. BP=124/78(92). nausea improved after changing positions in the bed. Pt states he felt like he was urinating, provided dependent brief change, but brief found to be dry. Of note, pt continues to report sensation of urinating, but found to have erection.  pressure found to be rising, topped at 159/96(114), HR=49. Out of concern for AD, doffed teds and sat him up in the bed and handed off care to [place], RN to assess bladder and bowel. MD made aware verbally, rest of team via secure chat. Pt remained  in care of nsg at end of session.   Therapy Documentation Precautions:  Precautions Precautions: Cervical, Fall Precaution/Restrictions Comments: MAP >65 Restrictions Weight Bearing Restrictions Per Provider Order: Yes LUE Weight Bearing Per Provider Order: Non weight bearing Other Position/Activity Restrictions: Forearm based thumb spica LUE (per ortho); WHO RUE, B PRAFOs General:       Therapy/Group: Individual Therapy  [name] 04/23/2024, 4:10 PM

## 2024-04-23 NOTE — Progress Notes (Signed)
 IP Rehab Bowel Program Documentation   Bowel Program Start time 1826  Dig Stim Indicated? Yes  Dig Stim Prior to Suppository or mini Enema X 1   Output from dig stim: Minimal  Ordered intervention: Suppository Yes , mini enema No ,   Repeat dig stim after Suppository or Mini enema  X 2,  Output? Minimal   Bowel Program Complete? Yes , handoff given Al[place], RN  Patient Tolerated? Yes

## 2024-04-23 NOTE — Progress Notes (Signed)
 Patient ID: Tyrone Mckinney, male   DOB: [birth year], 43 y.o.   MRN: [id number]  SW met with pt in room to provide updates from team conference,and d/c date 9/30. He will share updates with [name] and discuss family edu.   39- SW spoke with [name] on above. He will be here on Friday. Encouraged to attend scheduled therapy sessions during his visit here to [place] versus scheduling a family edu session to help reserve time for family edu. SW will continue to provide updates as available.  Graeme Jude, MSW, LCSW Office: [phone] Cell: [phone] Fax: [phone]

## 2024-04-24 ENCOUNTER — Inpatient Hospital Stay (HOSPITAL_COMMUNITY): Payer: Self-pay

## 2024-04-24 NOTE — Plan of Care (Signed)
  Problem: Consults Goal: RH SPINAL CORD INJURY PATIENT EDUCATION Description:  See Patient Education module for education specifics.  Outcome: Progressing   Problem: SCI BOWEL ELIMINATION Goal: RH STG MANAGE BOWEL WITH ASSISTANCE Description: STG Manage Bowel with max Assistance. Outcome: Progressing   Problem: SCI BLADDER ELIMINATION Goal: RH STG MANAGE BLADDER WITH ASSISTANCE Description: STG Manage Bladder With max Assistance Outcome: Progressing   Problem: RH SKIN INTEGRITY Goal: RH STG SKIN FREE OF INFECTION/BREAKDOWN Description: Manage skin free of infection with max assistance Outcome: Progressing   Problem: RH SAFETY Goal: RH STG ADHERE TO SAFETY PRECAUTIONS W/ASSISTANCE/DEVICE Description: STG Adhere to Safety Precautions With max  Assistance/Device. Outcome: Progressing   Problem: RH PAIN MANAGEMENT Goal: RH STG PAIN MANAGED AT OR BELOW PT'S PAIN GOAL Description: <4 w/ prns Outcome: Progressing   Problem: RH KNOWLEDGE DEFICIT SCI Goal: RH STG INCREASE KNOWLEDGE OF SELF CARE AFTER SCI Description: Manage increase knowledge of self care after SCI with max assistance from spouse/ family using educational materials  provided.  Outcome: Progressing

## 2024-04-24 NOTE — Progress Notes (Signed)
 Pt incontinent of stool and had a medium/brown/mushy bowel movement overnight. No acute distress noted at this time. Continue with plan of care.

## 2024-04-24 NOTE — Progress Notes (Signed)
 Physical Therapy Session Note  Patient Details  Name: Tyrone Mckinney MRN: [id number] Date of Birth: December 05, 1980  Today's Date: 04/24/2024 PT Individual Time: 9054-8954 and 8654-8576 PT Individual Time Calculation (min): 60 min and 38 min  Short Term Goals: Week 2:  PT Short Term Goal 1 (Week 2): Pt will maintain static sitting with UE support with max a >30 sec PT Short Term Goal 2 (Week 2): Pt will recall and teach back pressure relief strategies PT Short Term Goal 3 (Week 2): Pt will be able to direct transfer with board with min cueing  Skilled Therapeutic Interventions/Progress Updates: Tx1: Pt presented in bed agreeable to therapy.  Pt noted to have some BP dysregulation earlier in am therefore BP tracked as noted below. Bed placed into chair position at 53 degrees to monitor BP. Once completed performed rolling L/R total A to don pants and pull over hips. Completed dependent transfer via Maxi Move to TIS. Removed gown and donned shirt total A. Transported to day room and participated in AA Cybex Kinetron 90cm/sec for forced use of LLE and reciprocal movement of BLE. Pt noted to be able to depress pedal deeper than when last performed with this therapist. Pt remained in dayroom at end of session with current needs met awaiting OT session.   120/79 prior to session per nsg 112/82 (92) HR 57 HOB 53 degrees without TED hose 100/74 (83) HR 58 HOB 53 degrees without TED hose (TED hose then donned)  91/69 (77) HR 57 in TIS asymptomatic  Tx2: Pt presented in TIS agreeable to therapy. Pt states some soreness in neck, unrated. Pt transported to [facility] entrance for environmental change. Participated in NMES at R quad for ms activation. Four electrodes placed on quad to include vastus lateralis and vastus medialis. Performed for 18 min at 10 sec on 20 sec off. Pt encouraged to attempt quad set when performing activity. Pt transported back to room at end of session and requesting to remain in TIS  (condom cath on during this session). Pt left in TIS with sip and puff in place and NT present for vitals check.       Therapy Documentation Precautions:  Precautions Precautions: Cervical, Fall Precaution/Restrictions Comments: MAP >65 Restrictions Weight Bearing Restrictions Per Provider Order: Yes LUE Weight Bearing Per Provider Order: Partial weight bearing Other Position/Activity Restrictions: Forearm based thumb spica LUE (per ortho); WHO RUE, B PRAFOs General:   Vital Signs: Therapy Vitals Temp: 98 F (36.7 C) Temp Source: Oral Pulse Rate: (!) 53 Resp: 17 BP: 126/88 Patient Position (if appropriate): Lying Oxygen Therapy SpO2: 100 % O2 Device: Room Air Pain: Pain Assessment Pain Score: 2  Faces Pain Scale: Hurts a little bit PAINAD (Pain Assessment in Advanced Dementia) Breathing: normal Negative Vocalization: none Facial Expression: smiling or inexpressive Body Language: relaxed Consolability: no need to console PAINAD Score: 0 Mobility:   Locomotion :    Trunk/Postural Assessment :    Balance:   Exercises:   Other Treatments:      Therapy/Group: Individual Therapy  Tira Lafferty 04/24/2024, 4:40 PM

## 2024-04-24 NOTE — Progress Notes (Signed)
 IP Rehab Bowel Program Documentation    Bowel Program Start time [phone]   Dig Stim Indicated? Yes  Dig Stim Prior to Suppository or mini Enema X 1    Output from dig stim: Medium    Ordered intervention: Suppository Yes , mini enema No ,    Repeat dig stim after Suppository or Mini enema  X 1, Pt refused x2 dig stim   Output? Medium   Bowel Program Complete? Yes , handoff given Nightshift, RN   Patient Tolerated? Yes

## 2024-04-24 NOTE — Progress Notes (Signed)
 PROGRESS NOTE   Subjective/Complaints:   Pt reports had a real extra large BM last night- was being cleaned up and still stooling-  however per nursing, was medium Spasms better yesterday with increase in Zanaflex - educated pt about spasticity made worse by  infection/inflammation.    ROS:  Per HPI   Pt denies SOB, abd pain, CP, N/V/C/D, and vision changes      Objective:   No results found.  Recent Labs    04/22/24 0534  WBC 6.9  HGB 10.3*  HCT 31.1*  PLT 403*    Recent Labs    04/22/24 0534  NA 139  K 4.5  CL 99  CO2 25  GLUCOSE 110*  BUN 17  CREATININE 0.77  CALCIUM  9.2         Intake/Output Summary (Last 24 hours) at 04/24/2024 0818 Last data filed at 04/24/2024 0700 Gross per 24 hour  Intake 120 ml  Output --  Net 120 ml         Physical Exam: Vital Signs Blood pressure (!) 156/92, pulse 65, temperature (!) 97.5 F (36.4 C), resp. rate 18, height 6' 4 (1.93 m), weight 88.9 kg, SpO2 98%.    General: awake, alert, appropriate, supine in bed- bed set up higher; NAD HENT: conjugate gaze; oropharynx moist CV: regular rate and rhythm; no JVD Pulmonary: CTA B/L; no W/R/R- good air movement GI: soft, NT,  slightly less distended-  still hypoactive BS Psychiatric: appropriate- very bright affect Neurological: Ox3  Skin: bottom 1/4 of incision has a swollen puffy spot (looks like external incision infected from staple) a lot more serous drainage-draining on pillow Ext: no clubbing, cyanosis, or edema, LUE wrist splint- NWB   PRIOR EXAMS: Spasms appear more this AM than even yesterday- per therapy interfering in therapy  Eyes:     Comments: Right scleral hemorrhage  Genitourinary:    Comments: Incontinent of urine, condom loose Neurological:     Mental Status: He is alert.     Comments: Alert and oriented x 3. Normal insight and awareness. Intact Memory. Normal language and  speech. Cranial nerve exam unremarkable. MMT: RUE: delt 2+/5, biceps 3- to 3/5, triceps, wrist and HI all 0/5.  LUE: delt 1/5, biceps 2/5, triceps, wrist, HI all 0/5. RLE: 1/5 HE and HAD, 0/5 everywhere else. LLE: 2/5 HF, HAD and KE, ADF/PF 2+/5. Sensory exam diminished below C5 on right and ~C6 on left. 1/2 sensation in trunk and both LE, as well as inguinal/rectal area. Sensed sl more in LLE. DTR's 1+ in BUE and 3+ in BLE. Pt with significant extensor and hip adductor tone in both legs. 4-5 beats of clonus RLE and 2-3 in LLE.     Assessment/Plan: 1. Functional deficits which require 3+ hours per day of interdisciplinary therapy in a comprehensive inpatient rehab setting. Physiatrist is providing close team supervision and 24 hour management of active medical problems listed below. Physiatrist and rehab team continue to assess barriers to discharge/monitor patient progress toward functional and medical goals  Care Tool:  Bathing        Body parts bathed by helper: Buttocks, Right upper leg, Left upper leg, Right lower leg,  Left lower leg, Face, Right arm, Chest, Left arm, Abdomen, Front perineal area     Bathing assist Assist Level: Dependent - Patient 0%     Upper Body Dressing/Undressing Upper body dressing   What is the patient wearing?: Pull over shirt    Upper body assist Assist Level: Dependent - Patient 0%    Lower Body Dressing/Undressing Lower body dressing      What is the patient wearing?: Incontinence brief     Lower body assist Assist for lower body dressing: Dependent - Patient 0%     Toileting Toileting    Toileting assist Assist for toileting: Dependent - Patient 0%     Transfers Chair/bed transfer  Transfers assist     Chair/bed transfer assist level: 2 Helpers     Locomotion Ambulation   Ambulation assist   Ambulation activity did not occur: Safety/medical concerns          Walk 10 feet activity   Assist  Walk 10 feet activity did  not occur: Safety/medical concerns        Walk 50 feet activity   Assist Walk 50 feet with 2 turns activity did not occur: Safety/medical concerns         Walk 150 feet activity   Assist Walk 150 feet activity did not occur: Safety/medical concerns         Walk 10 feet on uneven surface  activity   Assist Walk 10 feet on uneven surfaces activity did not occur: Safety/medical concerns         Wheelchair     Assist Is the patient using a wheelchair?: Yes Type of Wheelchair: Manual    Wheelchair assist level: Dependent - Patient 0% Max wheelchair distance: 150    Wheelchair 50 feet with 2 turns activity    Assist        Assist Level: Dependent - Patient 0%   Wheelchair 150 feet activity     Assist      Assist Level: Dependent - Patient 0%   Blood pressure (!) 156/92, pulse 65, temperature (!) 97.5 F (36.4 C), resp. rate 18, height 6' 4 (1.93 m), weight 88.9 kg, SpO2 98%.  Medical Problem List and Plan: 1. 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 Darnella of [facility] on 03/28/24:             -patient may not yet shower             -ELOS/Goals: 28-32 days, mod to max assist goals             -No collar needed per Dr. Darnella             -Bilateral PRAFO's, R WHO.  -I discussed with patient the basic approach  we will be taking with his rehab. He has had some improvement in his ASIA score since he was seen by Dr. Cornelio on 8/18. However, he will still likely require a power wheelchair as his primary means of mobility at discharge and will need significant assistance from family. He seems motivated and willing to work, however.  Team conference - went over C4 ASIA C, low chance of  getting muscles back that doesn't have currently; Spasticity; Neurogenic Bowel and bladder, Risk of AD, OH; and  LFT's and fracture of L wrist- also increased risk of DVT/PE D/c 9/30 Con't CIR PT and OT- of note, no medicaid yet Will switch PRAFOs to Prevalons due to spasticity 2.  Antithrombotics: -DVT/anticoagulation:  Pharmaceutical: Lovenox  30mg  BID 9/1- is (-) for DVT 9/4- needs Elqiuis or Lovenox  for a total of 3 months             -antiplatelet therapy: ASA 81mg  daily 3. Pain Management: Cymbalta  30mg  nightly, Gabapentin  600mg  TID, Tylenol  1000mg  q6h, Robaxin  1000mg  q8h, oxycodone  10-15mg  q4h prn,  -Consider increasing Cymbalta  to 60mg  nightly for improved pain control if needed -Pt developing early hypertonicity in LE's. Will begin scheduling tizanidine  at 2mg  tid to start. Can have 2mg  tid prn as well -04/13/24 decrease tylenol  650mg  QID d/t transminitis as below 9/1- changed Cymbalta  to qday since had nightmares last night- didn't take trazodone . 9/3- still having nightmares, but not med related- might be PTSD? Vs subconscious dealing with his injury? 9/10- pain is stable- usually controlled 4. Mood/Behavior/Sleep: ego support             -antipsychotic agents: n/a             -Psych consult inpatient, no needs at that time -Pt will need emotional support. Seems to be pretty upbeat. Family sounds supportive. Will have Dr. Corina see pt at some point during admit  9/4- D/w pt and family today that he's very 'bright- but I'm expecting him to hit a wall when all this really hits.  9/10- mood still very bright 5. Neuropsych/cognition: This patient is capable of making decisions on his own behalf. 6. Skin/Wound Care/large forehead laceration: staples removed 04/04/24.  Routine skin care.  9/8- incision on posterior neck puffy, draining, called Dr Darnella via NP 7. Fluids/Electrolytes/Nutrition: routine I&O, routine labs, continue vitamins/supplements (Ensure plus high protein BID,  MVI)             -Regular diet 8. 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 Lester of [facility], continue ASA 81mg  daily 9. Superior endplate compression fx T5, T7, T8, T9, B/L upper thoracic posterior paraspinal muscle injury and edema, interspinous ligament injury T2-3 and possibly T3-4: no intervention needed. Per Dr. Darnella.  10. B/L apical pulmonary contusions, small pulmonary lac RLL: aggressive pulmonary toilet 11. L Ulnar styloid fx: NWB LUE, splint, f/up Dr. Murrell 9/4- when gets closer to 6 weeks, will call Dr Marjorie to go over when can WBAT 9/9- cannot see a surgery done? So 6 weeks from 8/14- called Dr Marjorie- ordered a wrist xray to f/u- and unlikely to be able to [facility] prior to d/c  12. Orthostatic hypotension: continue Midodrine  20mg  TID, Strattera  25mg  daily also started per Dr. Jyl recs with goal perhaps of coming off midodrine . -continue TEDs and abdominal binder -04/14/24 BPs improving, monitor 9/1-9/2 in general BP's soft, in 100s-110s. Supine, but doing better- con't regimen 9/3-9/4 BP's running low 90's to 110's- no concern about OH currently per therapy -9/6-7/25 BP occasionally elevated briefly but improves, no AD sxs, monitor 9/8- BP 131 systolic this AM! Sitting upright 9/10- BP soft, but OK- episode of AD yesterday- no HA Vitals:   04/21/24 1702 04/21/24 2111 04/22/24 0455 04/22/24 1652  BP: 112/76 119/86 104/64 (!) 157/89   04/22/24 1959 04/23/24 0513 04/23/24 1300 04/23/24 1441  BP: 101/75 100/74 (!) 157/92 126/83   04/23/24 1829 04/23/24 1945 04/24/24 0512 04/24/24 0700  BP: 109/74 104/76 99/68 (!) 156/92    13. Neurogenic bowel:  on  bowel program: Dulcolax suppository 10mg  daily, Miralax  BID, SenokotS 1 tab BID             -will adjust bowel program to 1800 daily             -senokot-s 2 tabs q am and miralax  BID  9/5- no BM last night and none with another bowel program this AM in spite of being done-- will give Sorbitol  60cc now and then SSE this  evening with bowel program- ordered -04/21/24 small BM last night-- monitor 9/9- Will give 60cc Sorbitol  at 2pm and SE at 6pm at part of bowel program- cannot add Linzess since isn't insured- might need at home to take Mg citrate every few days- also taking a lot of Oxycodone  as well - hopefully this will decrease over time.  9/10- Had a medium BM last night after Sorbitol  and SSE- but pt said was extra large? Will get KUB to see where we are at 14. Neurogenic bladder: continue ISC q6h             -adjust I/O cath schedule to volumes of 300-500 cc -04/13/24 apparently used condom cath last night-- does feel urge to urinate, no bladder scans documented but pt says they were done but no ISC needed -Reviewed with nursing the plan for him, no condom cath for now, attempt with urinal q4-6h and bladder scan -ISC if scan >250cc 9/4- still not voiding with toileting 9/5- I still think he's kicking off, but not emptying- he has no feeling of voiding, just leaking-- same 9/6-7, no caths still, but incontinent 9/10- incontinent of bladder constantly- PVRs always <200cc-  15. Polysubstance use disorder: UDS +cocaine, THC, Benzos-- provide counseling 16. 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 F/up outpatient.  17. ABLA: hgb stable 8-9 range, monitor twice weekly  -04/13/24 Hgb 9.6, monitor>> stable 9/4 at 10 18. Transaminitis: -04/13/24 Alk phos 286, AST 58, ALT 153-- could be from shock liver, monitor again on Monday. Decrease tylenol  to 650mg  QID. No abd pain/n/v but consider imaging/work up if those develop.  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 - pt had BP yesterday of 166/92- pt admits had HA- so had AD- educated pt and family on AD- will continue to educate staff, because we were not called on the probable AD episode yesterday- 166/92 was BP- isolated spike in BP = AD esp in pt that has OH- went over with PA and staff about how to treat- sit pt up, loosen tight clothing, then if doesn't improve, cath him; do bowel program- that's 80% of all AD causes- if not improved, let PA/Doctor know 9/5- Had BP of 177/92 this AM- likely AD, although no HA- nurse did great catch and tried to do bowel program- no results- will order another round of Sorbitol  60cc with SSE this evening to get him cleaned out- esp with constipation -04/20/24 occasionally elevated BP but improves quickly, no AD symptoms, monitor  9/9- had AD yesterday  AM- no HA or nasal congestions but BP 160s/90s-  went through AD protocol- nursing did great- likely due to constipation 20. Spasticity-  9/5- will increase Baclofen  to 10 mg TID_ and first dose at 630am- d/w nursing and therapy that spasticity is progressing- once it starts, it's pretty fast initially- usually starts around 3-4 weeks and progresses, as I educated family about- and unfortunately we will try to keep up, but it's hard- he also has Zanaflex  2 mg TID AND 2 mg TID prn for additional spasms 9/8- has improved spasticity-  9/9- spasms much worse this AM- like didn't get meds, but per chart, received Baclofen  at 546 this AM- will increase Zanaflex  to 4 mg TID- it's progressing so fast- not sure due to inflammation from mild incision infection- on keflex - can set it off 21. 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 If so, will order SLP eval.  22. Wound abscess- in superficial tissues, not actual deep wound infection per Dr Darnella  9/9- NSU saw and put on keflex  for 7 days- will con't to monitor- is still draining- - per pt, Dr Darnella mentioned that if things weren't better at day 7 (9/15 at latest) that to cal him back.     I spent a total of 56    minutes on total care today- >50% coordination of care- due to   D/w pt about AD - prolonged education , as well as Prevalons ordered for foot protection - cannot use PRAFOs well after spasticity gets worse; also ordered KUB to f/u on Constipation. Also spoke with nursing about Prevalons and bowel program   LOS: 12 days A FACE TO FACE EVALUATION WAS PERFORMED  Marit Goodwill 04/24/2024, 8:18 AM

## 2024-04-24 NOTE — Progress Notes (Signed)
 Physical Therapy Session Note  Patient Details  Name: Tyrone Mckinney MRN: [id number] Date of Birth: 12/07/1980  Today's Date: 04/24/2024 PT Individual Time: 0800-0900 PT Individual Time Calculation (min): 60 min   Short Term Goals: Week 2:  PT Short Term Goal 1 (Week 2): Pt will maintain static sitting with UE support with max a >30 sec PT Short Term Goal 2 (Week 2): Pt will recall and teach back pressure relief strategies PT Short Term Goal 3 (Week 2): Pt will be able to direct transfer with board with min cueing  Skilled Therapeutic Interventions/Progress Updates:   Pt received semi-fowlers in bed, agreeable to therapy. No reported pain at this time, discomfort in neck (R side > L side). Pt BP taken, found to be hypertensive. BP's listed below.   Semi-fowlers: 175/101 (120) 50bpm   162/92 (110) 50bpm  RN notified, bladder scan performed. Nsg approved further therapy as pt was asymptomatic. Pt was fastened down w/ straps and pt put into tilt position in Kreg bed, providing WB through BLE to decrease tone. NMES performed on R vastus lateralis & medialis ~7 minutes to improve R quad contraction, improve functional activity tolerance and decrease caregiver burden. Nexwave device used, 17-46mA w/ 30:10 ratio. Pt cued to push knee down into bed during 10s, then rest. Pt began to feel nauseous/light-headed. BP taken 85/47 (55). Pt returned to supine, BP returned to 161/89 (107). Per pt, symptoms resolved w/in 5 minutes of returning to supine. Pt requested bed be elevated to see out the window better. Pt given sip and puff and call bell for TV audio and all other needs in reach. Pillows placed under BUE w/ intent to approximate B glenohumeral joint as subluxation noted in prior session.   Therapy Documentation Precautions:  Precautions Precautions: Cervical, Fall Precaution/Restrictions Comments: MAP >65 Restrictions Weight Bearing Restrictions Per Provider Order: Yes LUE Weight Bearing  Per Provider Order: Partial weight bearing Other Position/Activity Restrictions: Forearm based thumb spica LUE (per ortho); WHO RUE, B PRAFOs   Therapy/Group: Individual Therapy  Oneil Grumbles 04/24/2024, 1:16 PM

## 2024-04-24 NOTE — Progress Notes (Signed)
 Occupational Therapy Session Note  Patient Details  Name: Tyrone Mckinney MRN: [id number] Date of Birth: 09/29/1980  Today's Date: 04/24/2024 OT Individual Time: 1102-1200 OT Individual Time Calculation (min): 58 min    Short Term Goals: Week 1:  OT Short Term Goal 1 (Week 1): Pt will roll laterally in bed with Max A x1 to assist with ADL routine. OT Short Term Goal 1 - Progress (Week 1): Progressing toward goal OT Short Term Goal 2 (Week 1): Pt will bathe face with Mod A using compensatory techniques/strategies as needed. OT Short Term Goal 2 - Progress (Week 1): Progressing toward goal OT Short Term Goal 3 (Week 1): Pt will self-feed with Mod A using compensatory techniques/strategies as needed. OT Short Term Goal 3 - Progress (Week 1): Progressing toward goal  Skilled Therapeutic Interventions/Progress Updates: Patient received relaxing in therapy day room awaiting therapist reclined in tilt in space w/c. Patient assisted reporting continued pain in R shoulder and continued sensory changes in BUE's, but nothing patient reports as limitation to participation. Patient w/c tilted into up right position with RUE placed in wt bearing/balancing position with fingers flexed, worked on forward lean and triceps activation and trunk control to regain full up right posture. Patient needing Max/Total assist for hand position and postural corrections but displays activation of thoracic extension to right self. Discussed oblique engagement to improve trunk righting. Continued treatment with Total assist of 2 for slide board transfer to EOM working on static balance and finding COG. Patient with good awareness of eminent LOB if unassisted, but able to initiate head and upper thoracic movements to work on righting LOBs. Assisted patient with sitting to sidelying with L elbow support and hand over hand assist to hook the leg gait belt for LE management into the bed. Total assist with transition from sitting to  supine,but good reception to use of leg straps. Supine worked on biceps flexion and triceps activation. Patient with only trace triceps against gravity. Assisted patient back to sitting EOB. Total assist of 2 for SB transfer back to tilt in space chair. Patient taken back to his room. Assisted with UE position with pillows and sip and puff call bell. Continue with skilled POC to develop independence in directing care and assisting self with modified grasp.       Therapy Documentation Precautions:  Precautions Precautions: Cervical, Fall Precaution/Restrictions Comments: MAP >65 Restrictions Weight Bearing Restrictions Per Provider Order: Yes LUE Weight Bearing Per Provider Order: Partial weight bearing Other Position/Activity Restrictions: Forearm based thumb spica LUE (per ortho); WHO RUE, B PRAFOs General:   Vital Signs: Therapy Vitals BP: 114/77 Patient Position (if appropriate): Lying Pain: Pain Assessment Pain Scale: 0-10 Pain Score: 8  Pain Location: Shoulder Pain Intervention(s): Medication (See eMAR) ADL: ADL Eating: Dependent Where Assessed-Eating: Bed level Grooming: Dependent Where Assessed-Grooming: Bed level Upper Body Bathing: Dependent Where Assessed-Upper Body Bathing: Bed level Lower Body Bathing: Dependent Where Assessed-Lower Body Bathing: Bed level Upper Body Dressing: Dependent Where Assessed-Upper Body Dressing: Bed level Lower Body Dressing: Dependent Where Assessed-Lower Body Dressing: Bed level Toileting: Dependent Where Assessed-Toileting: Bed level Toilet Transfer: Not assessed Toilet Transfer Method: Not assessed Tub/Shower Transfer: Not assessed Walk-In Shower Transfer: Not assessed   Therapy/Group: Individual Therapy  Tyrone Mckinney 04/24/2024, 12:12 PM

## 2024-04-25 DIAGNOSIS — R252 Cramp and spasm: Secondary | ICD-10-CM

## 2024-04-25 DIAGNOSIS — G904 Autonomic dysreflexia: Secondary | ICD-10-CM

## 2024-04-25 LAB — COMPREHENSIVE METABOLIC PANEL WITH GFR
ALT: 74 U/L — ABNORMAL HIGH (ref 0–44)
AST: 29 U/L (ref 15–41)
Albumin: 3 g/dL — ABNORMAL LOW (ref 3.5–5.0)
Alkaline Phosphatase: 199 U/L — ABNORMAL HIGH (ref 38–126)
Anion gap: 13 (ref 5–15)
BUN: 16 mg/dL (ref 6–20)
CO2: 26 mmol/L (ref 22–32)
Calcium: 9.4 mg/dL (ref 8.9–10.3)
Chloride: 99 mmol/L (ref 98–111)
Creatinine, Ser: 0.68 mg/dL (ref 0.61–1.24)
GFR, Estimated: >60 mL/min (ref >60)
Glucose, Bld: 108 mg/dL — ABNORMAL HIGH (ref 70–99)
Potassium: 4.3 mmol/L (ref 3.5–5.1)
Sodium: 138 mmol/L (ref 135–145)
Total Bilirubin: 0.4 mg/dL (ref 0.0–1.2)
Total Protein: 7 g/dL (ref 6.5–8.1)

## 2024-04-25 LAB — CBC
HCT: 29.8 % — ABNORMAL LOW (ref 39.0–52.0)
Hemoglobin: 10 g/dL — ABNORMAL LOW (ref 13.0–17.0)
MCH: 29.9 pg (ref 26.0–34.0)
MCHC: 33.6 g/dL (ref 30.0–36.0)
MCV: 89.2 fL (ref 80.0–100.0)
Platelets: 389 K/uL (ref 150–400)
RBC: 3.34 MIL/uL — ABNORMAL LOW (ref 4.22–5.81)
RDW: 12.3 % (ref 11.5–15.5)
WBC: 4.9 K/uL (ref 4.0–10.5)
nRBC: 0 % (ref 0.0–0.2)

## 2024-04-25 NOTE — Progress Notes (Signed)
 Physical Therapy Session Note  Patient Details  Name: Tyrone Mckinney MRN: [id number] Date of Birth: Dec 13, 1980  Today's Date: 04/25/2024 PT Individual Time: 0830-0930 PT Individual Time Calculation (min): 60 min   Short Term Goals: Week 2:  PT Short Term Goal 1 (Week 2): Pt will maintain static sitting with UE support with max a >30 sec PT Short Term Goal 2 (Week 2): Pt will recall and teach back pressure relief strategies PT Short Term Goal 3 (Week 2): Pt will be able to direct transfer with board with min cueing  Skilled Therapeutic Interventions/Progress Updates:    pt received in bed and agreeable to therapy. No complaint of pain. Session focused on using kreg bed standing feature as tilt table for upright tolerance and weight bearing to normalize tone and promote neuromuscular return. Pt also set up with NMES via nex wave device to BIL rectus femoris for neuromuscular return as described below.   NMES 36mA R rectus femoris 29-35mA L rectus femoris Pt with ability to contract L side with NMES with activation palpated on L but trace only noted on R. Pt able to feel RLE NMES turning on/off, but diminished. Pt's skin found to be intact and without erythema on removal of pads.   Vitals: Chair position start of session :110/70 (80), HR=61 20 degrees tilt: 99/69 (79), HR=58             : 99/73 (82), HR=59 30 degrees tilt: 90/63 (72), HR=65   : 94/65 (73), HR=68 40 degrees tilt: 79/58 (66), HR=72 [8-9/10 light-headed, about to pass out] 30 degrees tilt: 86/69 (74), HR=68 [6-7/10 ]   : 105/75 (86), HR=58 [6/10] 20 degrees tilt: 116/77 (90), HR=51 [5-6/10] 10 degrees tilt: 129/80(95), HR=50 [4/10]   Pt had incidence of light headedness at 40 deg, recovered to asymptomatic with gradual return to flat. Pt tolerated chair position at end of session to continue increasing upright tolerance. Vitals WNL at end of session, pt left with sip and puff in reach.       Therapy  Documentation Precautions:  Precautions Precautions: Cervical, Fall Precaution/Restrictions Comments: MAP >65 Restrictions Weight Bearing Restrictions Per Provider Order: Yes LUE Weight Bearing Per Provider Order: Partial weight bearing Other Position/Activity Restrictions: Forearm based thumb spica LUE (per ortho); WHO RUE, B PRAFOs General:       Therapy/Group: Individual Therapy  [name] 04/25/2024, 8:34 AM

## 2024-04-25 NOTE — Progress Notes (Signed)
 PROGRESS NOTE   Subjective/Complaints:  Pt with medium output with BP last night. Stool was mushy last night and again had mushy bm this morning. Pain and spasticity seem to be improved. Therapy about to get him out of bed when I saw him this morning.    ROS: Patient denies fever, rash, sore throat, blurred vision, dizziness, nausea, vomiting, diarrhea, cough, shortness of breath or chest pain,   headache, or mood change.   Objective:   DG Abd 1 View Result Date: 04/24/2024 CLINICAL DATA:  Constipation. EXAM: ABDOMEN - 1 VIEW COMPARISON:  March 28, 2024. FINDINGS: No abnormal bowel dilatation is noted. Large amount of stool seen throughout the colon. No radio-opaque calculi or other significant radiographic abnormality are seen. IMPRESSION: Large stool burden. No abnormal bowel dilatation. Electronically Signed   By: Lynwood Landy Raddle M.D.   On: 04/24/2024 10:52    Recent Labs    04/25/24 0448  WBC 4.9  HGB 10.0*  HCT 29.8*  PLT 389    Recent Labs    04/25/24 0448  NA 138  K 4.3  CL 99  CO2 26  GLUCOSE 108*  BUN 16  CREATININE 0.68  CALCIUM  9.4         Intake/Output Summary (Last 24 hours) at 04/25/2024 0949 Last data filed at 04/25/2024 9177 Gross per 24 hour  Intake 540 ml  Output 800 ml  Net -260 ml         Physical Exam: Vital Signs Blood pressure 108/69, pulse 60, temperature 98.1 F (36.7 C), temperature source Oral, resp. rate 18, height 6' 4 (1.93 m), weight 88.9 kg, SpO2 98%.    Constitutional: No distress . Vital signs reviewed. HEENT: NCAT, EOMI, oral membranes moist Neck: supple Cardiovascular: RRR without murmur. No JVD    Respiratory/Chest: CTA Bilaterally without wheezes or rales. Normal effort    GI/Abdomen: BS +, non-tender, non-distended Ext: no clubbing, cyanosis, or edema Psych: pleasant and cooperative  Skin: bottom 1/4 of incision has a swollen puffy spot (looks like  external incision infected from staple) a lot more serous drainage-draining on pillow Neurological:     Mental Status: He is alert.     Comments: Alert and oriented x 3. Normal insight and awareness. Intact Memory. Normal language and speech. Cranial nerve exam unremarkable. MMT: RUE: delt 2+/5, biceps 3- to 3/5, triceps maybe trace now?, wrist and HI all 0/5.  LUE: delt 1/5, biceps 2/5, triceps, wrist, HI all 0/5. RLE: 1/5 HE and HAD, 0/5 everywhere else. LLE: 2/5 HF, HAD and KE, ADF/PF 2+/5. Sensory exam diminished below C5 on right and ~C6 on left. 1/2 sensation in trunk and both LE, as well as inguinal/rectal area. Sensed sl more in LLE. DTR's 1+ in BUE and 3+ in BLE. Pt with improved extensor and hip adductor tone in both legs. 2-3 beats of clonus RLE and 1-2 in LLE.     Assessment/Plan: 1. Functional deficits which require 3+ hours per day of interdisciplinary therapy in a comprehensive inpatient rehab setting. Physiatrist is providing close team supervision and 24 hour management of active medical problems listed below. Physiatrist and rehab team continue to assess barriers to discharge/monitor  patient progress toward functional and medical goals  Care Tool:  Bathing        Body parts bathed by helper: Buttocks, Right upper leg, Left upper leg, Right lower leg, Left lower leg, Face, Right arm, Chest, Left arm, Abdomen, Front perineal area     Bathing assist Assist Level: Dependent - Patient 0%     Upper Body Dressing/Undressing Upper body dressing   What is the patient wearing?: Pull over shirt    Upper body assist Assist Level: Dependent - Patient 0%    Lower Body Dressing/Undressing Lower body dressing      What is the patient wearing?: Incontinence brief     Lower body assist Assist for lower body dressing: Dependent - Patient 0%     Toileting Toileting    Toileting assist Assist for toileting: Dependent - Patient 0%     Transfers Chair/bed  transfer  Transfers assist     Chair/bed transfer assist level: 2 Helpers     Locomotion Ambulation   Ambulation assist   Ambulation activity did not occur: Safety/medical concerns          Walk 10 feet activity   Assist  Walk 10 feet activity did not occur: Safety/medical concerns        Walk 50 feet activity   Assist Walk 50 feet with 2 turns activity did not occur: Safety/medical concerns         Walk 150 feet activity   Assist Walk 150 feet activity did not occur: Safety/medical concerns         Walk 10 feet on uneven surface  activity   Assist Walk 10 feet on uneven surfaces activity did not occur: Safety/medical concerns         Wheelchair     Assist Is the patient using a wheelchair?: Yes Type of Wheelchair: Manual    Wheelchair assist level: Dependent - Patient 0% Max wheelchair distance: 150    Wheelchair 50 feet with 2 turns activity    Assist        Assist Level: Dependent - Patient 0%   Wheelchair 150 feet activity     Assist      Assist Level: Dependent - Patient 0%   Blood pressure 108/69, pulse 60, temperature 98.1 F (36.7 C), temperature source Oral, resp. rate 18, height 6' 4 (1.93 m), weight 88.9 kg, SpO2 98%.  Medical Problem List and Plan: 1. 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 Darnella of [facility] on 03/28/24:             -patient may not yet shower             -ELOS/Goals: 28-32 days, mod to max assist goals             -No collar needed per Dr. Darnella             -Bilateral PRAFO's, R WHO.  -I discussed with patient the basic approach  we will be taking with his rehab. He has had some improvement in his ASIA score since he was  seen by Dr. Cornelio on 8/18. However, he will still likely require a power wheelchair as his primary means of mobility at discharge and will need significant assistance from family. He seems  motivated and willing to work, however.  Team conference - went over C4 ASIA C, low chance of getting muscles back that doesn't have currently; Spasticity; Neurogenic Bowel and bladder, Risk of AD, OH; and LFT's and fracture of L wrist- also increased risk of DVT/PE D/c 9/30   Will switch PRAFOs to Prevalons due to spasticity -Continue CIR therapies including PT, OT  2.  Antithrombotics: -DVT/anticoagulation:  Pharmaceutical: Lovenox  30mg  BID 9/1- is (-) for DVT 9/4- needs Elqiuis or Lovenox  for a total of 3 months             -antiplatelet therapy: ASA 81mg  daily 3. Pain Management: Cymbalta  30mg  nightly, Gabapentin  600mg  TID, Tylenol  1000mg  q6h, Robaxin  1000mg  q8h, oxycodone  10-15mg  q4h prn,  -Consider increasing Cymbalta  to 60mg  nightly for improved pain control if needed -Pt developing early hypertonicity in LE's. Will begin scheduling tizanidine  at 2mg  tid to start. Can have 2mg  tid prn as well -04/13/24 decrease tylenol  650mg  QID d/t transminitis as below 9/1- changed Cymbalta  to qday since had nightmares last night- didn't take trazodone . 9/3- still having nightmares, but not med related- might be PTSD? Vs subconscious dealing with his injury? 9/10-11- pain is stable- usually controlled 4. Mood/Behavior/Sleep: ego support             -antipsychotic agents: n/a             -Psych consult inpatient, no needs at that time -Pt will need emotional support. Seems to be pretty upbeat. Family sounds supportive. Will have Dr. Corina see pt at some point during admit  9/4- D/w pt and family today that he's very 'bright- but I'm expecting him to hit a wall when all this really hits.  9/11- mood remains positive 5. Neuropsych/cognition: This patient is capable of making decisions on his own behalf. 6.  Skin/Wound Care/large forehead laceration: staples removed 04/04/24.  Routine skin care.  9/8- incision on posterior neck puffy, draining, called Dr Darnella via NP 7. Fluids/Electrolytes/Nutrition: routine I&O, routine labs, continue vitamins/supplements (Ensure plus high protein BID, MVI)             -Regular diet 8. 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 Lester of [facility], continue ASA 81mg  daily 9. Superior endplate compression fx T5, T7, T8, T9, B/L upper thoracic posterior paraspinal muscle injury and edema, interspinous ligament injury T2-3 and possibly T3-4: no intervention needed. Per Dr. Darnella.  10. B/L apical pulmonary contusions, small pulmonary lac RLL: aggressive pulmonary toilet 11. L Ulnar styloid fx: NWB LUE, splint, f/up Dr. Murrell 9/4- when gets closer to 6 weeks, will call Dr Marjorie to go over when can WBAT 9/9- cannot see a surgery done? So 6 weeks from 8/14- called Dr Marjorie- ordered a wrist xray to f/u- and unlikely to be able to [facility] prior to d/c  12. Orthostatic hypotension: continue Midodrine  20mg  TID, Strattera  25mg  daily also started per Dr. Jyl recs with goal perhaps of coming off midodrine . -continue TEDs and abdominal binder -04/14/24 BPs improving, monitor 9/1-9/2 in general BP's soft, in 100s-110s. Supine, but doing better- con't regimen 9/3-9/4 BP's running low 90's to 110's- no concern about OH currently per therapy -9/6-7/25 BP occasionally elevated briefly but improves, no AD sxs, monitor 9/8- BP 131 systolic this AM! Sitting upright 9/11- BP soft. Continue to acclimate Vitals:   04/23/24 1300 04/23/24 1441 04/23/24 1829 04/23/24 1945  BP: (!) 157/92 126/83 109/74 104/76   04/24/24 0512 04/24/24 0700 04/24/24 0900 04/24/24 1206  BP:  99/68 (!) 156/92 120/79 114/77   04/24/24 1430 04/24/24 2013 04/25/24 0612 04/25/24 0800  BP: 126/88 93/64 93/60  108/69    13. Neurogenic bowel: on  bowel program: Dulcolax suppository 10mg  daily, Miralax  BID,  SenokotS 1 tab BID             -will adjust bowel program to 1800 daily             -senokot-s 2 tabs q am and miralax  BID  9/5- no BM last night and none with another bowel program this AM in spite of being done-- will give Sorbitol  60cc now and then SSE this evening with bowel program- ordered -04/21/24 small BM last night-- monitor 9/9- Will give 60cc Sorbitol  at 2pm and SE at 6pm at part of bowel program- cannot add Linzess since isn't insured- might need at home to take Mg citrate every few days- also taking a lot of Oxycodone  as well - hopefully this will decrease over time.  9/11 having response to bowel program but stool too soft  -hold miralax  bid and 3 senna-s's daily for now  -resume them in some form tomorrow 14. Neurogenic bladder: continue ISC q6h             -adjust I/O cath schedule to volumes of 300-500 cc -04/13/24 apparently used condom cath last night-- does feel urge to urinate, no bladder scans documented but pt says they were done but no ISC needed -Reviewed with nursing the plan for him, no condom cath for now, attempt with urinal q4-6h and bladder scan -ISC if scan >250cc 9/4- still not voiding with toileting 9/5- I still think he's kicking off, but not emptying- he has no feeling of voiding, just leaking-- same 9/6-7, no caths still, but incontinent 9/11 generally low pvr incontinent voids -will see what he does today. Consider bladder muscle relaxant with scheduled caths-  15. Polysubstance use disorder: UDS +cocaine, THC, Benzos-- provide counseling 16. 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 F/up outpatient.  17. ABLA: hgb stable 8-9 range, monitor twice weekly  -04/13/24 Hgb 9.6, monitor>> stable 9/4 at 10 18. Transaminitis: -04/13/24 Alk phos 286, AST 58, ALT 153-- could be from shock liver, monitor  again on Monday. Decrease tylenol  to 650mg  QID. No abd pain/n/v but consider imaging/work up if those develop.  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 - pt had BP yesterday of 166/92- pt admits had HA- so had AD- educated pt and family on AD- will continue to educate staff, because we were not called on the probable AD episode yesterday- 166/92 was BP- isolated spike in BP = AD esp in pt that has OH- went over with PA and staff about how to treat- sit pt up, loosen tight clothing, then if doesn't improve, cath him; do bowel program- that's 80% of all AD causes- if not improved, let PA/Doctor know 9/5- Had BP of 177/92 this AM- likely AD, although no HA- nurse did great catch and tried to do  bowel program- no results- will order another round of Sorbitol  60cc with SSE this evening to get him cleaned out- esp with constipation -04/20/24 occasionally elevated BP but improves quickly, no AD symptoms, monitor  9/9- had AD yesterday AM- no HA or nasal congestions but BP 160s/90s-  went through AD protocol- nursing did great- likely due to constipation 20. Spasticity-  9/5- will increase Baclofen  to 10 mg TID_ and first dose at 630am- d/w nursing and therapy that spasticity is progressing- once it starts, it's pretty fast initially- usually starts around 3-4 weeks and progresses, as I educated family about- and unfortunately we will try to keep up, but it's hard- he also has Zanaflex  2 mg TID AND 2 mg TID prn for additional spasms 9/8- has improved spasticity-  9/9- spasms much worse this AM- like didn't get meds, but per chart, received Baclofen  at 546 this  AM- will increase Zanaflex  to 4 mg TID- it's progressing so fast- not sure due to inflammation from mild incision infection- on keflex - can set it off 9/11 continue above schedule. Spasticity appears much improved to me compared to admit 21. 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 If so, will order SLP eval.  22. Wound abscess- in superficial tissues, not actual deep wound infection per Dr Darnella  9/9-11- NSU saw and put on keflex  for 7 days- will con't to monitor- is still draining- - per pt, Dr Darnella mentioned that if things weren't better at day 7 (9/15 at latest) that to cala him back.        LOS: 13 days A FACE TO FACE EVALUATION WAS PERFORMED  Tyrone Mckinney 04/25/2024, 9:49 AM

## 2024-04-25 NOTE — Progress Notes (Signed)
 Occupational Therapy Session Note  Patient Details  Name: Tyrone Mckinney MRN: [id number] Date of Birth: [birth year]  Today's Date: 04/25/2024 OT Individual Time: 9299-9184 OT Individual Time Calculation (min): 75 min    Short Term Goals: Week 2:  OT Short Term Goal 1 (Week 2): Pt will roll laterally in bed with Max A x1 to assist with ADL routine. OT Short Term Goal 2 (Week 2): Pt will bathe face with Mod A using compensatory techniques/strategies as needed. OT Short Term Goal 3 (Week 2): Pt will self-feed with Mod A using compensatory techniques/strategies as needed.  Skilled Therapeutic Interventions/Progress Updates:    Skilled OT intervention with focus on rolling in bed, sitting balance, grooming, RUE functional use, ongoing SCI education, and activity tolerance to increase independence with BADLs. Pt incontinent of bladder. Dependent to complete toileting tasks. Tot A for rolling in bed to facilitate toileting and donning pants. Dependent for donning Ted hose and shoes. Pt brushed teeth with max A using wrist splint/u-cuff and elbow supported. Pt remained in bed (chair position) with support for BUE. Sip n puff in place. RN notified of pt's status.   04/25/24 0817  SCIM SELF-CARE  Feeding (cutting, opening containers, pouring, bringing food to mouth, holding cup with fluid) 0  Bathing (soaping, washing, drying body and head, manipulating water tap) Upper Body;Lower Body  Bathing Upper Body 0  Bathing Lower Body 0  Dressing (clothes, shoes, permanent orthoses: dressing, wearing, undressing) Upper Body;Lower Body  Dressing Upper Body 0  Dressing Lower Body 0  Grooming (washing hands and face, brushing teeth, combing hair, shaving, applying makeup) 1  SCIM SELF-CARE SUBTOTAL ROW (0-20) 1    Therapy Documentation Precautions:  Precautions Precautions: Cervical, Fall Precaution/Restrictions Comments: MAP >65 Restrictions Weight Bearing Restrictions Per Provider Order: Yes LUE  Weight Bearing Per Provider Order: Partial weight bearing Other Position/Activity Restrictions: Forearm based thumb spica LUE (per ortho); WHO RUE, B PRAFOs Pain: Pt reports Rt shoulder/scaplula feeling better  Therapy/Group: Individual Therapy  [name] 04/25/2024, 8:18 AM

## 2024-04-25 NOTE — Progress Notes (Signed)
 Patient went outside the hospital with her daughter to get some fresh air. Patient was gone for 2 hours. Education provided that they can only be outside for 60 minutes. Patient verbalize understanding.

## 2024-04-25 NOTE — Progress Notes (Addendum)
 IP Rehab Bowel Program Documentation:    Bowel Program Start time 1800   Dig Stim Indicated? Yes  Dig Stim Prior to Suppository or mini Enema X 1    Output from dig stim: no output.   Ordered intervention: Suppository Yes , mini enema No ,    Repeat dig stim after Suppository or Mini enema x 0.   Output? No output    Bowel Program Complete? Yes    Patient Tolerated? Yes   Hand off given  to Night shift RN.

## 2024-04-25 NOTE — Plan of Care (Signed)
  Problem: Consults Goal: RH SPINAL CORD INJURY PATIENT EDUCATION Description:  See Patient Education module for education specifics.  Outcome: Progressing   Problem: SCI BOWEL ELIMINATION Goal: RH STG MANAGE BOWEL WITH ASSISTANCE Description: STG Manage Bowel with max Assistance. Outcome: Progressing   Problem: SCI BLADDER ELIMINATION Goal: RH STG MANAGE BLADDER WITH ASSISTANCE Description: STG Manage Bladder With max Assistance Outcome: Progressing   Problem: RH SKIN INTEGRITY Goal: RH STG SKIN FREE OF INFECTION/BREAKDOWN Description: Manage skin free of infection with max assistance Outcome: Progressing   Problem: RH SAFETY Goal: RH STG ADHERE TO SAFETY PRECAUTIONS W/ASSISTANCE/DEVICE Description: STG Adhere to Safety Precautions With max  Assistance/Device. Outcome: Progressing   Problem: RH PAIN MANAGEMENT Goal: RH STG PAIN MANAGED AT OR BELOW PT'S PAIN GOAL Description: <4 w/ prns Outcome: Progressing   Problem: RH KNOWLEDGE DEFICIT SCI Goal: RH STG INCREASE KNOWLEDGE OF SELF CARE AFTER SCI Description: Manage increase knowledge of self care after SCI with max assistance from spouse/ family using educational materials  provided.  Outcome: Progressing

## 2024-04-25 NOTE — Progress Notes (Signed)
 Physical Therapy Session Note  Patient Details  Name: Tyrone Mckinney MRN: [id number] Date of Birth: 11-Mar-1981  Today's Date: 04/25/2024 PT Individual Time: 8694-8581 PT Individual Time Calculation (min): 73 min   Short Term Goals: Week 2:  PT Short Term Goal 1 (Week 2): Pt will maintain static sitting with UE support with max a >30 sec PT Short Term Goal 2 (Week 2): Pt will recall and teach back pressure relief strategies PT Short Term Goal 3 (Week 2): Pt will be able to direct transfer with board with min cueing  Skilled Therapeutic Interventions/Progress Updates: Pt presented in bed with dgt and nsg present agreeable to therapy. Pt c/o pain in neck, received pain meds from nursing. Pt completed rolling L/R total A with pt able to hook RUE over bed rail with maxA for sling placement. Completed dependent transfer to TIS via Maxi move. Pt transported to main gym and completed dependent mechanical lift transfer to high/low mat. Pt then worked on sitting balance with PTA noting improved erect posture and was able to maintain head more upright. Pt worked on dorsal extension pushing into physioball with pt also working on maintaining upright head x5. Pt required max A but was able to find a position with RUE support on mat and was able to maintain light CGA for 36sec then 35 sec. During therapeutic rest PTA raised mat to work on LLE activities, PTA noting that pt was able to move RLE! Pt was able to complete kicking ball x 10 on RLE and x 10 LLE. Pt also performed lateral trunk leans onto bouncy ball x 5 requiring maxA to lean onto ball for shoulder approximation but was able to engage core to return to midline. Pt then completed dependent transfer back to TIS. Pt then transported to day room and was able to complete LLE on kinetron at 70cm/sec x 10. Pt continues to require assist with RLE due to lack of hamstring activation. Pt transported back to room and remained in TIS with dgt present and current  needs met.      Therapy Documentation Precautions:  Precautions Precautions: Cervical, Fall Precaution/Restrictions Comments: MAP >65 Restrictions Weight Bearing Restrictions Per Provider Order: Yes LUE Weight Bearing Per Provider Order: Partial weight bearing Other Position/Activity Restrictions: Forearm based thumb spica LUE (per ortho); WHO RUE, B PRAFOs General:   Vital Signs:   Pain: Pain Assessment Pain Scale: 0-10 Pain Score: 0-No pain Faces Pain Scale: No hurt Pain Location: Shoulder PAINAD (Pain Assessment in Advanced Dementia) Breathing: normal    Therapy/Group: Individual Therapy  [name] 04/25/2024, 4:16 PM

## 2024-04-26 MED ORDER — SENNOSIDES-DOCUSATE SODIUM 8.6-50 MG PO TABS
2.0000 | ORAL_TABLET | Freq: Every day | ORAL | Status: DC
  Administered 2024-04-26 – 2024-05-09 (×14): 2 via ORAL
  Filled 2024-04-26 (×14): qty 2

## 2024-04-26 MED ORDER — POLYETHYLENE GLYCOL 3350 17 G PO PACK
17.0000 g | PACK | Freq: Every day | ORAL | Status: DC
  Administered 2024-04-27 – 2024-05-14 (×16): 17 g via ORAL
  Filled 2024-04-26 (×18): qty 1

## 2024-04-26 NOTE — Progress Notes (Signed)
 Occupational Therapy Weekly Progress Note  Patient Details  Name: Tyrone Mckinney MRN: [id number] Date of Birth: 03-04-1981  Beginning of progress report period: April 19, 2024 End of progress report period: April 26, 2024  Patient has met 0 of 3 short term goals.  Although pt has made minimal progress with BADLs, he has demonstrated improvements with RUE elbow flexion and trace RUE flexion at shoulder girdle. Pt's RUE wrist/finger flexors/extensors have responded well to Saebo (e-stim) but pt has not been able to replicate. Pt currently requires tot A for rolling in bed. Pt is dependent for bathing/dressing at bed level. Pt can brush teeth with max A using wrist support and gravity eliminated. TB transfers with tot A+2. Sitting balance with tot A. Pt's family has been present for family conference but have not participated in education sessions.  Patient continues to demonstrate the following deficits: muscle weakness and muscle paralysis, decreased cardiorespiratoy endurance, impaired timing and sequencing, abnormal tone, unbalanced muscle activation, and decreased coordination, and decreased sitting balance, decreased standing balance, decreased postural control, and decreased balance strategies and therefore will continue to benefit from skilled OT intervention to enhance overall performance with BADL and Reduce care partner burden.  Patient progressing toward long term goals..  Continue plan of care.  OT Short Term Goals Week 2:  OT Short Term Goal 1 (Week 2): Pt will roll laterally in bed with Max A x1 to assist with ADL routine. OT Short Term Goal 1 - Progress (Week 2): Progressing toward goal OT Short Term Goal 2 (Week 2): Pt will bathe face with Mod A using compensatory techniques/strategies as needed. OT Short Term Goal 2 - Progress (Week 2): Progressing toward goal OT Short Term Goal 3 (Week 2): Pt will self-feed with Mod A using compensatory techniques/strategies as  needed. OT Short Term Goal 3 - Progress (Week 2): Progressing toward goal Week 3:  OT Short Term Goal 1 (Week 3): Pt will roll laterally in bed with Max A x1 to assist with ADL routine. OT Short Term Goal 2 (Week 3): Pt will self-feed with Mod A using compensatory techniques/strategies as needed. OT Short Term Goal 3 (Week 3): Pt will bathe face with Mod A using compensatory techniques/strategies as needed. OT Short Term Goal 4 (Week 3): Pt will maintain sitting balance EOB with max A in preparation for TB transfers    Therapy/Group: Individual Therapy  [name] 04/26/2024, 1:18 PM

## 2024-04-26 NOTE — Progress Notes (Signed)
 PROGRESS NOTE   Subjective/Complaints:  Pt without new issues today. Just finishing session with PT. Some dizziness when vertical, symptomatic sl at 40 degrees today. Pain controlled, spasms better  ROS: Patient denies fever, rash, sore throat, blurred vision, dizziness, nausea, vomiting, diarrhea, cough, shortness of breath or chest pain,  headache, or mood change.   Objective:   No results found.   Recent Labs    04/25/24 0448  WBC 4.9  HGB 10.0*  HCT 29.8*  PLT 389    Recent Labs    04/25/24 0448  NA 138  K 4.3  CL 99  CO2 26  GLUCOSE 108*  BUN 16  CREATININE 0.68  CALCIUM  9.4         Intake/Output Summary (Last 24 hours) at 04/26/2024 1013 Last data filed at 04/26/2024 0503 Gross per 24 hour  Intake 420 ml  Output 350 ml  Net 70 ml         Physical Exam: Vital Signs Blood pressure 98/66, pulse 64, temperature 98.6 F (37 C), resp. rate 18, height 6' 4 (1.93 m), weight 88.9 kg, SpO2 98%.    Constitutional: No distress . Vital signs reviewed. HEENT: NCAT, EOMI, oral membranes moist Neck: supple Cardiovascular: RRR without murmur. No JVD    Respiratory/Chest: CTA Bilaterally without wheezes or rales. Normal effort    GI/Abdomen: BS +, non-tender, non-distended Ext: no clubbing, cyanosis, or edema Psych: pleasant and cooperative  Skin: small amt of tanish/serous drainage from lower aspect of incision Neurological:     Mental Status: He is alert.     Comments: Alert and oriented x 3. Normal insight and awareness. Intact Memory. Normal language and speech. Cranial nerve exam unremarkable. MMT: RUE: delt 2+/5, biceps 3- to 3/5, triceps maybe trace now?, wrist and HI all 0/5.  LUE: delt 1/5, biceps 2/5, triceps, wrist, HI all 0/5. RLE: 1/5 HE and ~2/5 KE, 0/5 everywhere else. LLE: 2 to 2+/5 HF, HAD and KE, ADF/PF 2+/5. Sensory exam diminished below C5 on right and ~C6 on left. 1/2 sensation in  trunk and both LE, as well as inguinal/rectal area. Sensed sl more in LLE. DTR's 1+ in BUE and 3+ in BLE. Pt without resting LE tone. Perhaps a beat of clonus in either foot.    Assessment/Plan: 1. Functional deficits which require 3+ hours per day of interdisciplinary therapy in a comprehensive inpatient rehab setting. Physiatrist is providing close team supervision and 24 hour management of active medical problems listed below. Physiatrist and rehab team continue to assess barriers to discharge/monitor patient progress toward functional and medical goals  Care Tool:  Bathing        Body parts bathed by helper: Buttocks, Right upper leg, Left upper leg, Right lower leg, Left lower leg, Face, Right arm, Chest, Left arm, Abdomen, Front perineal area     Bathing assist Assist Level: Dependent - Patient 0%     Upper Body Dressing/Undressing Upper body dressing   What is the patient wearing?: Pull over shirt    Upper body assist Assist Level: Dependent - Patient 0%    Lower Body Dressing/Undressing Lower body dressing      What is the  patient wearing?: Incontinence brief     Lower body assist Assist for lower body dressing: Dependent - Patient 0%     Toileting Toileting    Toileting assist Assist for toileting: Dependent - Patient 0%     Transfers Chair/bed transfer  Transfers assist     Chair/bed transfer assist level: 2 Helpers     Locomotion Ambulation   Ambulation assist   Ambulation activity did not occur: Safety/medical concerns          Walk 10 feet activity   Assist  Walk 10 feet activity did not occur: Safety/medical concerns        Walk 50 feet activity   Assist Walk 50 feet with 2 turns activity did not occur: Safety/medical concerns         Walk 150 feet activity   Assist Walk 150 feet activity did not occur: Safety/medical concerns         Walk 10 feet on uneven surface  activity   Assist Walk 10 feet on uneven  surfaces activity did not occur: Safety/medical concerns         Wheelchair     Assist Is the patient using a wheelchair?: Yes Type of Wheelchair: Manual    Wheelchair assist level: Dependent - Patient 0% Max wheelchair distance: 150    Wheelchair 50 feet with 2 turns activity    Assist        Assist Level: Dependent - Patient 0%   Wheelchair 150 feet activity     Assist      Assist Level: Dependent - Patient 0%   Blood pressure 98/66, pulse 64, temperature 98.6 F (37 C), resp. rate 18, height 6' 4 (1.93 m), weight 88.9 kg, SpO2 98%.  Medical Problem List and Plan: 1. 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 Darnella of [facility] on 03/28/24:             -patient may not yet shower             -ELOS/Goals: 05/14/24.  mod to max assist goals             -No collar needed per Dr. Darnella             -Bilateral PRAFO's, R WHO.   -Continue CIR therapies including PT, OT. Pt is seeing motor gains in LE's.   Will switch PRAFOs to Prevalons due to spasticity -Continue CIR therapies including PT, OT  2.  Antithrombotics: -DVT/anticoagulation:  Pharmaceutical: Lovenox  30mg  BID 9/1- is (-) for DVT 9/4- needs Elqiuis or Lovenox  for a total of 3 months             -antiplatelet therapy: ASA 81mg  daily 3. Pain Management: Cymbalta  30mg  nightly, Gabapentin  600mg  TID, Tylenol  1000mg  q6h, Robaxin  1000mg  q8h, oxycodone  10-15mg  q4h prn,  -Consider increasing Cymbalta  to 60mg  nightly for improved pain control if needed -Pt developing early hypertonicity in LE's. Will begin scheduling tizanidine  at 2mg  tid to start. Can have 2mg  tid prn as well -04/13/24 decrease tylenol  650mg  QID d/t transminitis as below 9/1-  changed Cymbalta  to qday since had nightmares last night- didn't take trazodone . 9/3- still having nightmares, but not med related- might be PTSD? Vs subconscious dealing with his injury? 9/12- pain is improved.  Pt satsified 4. Mood/Behavior/Sleep: ego support             -antipsychotic agents: n/a             -Psych consult inpatient, no needs at that time -Pt will need emotional support. Seems to be pretty upbeat. Family sounds supportive. Will have Dr. Corina see pt at some point during admit  9/4- D/w pt and family today that he's very 'bright- but I'm expecting him to hit a wall when all this really hits.  9/12- mood remains positive 5. Neuropsych/cognition: This patient is capable of making decisions on his own behalf. 6. Skin/Wound Care/large forehead laceration: staples removed 04/04/24.  Routine skin care.  9/8- incision on posterior neck puffy, draining, called Dr Darnella via NP 7. Fluids/Electrolytes/Nutrition: routine I&O, routine labs, continue vitamins/supplements (Ensure plus high protein BID, MVI)             -Regular diet 8. 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 Lester of [facility], continue ASA 81mg  daily 9. Superior endplate compression fx T5, T7, T8, T9, B/L upper thoracic posterior paraspinal muscle injury and edema, interspinous ligament injury T2-3 and possibly T3-4: no intervention needed. Per Dr. Darnella.  10. B/L apical pulmonary contusions, small pulmonary lac RLL: aggressive pulmonary toilet 11. L Ulnar styloid fx: NWB LUE, splint, f/up Dr. Murrell 9/4- when gets closer to 6 weeks, will call Dr Marjorie to go over when can WBAT 9/9- cannot see a surgery done? So 6 weeks from 8/14- called Dr Marjorie- ordered a wrist xray to f/u- and unlikely to be able to [facility] prior to d/c  12. Orthostatic hypotension: continue Midodrine  20mg  TID, Strattera  25mg  daily also started per Dr. Jyl recs with goal perhaps of coming off midodrine . -continue TEDs and abdominal  binder -04/14/24 BPs improving, monitor 9/1-9/2 in general BP's soft, in 100s-110s. Supine, but doing better- con't regimen 9/3-9/4 BP's running low 90's to 110's- no concern about OH currently per therapy -9/6-7/25 BP occasionally elevated briefly but improves, no AD sxs, monitor 9/8- BP 131 systolic this AM! Sitting upright 9/12--bp remains very soft. Continue acclimation with PT Vitals:   04/23/24 1945 04/24/24 0512 04/24/24 0700 04/24/24 0900  BP: 104/76 99/68 (!) 156/92 120/79   04/24/24 1206 04/24/24 1430 04/24/24 2013 04/25/24 0612  BP: 114/77 126/88 93/64 93/60    04/25/24 0800 04/25/24 1722 04/25/24 2038 04/26/24 0442  BP: 108/69 124/77 102/74 98/66    13. Neurogenic bowel: on  bowel program: Dulcolax suppository 10mg  daily, Miralax  BID, SenokotS 1 tab BID             -will adjust bowel program to 1800 daily             -senokot-s 2 tabs q am and miralax  BID  9/5- no BM last night and none with another bowel program this AM in spite of being done-- will give Sorbitol  60cc now and then SSE this evening with bowel program- ordered -04/21/24 small BM last night-- monitor 9/9- Will give 60cc Sorbitol  at 2pm and SE at 6pm at part of bowel program- cannot add Linzess since isn't insured- might need at home to take Mg citrate every few days- also taking a lot of Oxycodone  as well - hopefully this will decrease over time.  9/11 having response to bowel program but stool too soft  -hold miralax  bid and 3 senna-s's daily for now  -resume them in some form tomorrow 9/12 formed stool last night, resume daily miralax   tomorrow and 2 senna-s tabs today 14. Neurogenic bladder: continue ISC q6h             -adjust I/O cath schedule to volumes of 300-500 cc -04/13/24 apparently used condom cath last night-- does feel urge to urinate, no bladder scans documented but pt says they were done but no ISC needed -Reviewed with nursing the plan for him, no condom cath for now, attempt with urinal q4-6h and  bladder scan -ISC if scan >250cc 9/4- still not voiding with toileting 9/5- I still think he's kicking off, but not emptying- he has no feeling of voiding, just leaking-- same 9/6-7, no caths still, but incontinent 9/12 pt has been incontinent with low PVR's. He insists that he can feel when he needs to empty and that staff can't get to him in time. He mentioned not wanting to bother nursing so much.  -will do timed voids q3-4 hours and obsv  -encouraged pt to notify staff when he needs to empty 15. Polysubstance use disorder: UDS +cocaine, THC, Benzos-- provide counseling 16. 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 F/up outpatient.  17. ABLA: hgb stable 8-9 range, monitor twice weekly  -04/13/24 Hgb 9.6, monitor>> stable 9/4 at 10 18. Transaminitis: -04/13/24 Alk phos 286, AST 58, ALT 153-- could be from shock liver, monitor again on Monday. Decrease tylenol  to 650mg  QID. No abd pain/n/v but consider imaging/work up if those develop.  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 - pt had BP yesterday of 166/92- pt admits had HA- so had AD- educated pt and family on AD- will continue to educate staff, because we were not called on the probable AD episode yesterday- 166/92 was BP- isolated spike in BP = AD esp in pt that has OH- went over with PA and staff about how to treat- sit pt up, loosen tight clothing, then if doesn't improve, cath him; do bowel program- that's 80% of all AD  causes- if not improved, let PA/Doctor know 9/5- Had BP of 177/92 this AM- likely AD, although no HA- nurse did great catch and tried to do bowel program- no results- will order another round of Sorbitol  60cc with SSE this evening to get him cleaned out- esp with constipation -04/20/24 occasionally elevated BP but improves quickly, no AD symptoms, monitor 9/9- had AD yesterday AM- no HA or nasal congestions but BP -9/12 no further AD. Having daily results with bowel program.    20. Spasticity-  9/5- will increase Baclofen  to 10 mg TID_ and first dose at 630am- d/w nursing and therapy that spasticity is progressing- once it starts, it's pretty fast initially- usually starts around 3-4 weeks and progresses, as I educated family about- and unfortunately we will try to keep up, but it's hard- he also has Zanaflex  2 mg TID AND 2 mg TID prn for additional spasms 9/8- has improved spasticity-  9/9- spasms much worse this AM- like didn't get meds, but per chart, received Baclofen  at 546 this AM-  will increase Zanaflex  to 4 mg TID- it's progressing so fast- not sure due to inflammation from mild incision infection- on keflex - can set it off 9/12 continue above schedule. Spasticity is MUCH improved compared to admit 21. 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 If so, will order SLP eval.  22. Wound abscess- in superficial tissues, not actual deep wound infection per Dr Darnella  9/9-11- NSU saw and put on keflex  for 7 days- will con't to monitor- is still draining- - per pt, Dr Darnella mentioned that if things weren't better at day 7 (9/15 at latest) that to cala him back.   9/12 wound/drainage appear to be improving--continue with above plan      LOS: 14 days A FACE TO FACE EVALUATION  WAS PERFORMED  Arthea ONEIDA Gunther 04/26/2024, 10:13 AM

## 2024-04-26 NOTE — Progress Notes (Signed)
 Physical Therapy Session Note  Patient Details  Name: Tyrone Mckinney MRN: [id number] Date of Birth: [birth year]  Today's Date: 04/26/2024 PT Individual Time: 0845-1000 PT Individual Time Calculation (min): 75 min   Short Term Goals: Week 2:  PT Short Term Goal 1 (Week 2): Pt will maintain static sitting with UE support with max a >30 sec PT Short Term Goal 2 (Week 2): Pt will recall and teach back pressure relief strategies PT Short Term Goal 3 (Week 2): Pt will be able to direct transfer with board with min cueing  Skilled Therapeutic Interventions/Progress Updates:    pt received in bed and agreeable to therapy. No complaint of pain. Session focused on transfer training and dynamic balance. Supine<>sit with tot +2 with tot a for seated balance EOB. Max x 2 for slideboard transfer x 4 during session. Emphasis on NMR for incr BLE involvement in transfer with improving ability with repetition. Once pt seated in TIS, assisted with doffing/donning shirt tot a for time. Pt able to assist with partially elevating BUE for washing underarms and applying Deoderant.   Transitioned to gym and to mat table as described above. NMR focused on creating lift during slideboard transfers and improving BLE involvement. Tot a for trunk support required but greatly improving LE involvement with dycem placed under feet. Pt participated in straight lifts and lateral scoots with max a for lift. Pt then returned to TIS and then to bed, remained up in chair position in [place] bed with needs in reach and sip and puff in place.   Therapy Documentation Precautions:  Precautions Precautions: Cervical, Fall Precaution/Restrictions Comments: MAP >65 Restrictions Weight Bearing Restrictions Per Provider Order: Yes LUE Weight Bearing Per Provider Order: Partial weight bearing Other Position/Activity Restrictions: Forearm based thumb spica LUE (per ortho); WHO RUE, B PRAFOs General:      Therapy/Group: Individual  Therapy  [name] 04/26/2024, 12:32 PM

## 2024-04-26 NOTE — Progress Notes (Signed)
 This nurse was about to insert suppository when small bowel movement was noted. Patient had small bowel movement prior to suppository being administered. He stated he didn't want the suppository related to bowel protocol being he had a small bowel movement.

## 2024-04-26 NOTE — Progress Notes (Signed)
 Occupational Therapy Session Note  Patient Details  Name: Tyrone Mckinney MRN: [id number] Date of Birth: 1980-12-30  Today's Date: 04/26/2024 OT Individual Time: 1449-1535 OT Individual Time Calculation (min): 46 min    Short Term Goals: Week 3:  OT Short Term Goal 1 (Week 3): Pt will roll laterally in bed with Max A x1 to assist with ADL routine. OT Short Term Goal 2 (Week 3): Pt will self-feed with Mod A using compensatory techniques/strategies as needed. OT Short Term Goal 3 (Week 3): Pt will bathe face with Mod A using compensatory techniques/strategies as needed. OT Short Term Goal 4 (Week 3): Pt will maintain sitting balance EOB with max A in preparation for TB transfers  Skilled Therapeutic Interventions/Progress Updates:     Pt received in KREG bed in seated position. Pt presenting to be in good spirits receptive to skilled OT session reporting 0/10 pain- OT offering intermittent rest breaks, repositioning, and therapeutic support to optimize participation in therapy session. Pt with incontinent void upon OT arrival. Pt overly apologetic regarding void in bed with OT providing therapeutic support and encouragement with education provided on importance of being able to direct his care and advocate for self regarding ADLs, transfers, and positioning with improvement in moral noted following. Pt able to express preferences for rolling techniques during peri-care and clean-up process with MIN questioning cues. Pt reporting increased comfort when rolling to L vs R and able to assist with reaching across midline to grab. MAX Ax2 required to complete bed mobility and toileting/dressing activities. Remainder of session focused on completing gentle U/LE stretching moving through full ROM to prevent onset of tone/spasticity. Increased comfort reported following. Pt able to bring hand to face and engage in washing face with MIN A for motor control. Donned B PRAFOs at end of session. Pt was left  resting in bed with call bell in reach, 4 bed rails up and all needs met.    Therapy Documentation Precautions:  Precautions Precautions: Cervical, Fall Precaution/Restrictions Comments: MAP >65 Restrictions Weight Bearing Restrictions Per Provider Order: Yes LUE Weight Bearing Per Provider Order: Partial weight bearing Other Position/Activity Restrictions: Forearm based thumb spica LUE (per ortho); WHO RUE, B PRAFOs   Therapy/Group: Individual Therapy  Katheryn SHAUNNA Mines 04/26/2024, 4:05 PM

## 2024-04-26 NOTE — Progress Notes (Signed)
 Occupational Therapy Session Note  Patient Details  Name: Tyrone Mckinney MRN: [id number] Date of Birth: 23-Aug-1980  Today's Date: 04/26/2024 OT Individual Time: 0700-0810 OT Individual Time Calculation (min): 70 min    Short Term Goals: Week 2:  OT Short Term Goal 1 (Week 2): Pt will roll laterally in bed with Max A x1 to assist with ADL routine. OT Short Term Goal 2 (Week 2): Pt will bathe face with Mod A using compensatory techniques/strategies as needed. OT Short Term Goal 3 (Week 2): Pt will self-feed with Mod A using compensatory techniques/strategies as needed.  Skilled Therapeutic Interventions/Progress Updates:    Pt resting in bed upon arrival with dtr present. Skilled OT intervention with focus on bed mobility, BUE AAROM/AROM/PROM, activity tolerance, and ongoing education to increase independence with BADLs. Rolling R/L in bed with tot A using bed rails. Pt able to hook RUE on bed rail to assist with maintaining sidelying position during personal care and pulling pants over hips. Pt with improved RUE elbow flexion and improved eccentric control. Pt amble to maintain elbow flexion at 90* with elbow supported. Trace RUE finger flexion. Trace LUE biceps but not able to move through ROM. Pt postioned in chair position in Sebastian bed with wast strap secured. Saebo placed on RUE wrist/finger flexors at end of session. Sip n puff in place.  PT removed Saebo when completed. No adverse reactions noted. Pt tolerated estim well.    04/26/24 0810  SCIM SELF-CARE  Feeding (cutting, opening containers, pouring, bringing food to mouth, holding cup with fluid) 0  Bathing (soaping, washing, drying body and head, manipulating water tap) Upper Body;Lower Body  Bathing Upper Body 0  Bathing Lower Body 0  Dressing (clothes, shoes, permanent orthoses: dressing, wearing, undressing) Upper Body;Lower Body  Dressing Upper Body 0  Dressing Lower Body 0  Grooming (washing hands and face, brushing  teeth, combing hair, shaving, applying makeup) 1  SCIM SELF-CARE SUBTOTAL ROW (0-20) 1    Therapy Documentation Precautions:  Precautions Precautions: Cervical, Fall Precaution/Restrictions Comments: MAP >65 Restrictions Weight Bearing Restrictions Per Provider Order: Yes LUE Weight Bearing Per Provider Order: Partial weight bearing Other Position/Activity Restrictions: Forearm based thumb spica LUE (per ortho); WHO RUE, B PRAFOs Pain: Pt reports he is ok this morning   Therapy/Group: Individual Therapy  [name] 04/26/2024, 8:12 AM

## 2024-04-27 NOTE — Progress Notes (Signed)
 Physical Therapy Session Note  Patient Details  Name: Tyrone Mckinney MRN: [id number] Date of Birth: [birth year]  Today's Date: 04/27/2024 PT Individual Time: 1420-1530  PT Individual Time Calculation (min): 70 min  Short Term Goals: Week 2:  PT Short Term Goal 1 (Week 2): Pt will maintain static sitting with UE support with max a >30 sec PT Short Term Goal 2 (Week 2): Pt will recall and teach back pressure relief strategies PT Short Term Goal 3 (Week 2): Pt will be able to direct transfer with board with min cueing  Skilled Therapeutic Interventions/Progress Updates:  Chart reviewed and pt agreeable to therapy. Pt received semi-reclined in bed with 0/10 c/o pain. Session focused on bed mobility, balance, and NMR to promote functional recovery for out of bed mobility. Pt initiated session with maxA + 2 transfer to EOB and maxA for seated balance. Pt noted to have drainage at wound site, so RN entered room for bandage change. Pt required maxA to balance in sitting t/o dressing change. Pt then completed trunk control exercises of forward and backwards leans with partial assist. Pt also able to maintain static seated balance using CGA for 10-15 secs per round. Pt then returned to bed using maxA +2. In bed, pr required maxA for teeth brushing per request. Pt then sat EOB for continued dressing management using maxA + 2. Pt then returned to supine position. In supine, pt completed BUE AAROM movements including shoulder flexion, abd, IR, ER, and elbow flexion all with 2-/5. Pt then completed BLE AAROM of hip fl and ext in gravity independent position, hip abd and add in hook lying position, and quad isometrics with gross 2-/5 in RLE and gross 2/5 in LLE.  At end of session, pt was left semi-reclined in bed with alarm engaged, nurse call bell and all needs in reach.     Therapy Documentation Precautions:  Precautions Precautions: Cervical, Fall Precaution/Restrictions Comments: MAP  >65 Restrictions Weight Bearing Restrictions Per Provider Order: Yes LUE Weight Bearing Per Provider Order: Partial weight bearing Other Position/Activity Restrictions: Forearm based thumb spica LUE (per ortho); WHO RUE, B PRAFOs General:      Therapy/Group: Individual Therapy   Warrick KANDICE Raspberry 04/27/2024, 3:51 PM

## 2024-04-27 NOTE — Progress Notes (Signed)
 IP Rehab Bowel Program Documentation   Bowel Program Start time 1620  Dig Stim Indicated? Yes  Dig Stim Prior to Suppository or mini Enema X 3   Output from dig stim: Small  Ordered intervention: Suppository Yes , mini enema No ,   Repeat dig stim after Suppository or Mini enema  X 3,  Output?    Bowel Program Complete?   , handoff given YES   Patient Tolerated? Yes

## 2024-04-27 NOTE — Progress Notes (Signed)
 PROGRESS NOTE   Subjective/Complaints:  Pt doing well, slept great, pain well managed, LBM last night, urinating ok still incontinent but not needing caths. No other complaints or concerns.   ROS: as per HPI. Denies CP, SOB, abd pain, N/V/D/C, or any other complaints at this time.    Objective:   No results found.   Recent Labs    04/25/24 0448  WBC 4.9  HGB 10.0*  HCT 29.8*  PLT 389    Recent Labs    04/25/24 0448  NA 138  K 4.3  CL 99  CO2 26  GLUCOSE 108*  BUN 16  CREATININE 0.68  CALCIUM  9.4         Intake/Output Summary (Last 24 hours) at 04/27/2024 1202 Last data filed at 04/27/2024 0900 Gross per 24 hour  Intake 120 ml  Output 508 ml  Net -388 ml         Physical Exam: Vital Signs Blood pressure 98/68, pulse (!) 58, temperature 97.6 F (36.4 C), temperature source Oral, resp. rate 19, height 6' 4 (1.93 m), weight 88.9 kg, SpO2 100%.    Constitutional: No distress . Vital signs reviewed. Resting in bed.  HEENT: NCAT, EOMI, oral membranes moist Neck: supple Cardiovascular: RRR without murmur. No JVD    Respiratory/Chest: CTA Bilaterally without wheezes or rales. Normal effort    GI/Abdomen: BS +, non-tender, non-distended, soft Ext: no clubbing, cyanosis, or edema Psych: pleasant and cooperative, upbeat  PRIOR EXAMS: Skin: small amt of tanish/serous drainage from lower aspect of incision Neurological:     Mental Status: He is alert.     Comments: Alert and oriented x 3. Normal insight and awareness. Intact Memory. Normal language and speech. Cranial nerve exam unremarkable. MMT: RUE: delt 2+/5, biceps 3- to 3/5, triceps maybe trace now?, wrist and HI all 0/5.  LUE: delt 1/5, biceps 2/5, triceps, wrist, HI all 0/5. RLE: 1/5 HE and ~2/5 KE, 0/5 everywhere else. LLE: 2 to 2+/5 HF, HAD and KE, ADF/PF 2+/5. Sensory exam diminished below C5 on right and ~C6 on left. 1/2 sensation in trunk  and both LE, as well as inguinal/rectal area. Sensed sl more in LLE. DTR's 1+ in BUE and 3+ in BLE. Pt without resting LE tone. Perhaps a beat of clonus in either foot.    Assessment/Plan: 1. Functional deficits which require 3+ hours per day of interdisciplinary therapy in a comprehensive inpatient rehab setting. Physiatrist is providing close team supervision and 24 hour management of active medical problems listed below. Physiatrist and rehab team continue to assess barriers to discharge/monitor patient progress toward functional and medical goals  Care Tool:  Bathing        Body parts bathed by helper: Buttocks, Right upper leg, Left upper leg, Right lower leg, Left lower leg, Face, Right arm, Chest, Left arm, Abdomen, Front perineal area     Bathing assist Assist Level: Dependent - Patient 0%     Upper Body Dressing/Undressing Upper body dressing   What is the patient wearing?: Pull over shirt    Upper body assist Assist Level: Dependent - Patient 0%    Lower Body Dressing/Undressing Lower body dressing  What is the patient wearing?: Incontinence brief     Lower body assist Assist for lower body dressing: Dependent - Patient 0%     Toileting Toileting    Toileting assist Assist for toileting: Dependent - Patient 0%     Transfers Chair/bed transfer  Transfers assist     Chair/bed transfer assist level: 2 Helpers     Locomotion Ambulation   Ambulation assist   Ambulation activity did not occur: Safety/medical concerns          Walk 10 feet activity   Assist  Walk 10 feet activity did not occur: Safety/medical concerns        Walk 50 feet activity   Assist Walk 50 feet with 2 turns activity did not occur: Safety/medical concerns         Walk 150 feet activity   Assist Walk 150 feet activity did not occur: Safety/medical concerns         Walk 10 feet on uneven surface  activity   Assist Walk 10 feet on uneven surfaces  activity did not occur: Safety/medical concerns         Wheelchair     Assist Is the patient using a wheelchair?: Yes Type of Wheelchair: Manual    Wheelchair assist level: Dependent - Patient 0% Max wheelchair distance: 150    Wheelchair 50 feet with 2 turns activity    Assist        Assist Level: Dependent - Patient 0%   Wheelchair 150 feet activity     Assist      Assist Level: Dependent - Patient 0%   Blood pressure 98/68, pulse (!) 58, temperature 97.6 F (36.4 C), temperature source Oral, resp. rate 19, height 6' 4 (1.93 m), weight 88.9 kg, SpO2 100%.  Medical Problem List and Plan: 1. 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 Darnella of [facility] on 03/28/24:             -patient may not yet shower             -ELOS/Goals: 05/14/24.  mod to max assist goals             -No collar needed per Dr. Darnella             -Bilateral PRAFO's, R WHO.   -Continue CIR therapies including PT, OT. Pt is seeing motor gains in LE's.   Will switch PRAFOs to Prevalons due to spasticity -Continue CIR therapies including PT, OT  2.  Antithrombotics: -DVT/anticoagulation:  Pharmaceutical: Lovenox  30mg  BID 9/1- is (-) for DVT 9/4- needs Elqiuis or Lovenox  for a total of 3 months             -antiplatelet therapy: ASA 81mg  daily 3. Pain Management: Cymbalta  30mg  nightly, Gabapentin  600mg  TID, Tylenol  1000mg  q6h, Robaxin  1000mg  q8h, oxycodone  10-15mg  q4h prn,  -Consider increasing Cymbalta  to 60mg  nightly for improved pain control if needed -Pt developing early hypertonicity in LE's. Will begin scheduling tizanidine  at 2mg  tid to start. Can have 2mg  tid prn as well -04/13/24 decrease tylenol  650mg  QID d/t  transminitis as below 9/1- changed Cymbalta  to qday since had nightmares last night- didn't take trazodone . 9/3- still having nightmares, but not med related- might be PTSD? Vs subconscious dealing  with his injury? 9/12- pain is improved. Pt satsified 4. Mood/Behavior/Sleep: ego support             -antipsychotic agents: n/a             -Psych consult inpatient, no needs at that time -Pt will need emotional support. Seems to be pretty upbeat. Family sounds supportive. Will have Dr. Corina see pt at some point during admit  9/4- D/w pt and family today that he's very 'bright- but I'm expecting him to hit a wall when all this really hits.  9/12- mood remains positive 5. Neuropsych/cognition: This patient is capable of making decisions on his own behalf. 6. Skin/Wound Care/large forehead laceration: staples removed 04/04/24.  Routine skin care.  9/8- incision on posterior neck puffy, draining, called Dr Darnella via NP 7. Fluids/Electrolytes/Nutrition: routine I&O, routine labs, continue vitamins/supplements (Ensure plus high protein BID, MVI)             -Regular diet 8. 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 Lester of [facility], continue ASA 81mg  daily 9. Superior endplate compression fx T5, T7, T8, T9, B/L upper thoracic posterior paraspinal muscle injury and edema, interspinous ligament injury T2-3 and possibly T3-4: no intervention needed. Per Dr. Darnella.  10. B/L apical pulmonary contusions, small pulmonary lac RLL: aggressive pulmonary toilet 11. L Ulnar styloid fx: NWB LUE, splint, f/up Dr. Murrell 9/4- when gets closer to 6 weeks, will call Dr Marjorie to go over when can WBAT 9/9- cannot see a surgery done? So 6 weeks from 8/14- called Dr Marjorie- ordered a wrist xray to f/u- and unlikely to be able to [facility] prior to d/c  12. Orthostatic hypotension: continue Midodrine  20mg  TID, Strattera  25mg  daily also started per Dr. Jyl recs with goal perhaps of coming off  midodrine . -continue TEDs and abdominal binder -04/14/24 BPs improving, monitor 9/1-9/2 in general BP's soft, in 100s-110s. Supine, but doing better- con't regimen 9/3-9/4 BP's running low 90's to 110's- no concern about OH currently per therapy -9/6-7/25 BP occasionally elevated briefly but improves, no AD sxs, monitor 9/8- BP 131 systolic this AM! Sitting upright 9/12--bp remains very soft. Continue acclimation with PT-- stable 9/13 Vitals:   04/24/24 0900 04/24/24 1206 04/24/24 1430 04/24/24 2013  BP: 120/79 114/77 126/88 93/64   04/25/24 0612 04/25/24 0800 04/25/24 1722 04/25/24 2038  BP: 93/60 108/69 124/77 102/74   04/26/24 0442 04/26/24 1348 04/26/24 1948 04/27/24 0510  BP: 98/66 135/79 108/73 98/68    13. Neurogenic bowel: on  bowel program: Dulcolax suppository 10mg  daily, Miralax  BID, SenokotS 1 tab BID             -will adjust bowel program to 1800 daily             -senokot-s 2 tabs q am and miralax  BID  9/5- no BM last night and none with another bowel program this AM in spite of being done-- will give Sorbitol  60cc now and then SSE this evening with bowel program- ordered -04/21/24 small BM last night-- monitor 9/9- Will give 60cc Sorbitol  at 2pm and SE at 6pm at part of bowel program- cannot add Linzess since isn't insured- might need at home to take Mg citrate every few days- also taking a lot of Oxycodone  as well - hopefully this will decrease over time.  9/11 having response to bowel program but stool too soft  -hold miralax  bid and 3 senna-s's daily for now  -resume them in some form tomorrow  9/12 formed stool last night, resume daily miralax  tomorrow and 2 senna-s tabs today -04/27/24 LBM last night, monitor 14. Neurogenic bladder: continue ISC q6h             -adjust I/O cath schedule to volumes of 300-500 cc -04/13/24 apparently used condom cath last night-- does feel urge to urinate, no bladder scans documented but pt says they were done but no ISC needed -Reviewed  with nursing the plan for him, no condom cath for now, attempt with urinal q4-6h and bladder scan -ISC if scan >250cc 9/4- still not voiding with toileting 9/5- I still think he's kicking off, but not emptying- he has no feeling of voiding, just leaking-- same 9/6-7, no caths still, but incontinent 9/12 pt has been incontinent with low PVR's. He insists that he can feel when he needs to empty and that staff can't get to him in time. He mentioned not wanting to bother nursing so much.  -will do timed voids q3-4 hours and obsv  -encouraged pt to notify staff when he needs to empty 15. Polysubstance use disorder: UDS +cocaine, THC, Benzos-- provide counseling 16. 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 F/up outpatient.  17. ABLA: hgb stable 8-9 range, monitor twice weekly  -04/13/24 Hgb 9.6, monitor>> stable 9/4 at 10 18. Transaminitis: -04/13/24 Alk phos 286, AST 58, ALT 153-- could be from shock liver, monitor again on Monday. Decrease tylenol  to 650mg  QID. No abd pain/n/v but consider imaging/work up if those develop.  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 - pt had BP yesterday of 166/92- pt admits had HA- so had AD- educated pt and family on AD- will continue to educate staff, because we were not called on the probable AD episode yesterday- 166/92 was BP- isolated spike in BP = AD esp in pt that has OH- went over with PA and staff about how to treat- sit pt up, loosen tight  clothing, then if doesn't improve, cath him; do bowel program- that's 80% of all AD causes- if not improved, let PA/Doctor know 9/5- Had BP of 177/92 this AM- likely AD, although no HA- nurse did great catch and tried to do bowel program- no results- will order another round of Sorbitol  60cc with SSE this evening to get him cleaned out- esp with constipation -04/20/24 occasionally elevated BP but improves quickly, no AD symptoms, monitor 9/9- had AD yesterday AM- no HA or nasal congestions but BP -9/12 no further AD. Having daily results with bowel program.    20. Spasticity-  9/5- will increase Baclofen  to 10 mg TID_ and first dose at 630am- d/w nursing and therapy that spasticity is progressing- once it starts, it's pretty fast initially- usually starts around 3-4 weeks and progresses, as I educated family about- and unfortunately we will try to keep up, but it's hard- he also has Zanaflex  2 mg TID AND 2 mg TID prn for additional spasms 9/8- has improved spasticity-  9/9- spasms much worse this AM-  like didn't get meds, but per chart, received Baclofen  at 546 this AM- will increase Zanaflex  to 4 mg TID- it's progressing so fast- not sure due to inflammation from mild incision infection- on keflex - can set it off 9/12 continue above schedule. Spasticity is MUCH improved compared to admit 21. 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 If so, will order SLP eval.  22. Wound abscess- in superficial tissues, not actual deep wound infection per Dr Darnella 9/9-11- NSU saw and put on keflex  for 7 days- will con't to monitor- is still draining- - per pt, Dr Darnella mentioned that if things weren't better at day 7 (9/15 at latest) that to cala him back.   9/12 wound/drainage appear to be  improving--continue with above plan      LOS: 15 days A FACE TO FACE EVALUATION WAS PERFORMED  [address] 04/27/2024, 12:02 PM

## 2024-04-28 NOTE — Progress Notes (Addendum)
 Bowel Program addendum-  2250- Patient with small soft bowel movement 0223-Patient with moderate soft bowel movement.  Condom cath switched to purewick for better collection as condom cath came off x2.   Pressure injury type wound noted to sacrum during skin inspection. Picture taken and documentation in flowsheets. Charge nurse made aware. Foam placed and continuing q2h turning

## 2024-04-28 NOTE — Progress Notes (Signed)
 PROGRESS NOTE   Subjective/Complaints:  Pt doing well again today, slept great, pain well managed, LBM last night, urinating ok still incontinent but still not needing caths. RN noting sacral wound overnight, stage 2. No other complaints or concerns. Bummed about the [place] game yesterday.   ROS: as per HPI. Denies CP, SOB, abd pain, N/V/D/C, or any other complaints at this time.    Objective:   No results found.   No results for input(s): WBC, HGB, HCT, PLT in the last 72 hours.   No results for input(s): NA, K, CL, CO2, GLUCOSE, BUN, CREATININE, CALCIUM  in the last 72 hours.        Intake/Output Summary (Last 24 hours) at 04/28/2024 1106 Last data filed at 04/28/2024 0840 Gross per 24 hour  Intake 240 ml  Output 1600 ml  Net -1360 ml         Physical Exam: Vital Signs Blood pressure (!) 89/61, pulse (!) 59, temperature 97.9 F (36.6 C), temperature source Oral, resp. rate 18, height 6' 4 (1.93 m), weight 88.9 kg, SpO2 100%.    Constitutional: No distress . Vital signs reviewed. In bed working with PT.   HEENT: NCAT, EOMI, oral membranes moist Neck: supple, incision covered.  Cardiovascular: RRR without murmur. No JVD    Respiratory/Chest: CTA Bilaterally without wheezes or rales. Normal effort    GI/Abdomen: BS +, non-tender, non-distended, soft Ext: no clubbing, cyanosis, or edema, L wrist brace donned. Psych: pleasant and cooperative, upbeat  PRIOR EXAMS: Skin: small amt of tanish/serous drainage from lower aspect of incision Neurological:     Mental Status: He is alert.     Comments: Alert and oriented x 3. Normal insight and awareness. Intact Memory. Normal language and speech. Cranial nerve exam unremarkable. MMT: RUE: delt 2+/5, biceps 3- to 3/5, triceps maybe trace now?, wrist and HI all 0/5.  LUE: delt 1/5, biceps 2/5, triceps, wrist, HI all 0/5. RLE: 1/5 HE and ~2/5 KE, 0/5  everywhere else. LLE: 2 to 2+/5 HF, HAD and KE, ADF/PF 2+/5. Sensory exam diminished below C5 on right and ~C6 on left. 1/2 sensation in trunk and both LE, as well as inguinal/rectal area. Sensed sl more in LLE. DTR's 1+ in BUE and 3+ in BLE. Pt without resting LE tone. Perhaps a beat of clonus in either foot.    Assessment/Plan: 1. Functional deficits which require 3+ hours per day of interdisciplinary therapy in a comprehensive inpatient rehab setting. Physiatrist is providing close team supervision and 24 hour management of active medical problems listed below. Physiatrist and rehab team continue to assess barriers to discharge/monitor patient progress toward functional and medical goals  Care Tool:  Bathing        Body parts bathed by helper: Buttocks, Right upper leg, Left upper leg, Right lower leg, Left lower leg, Face, Right arm, Chest, Left arm, Abdomen, Front perineal area     Bathing assist Assist Level: Dependent - Patient 0%     Upper Body Dressing/Undressing Upper body dressing   What is the patient wearing?: Pull over shirt    Upper body assist Assist Level: Dependent - Patient 0%    Lower Body Dressing/Undressing  Lower body dressing      What is the patient wearing?: Incontinence brief     Lower body assist Assist for lower body dressing: Dependent - Patient 0%     Toileting Toileting    Toileting assist Assist for toileting: Dependent - Patient 0%     Transfers Chair/bed transfer  Transfers assist     Chair/bed transfer assist level: 2 Helpers     Locomotion Ambulation   Ambulation assist   Ambulation activity did not occur: Safety/medical concerns          Walk 10 feet activity   Assist  Walk 10 feet activity did not occur: Safety/medical concerns        Walk 50 feet activity   Assist Walk 50 feet with 2 turns activity did not occur: Safety/medical concerns         Walk 150 feet activity   Assist Walk 150 feet  activity did not occur: Safety/medical concerns         Walk 10 feet on uneven surface  activity   Assist Walk 10 feet on uneven surfaces activity did not occur: Safety/medical concerns         Wheelchair     Assist Is the patient using a wheelchair?: Yes Type of Wheelchair: Manual    Wheelchair assist level: Dependent - Patient 0% Max wheelchair distance: 150    Wheelchair 50 feet with 2 turns activity    Assist        Assist Level: Dependent - Patient 0%   Wheelchair 150 feet activity     Assist      Assist Level: Dependent - Patient 0%   Blood pressure (!) 89/61, pulse (!) 59, temperature 97.9 F (36.6 C), temperature source Oral, resp. rate 18, height 6' 4 (1.93 m), weight 88.9 kg, SpO2 100%.  Medical Problem List and Plan: 1. 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 Darnella of [facility] on 03/28/24:             -patient may not yet shower             -ELOS/Goals: 05/14/24.  mod to max assist goals             -No collar needed per Dr. Darnella             -Bilateral PRAFO's, R WHO.   -Continue CIR therapies including PT, OT. Pt is seeing motor gains in LE's.   Will switch PRAFOs to Prevalons due to spasticity -Continue CIR therapies including PT, OT  2.  Antithrombotics: -DVT/anticoagulation:  Pharmaceutical: Lovenox  30mg  BID 9/1- is (-) for DVT 9/4- needs Elqiuis or Lovenox  for a total of 3 months             -antiplatelet therapy: ASA 81mg  daily 3. Pain Management: Cymbalta  30mg  nightly, Gabapentin  600mg  TID, Tylenol  1000mg  q6h, Robaxin  1000mg  q8h, oxycodone  10-15mg  q4h prn,  -Consider increasing Cymbalta  to 60mg  nightly for improved pain control if needed -Pt developing early  hypertonicity in LE's. Will begin scheduling tizanidine  at 2mg  tid to start. Can have 2mg  tid prn as well -04/13/24 decrease tylenol  650mg  QID d/t transminitis as below 9/1- changed Cymbalta  to qday since had nightmares last night- didn't take trazodone . 9/3- still having nightmares, but  not med related- might be PTSD? Vs subconscious dealing with his injury? 9/12- pain is improved. Pt satsified 4. Mood/Behavior/Sleep: ego support             -antipsychotic agents: n/a             -Psych consult inpatient, no needs at that time -Pt will need emotional support. Seems to be pretty upbeat. Family sounds supportive. Will have Dr. Corina see pt at some point during admit  9/4- D/w pt and family today that he's very 'bright- but I'm expecting him to hit a wall when all this really hits.  9/12- mood remains positive 5. Neuropsych/cognition: This patient is capable of making decisions on his own behalf. 6. Skin/Wound Care/large forehead laceration: staples removed 04/04/24.  Routine skin care.  9/8- incision on posterior neck puffy, draining, called Dr Darnella via NP 7. Fluids/Electrolytes/Nutrition: routine I&O, routine labs, continue vitamins/supplements (Ensure plus high protein BID, MVI)             -Regular diet 8. 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 Lester of [facility], continue ASA 81mg  daily 9. Superior endplate compression fx T5, T7, T8, T9, B/L upper thoracic posterior paraspinal muscle injury and edema, interspinous ligament injury T2-3 and possibly T3-4: no intervention needed. Per Dr. Darnella.  10. B/L apical pulmonary contusions, small pulmonary lac RLL: aggressive pulmonary toilet 11. L Ulnar styloid fx: NWB LUE, splint, f/up Dr. Murrell 9/4- when gets closer to 6 weeks, will call Dr Marjorie to go over when can WBAT 9/9- cannot see a surgery done? So 6 weeks from 8/14- called Dr Marjorie- ordered a wrist xray to f/u- and unlikely to be able to [facility] prior to d/c  12. Orthostatic  hypotension: continue Midodrine  20mg  TID, Strattera  25mg  daily also started per Dr. Jyl recs with goal perhaps of coming off midodrine . -continue TEDs and abdominal binder -04/14/24 BPs improving, monitor 9/1-9/2 in general BP's soft, in 100s-110s. Supine, but doing better- con't regimen 9/3-9/4 BP's running low 90's to 110's- no concern about OH currently per therapy -9/6-7/25 BP occasionally elevated briefly but improves, no AD sxs, monitor 9/8- BP 131 systolic this AM! Sitting upright 9/12--bp remains very soft. Continue acclimation with PT-- stable 9/13 Vitals:   04/25/24 0612 04/25/24 0800 04/25/24 1722 04/25/24 2038  BP: 93/60 108/69 124/77 102/74   04/26/24 0442 04/26/24 1348 04/26/24 1948 04/27/24 0510  BP: 98/66 135/79 108/73 98/68   04/27/24 1553 04/27/24 1925 04/28/24 0448 04/28/24 0545  BP: (!) 150/96 110/77 (!) 85/62 (!) 89/61    13. Neurogenic bowel: on  bowel program: Dulcolax suppository 10mg  daily, Miralax  BID, SenokotS 1 tab BID             -will adjust bowel program to 1800 daily             -senokot-s 2 tabs q am and miralax  BID  9/5- no BM last night and none with another bowel program this AM in spite of being done-- will give Sorbitol  60cc now and then SSE this evening with bowel program- ordered -04/21/24 small BM last night-- monitor 9/9- Will give 60cc Sorbitol  at 2pm and SE at 6pm at part of bowel program- cannot add Linzess since isn't insured- might need at home to take Mg citrate every few days- also taking a lot of Oxycodone  as well - hopefully this will decrease over time.  9/11 having response to bowel program but stool too soft  -hold miralax  bid and  3 senna-s's daily for now  -resume them in some form tomorrow 9/12 formed stool last night, resume daily miralax  tomorrow and 2 senna-s tabs today -04/28/24 LBM last night, monitor 14. Neurogenic bladder: continue ISC q6h             -adjust I/O cath schedule to volumes of 300-500 cc -04/13/24 apparently  used condom cath last night-- does feel urge to urinate, no bladder scans documented but pt says they were done but no ISC needed -Reviewed with nursing the plan for him, no condom cath for now, attempt with urinal q4-6h and bladder scan -ISC if scan >250cc 9/4- still not voiding with toileting 9/5- I still think he's kicking off, but not emptying- he has no feeling of voiding, just leaking-- same 9/6-7, no caths still, but incontinent 9/12 pt has been incontinent with low PVR's. He insists that he can feel when he needs to empty and that staff can't get to him in time. He mentioned not wanting to bother nursing so much.  -will do timed voids q3-4 hours and obsv  -encouraged pt to notify staff when he needs to empty 15. Polysubstance use disorder: UDS +cocaine, THC, Benzos-- provide counseling 16. 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 F/up outpatient.  17. ABLA: hgb stable 8-9 range, monitor twice weekly  -04/13/24 Hgb 9.6, monitor>> stable 9/4 at 10 18. Transaminitis: -04/13/24 Alk phos 286, AST 58, ALT 153-- could be from shock liver, monitor again on Monday. Decrease tylenol  to 650mg  QID. No abd pain/n/v but consider imaging/work up if those develop.  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 - pt had BP yesterday of 166/92- pt admits had HA- so had AD- educated pt and family on AD- will continue to educate staff, because we were not called on the probable AD episode  yesterday- 166/92 was BP- isolated spike in BP = AD esp in pt that has OH- went over with PA and staff about how to treat- sit pt up, loosen tight clothing, then if doesn't improve, cath him; do bowel program- that's 80% of all AD causes- if not improved, let PA/Doctor know 9/5- Had BP of 177/92 this AM- likely AD, although no HA- nurse did great catch and tried to do bowel program- no results- will order another round of Sorbitol  60cc with SSE this evening to get him cleaned out- esp with constipation -04/20/24 occasionally elevated BP but improves quickly, no AD symptoms, monitor 9/9- had AD yesterday AM- no HA or nasal congestions but BP -9/12 no further AD. Having daily results with bowel program.    20. Spasticity-  9/5- will increase Baclofen  to 10 mg TID_ and first dose at 630am- d/w nursing and therapy that spasticity is progressing- once it starts, it's pretty fast initially- usually starts around 3-4 weeks and progresses, as I educated family about- and unfortunately we will try to keep up, but it's hard- he also has Zanaflex  2 mg TID AND 2 mg TID prn for additional  spasms 9/8- has improved spasticity-  9/9- spasms much worse this AM- like didn't get meds, but per chart, received Baclofen  at 546 this AM- will increase Zanaflex  to 4 mg TID- it's progressing so fast- not sure due to inflammation from mild incision infection- on keflex - can set it off 9/12 continue above schedule. Spasticity is MUCH improved compared to admit 21. 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 If so, will order SLP eval.  22. Wound abscess- in superficial tissues, not actual deep wound infection per Dr Darnella 9/9-11- NSU saw and put on keflex  for 7 days- will con't to monitor- is still draining-  - per pt, Dr Darnella mentioned that if things weren't better at day 7 (9/15 at latest) that to call him back.   9/12 wound/drainage appear to be improving--continue with above plan      LOS: 16 days A FACE TO FACE EVALUATION WAS PERFORMED  [address] 04/28/2024, 11:06 AM

## 2024-04-28 NOTE — Progress Notes (Signed)
 Physical Therapy Session Note  Patient Details  Name: Tyrone Mckinney MRN: [id number] Date of Birth: [birth year]  Today's Date: 04/28/2024 PT Individual Time: 0915-1020 PT Individual Time Calculation (min): 65 min   Short Term Goals: Week 2:  PT Short Term Goal 1 (Week 2): Pt will maintain static sitting with UE support with max a >30 sec PT Short Term Goal 2 (Week 2): Pt will recall and teach back pressure relief strategies PT Short Term Goal 3 (Week 2): Pt will be able to direct transfer with board with min cueing  Skilled Therapeutic Interventions/Progress Updates: Pt presented in bed agreeable to therapy. Pt states increased pain in neck, premedicated. Session focused on tile table tolerance. Pt set up in Kregg bed with BP monitored as noted below.   Supine: 119/83 (95) HR 57 20 degrees 86/64 (71) HR 64 20 degrees after ~5 min 111/80 (91) HR 60 30 degrees 89/67 (75) HR 70 30 degrees ~5 min 101/69 (81) HR 67 35 degrees 96/71 (80) HR 62 35 degrees ~3 min 104/79 (88) HR 60 42 degrees 100/69 (79) HR 62 45 degrees 85/66 (73) HR 66 - mild symptoms 45 degrees ~3 min 89/72 (79) HR 67   Upon return to supine pt indicated increased symptoms and nausea. Pt began dry heaving but no emesis. After a few minutes pt states feeling better and agreeable to transfer to TIS. Pt performed rolling total A L/R for sling placement. Pt completed dependent transfer to TIS. In TIS U cuff applied to R wrist and pt attempted to brush teeth with maxA. Due to toothbrush's width popped out of cuff therefore PTA completed oral care dependently. Pt left in TIS at end of session and left with sip and puff in place and current needs met.      Therapy Documentation Precautions:  Precautions Precautions: Cervical, Fall Precaution/Restrictions Comments: MAP >65 Restrictions Weight Bearing Restrictions Per Provider Order: No LUE Weight Bearing Per Provider Order: Partial weight bearing Other Position/Activity  Restrictions: Forearm based thumb spica LUE (per ortho); WHO RUE, B PRAFOs General:   Vital Signs: Therapy Vitals Temp: 97.9 F (36.6 C) Pulse Rate: (!) 54 Resp: 17 BP: 113/73 Patient Position (if appropriate): Sitting Oxygen Therapy SpO2: 100 % O2 Device: Room Air Pain: Pain Assessment Pain Scale: 0-10 Pain Score: 4  Pain Location: Shoulder Pain Intervention(s): Medication (See eMAR)    Therapy/Group: Individual Therapy  Malena Timpone 04/28/2024, 4:35 PM

## 2024-04-28 NOTE — Progress Notes (Signed)
 IP Rehab Bowel Program Documentation   Bowel Program Start time [phone]  Dig Stim Indicated? Yes  Dig Stim Prior to Suppository or mini Enema X 3   Output from dig stim: Moderate  Ordered intervention: Suppository Yes , mini enema No ,   Repeat dig stim after Suppository or Mini enema  X 3,  Output? Small   Bowel Program Complete? Yes , handoff given Yes   Patient Tolerated? Yes   Nat Hacker LPN

## 2024-04-29 DIAGNOSIS — F430 Acute stress reaction: Secondary | ICD-10-CM

## 2024-04-29 LAB — COMPREHENSIVE METABOLIC PANEL WITH GFR
ALT: 95 U/L — ABNORMAL HIGH (ref 0–44)
AST: 42 U/L — ABNORMAL HIGH (ref 15–41)
Albumin: 3 g/dL — ABNORMAL LOW (ref 3.5–5.0)
Alkaline Phosphatase: 197 U/L — ABNORMAL HIGH (ref 38–126)
Anion gap: 10 (ref 5–15)
BUN: 22 mg/dL — ABNORMAL HIGH (ref 6–20)
CO2: 28 mmol/L (ref 22–32)
Calcium: 9.3 mg/dL (ref 8.9–10.3)
Chloride: 101 mmol/L (ref 98–111)
Creatinine, Ser: 0.68 mg/dL (ref 0.61–1.24)
GFR, Estimated: >60 mL/min
Glucose, Bld: 107 mg/dL — ABNORMAL HIGH (ref 70–99)
Potassium: 4.5 mmol/L (ref 3.5–5.1)
Sodium: 139 mmol/L (ref 135–145)
Total Bilirubin: 0.5 mg/dL (ref 0.0–1.2)
Total Protein: 7 g/dL (ref 6.5–8.1)

## 2024-04-29 LAB — CBC
HCT: 32 % — ABNORMAL LOW (ref 39.0–52.0)
Hemoglobin: 10.6 g/dL — ABNORMAL LOW (ref 13.0–17.0)
MCH: 30.1 pg (ref 26.0–34.0)
MCHC: 33.1 g/dL (ref 30.0–36.0)
MCV: 90.9 fL (ref 80.0–100.0)
Platelets: 357 K/uL (ref 150–400)
RBC: 3.52 MIL/uL — ABNORMAL LOW (ref 4.22–5.81)
RDW: 12.6 % (ref 11.5–15.5)
WBC: 4.8 K/uL (ref 4.0–10.5)
nRBC: 0 % (ref 0.0–0.2)

## 2024-04-29 MED ORDER — ZINC SULFATE 220 (50 ZN) MG PO CAPS
220.0000 mg | ORAL_CAPSULE | Freq: Every day | ORAL | Status: DC
  Administered 2024-04-29 – 2024-05-14 (×16): 220 mg via ORAL
  Filled 2024-04-29 (×16): qty 1

## 2024-04-29 MED ORDER — MEDIHONEY WOUND/BURN DRESSING EX PSTE
1.0000 | PASTE | Freq: Every day | CUTANEOUS | Status: DC
  Administered 2024-04-29 – 2024-05-14 (×16): 1 via TOPICAL
  Filled 2024-04-29 (×2): qty 44

## 2024-04-29 NOTE — Plan of Care (Signed)
  Problem: Consults Goal: RH SPINAL CORD INJURY PATIENT EDUCATION Description:  See Patient Education module for education specifics.  Outcome: Progressing   Problem: SCI BOWEL ELIMINATION Goal: RH STG MANAGE BOWEL WITH ASSISTANCE Description: STG Manage Bowel with max Assistance. Outcome: Progressing   Problem: SCI BLADDER ELIMINATION Goal: RH STG MANAGE BLADDER WITH ASSISTANCE Description: STG Manage Bladder With max Assistance Outcome: Progressing   Problem: RH SKIN INTEGRITY Goal: RH STG SKIN FREE OF INFECTION/BREAKDOWN Description: Manage skin free of infection with max assistance Outcome: Progressing   Problem: RH SAFETY Goal: RH STG ADHERE TO SAFETY PRECAUTIONS W/ASSISTANCE/DEVICE Description: STG Adhere to Safety Precautions With max  Assistance/Device. Outcome: Progressing   Problem: RH PAIN MANAGEMENT Goal: RH STG PAIN MANAGED AT OR BELOW PT'S PAIN GOAL Description: <4 w/ prns Outcome: Progressing

## 2024-04-29 NOTE — Progress Notes (Signed)
 Patient had 1 large incontinent bowel movement this shift at 0100 during repositioning. Purewick replaced x1 due to leakage.  Bladder scan results this shift ( and ). Foam remains in place over sacrum. Prevalon boots in place.

## 2024-04-29 NOTE — Progress Notes (Signed)
 PROGRESS NOTE   Subjective/Complaints:  Pt without complaints. Watched a bunch of football this weekend! Denies pain  ROS: Patient denies fever, rash, sore throat, blurred vision, dizziness, nausea, vomiting, diarrhea, cough, shortness of breath or chest pain, joint or back/neck pain, headache, or mood change.   Objective:   No results found.   Recent Labs    04/29/24 0559  WBC 4.8  HGB 10.6*  HCT 32.0*  PLT 357     Recent Labs    04/29/24 0559  NA 139  K 4.5  CL 101  CO2 28  GLUCOSE 107*  BUN 22*  CREATININE 0.68  CALCIUM  9.3          Intake/Output Summary (Last 24 hours) at 04/29/2024 1326 Last data filed at 04/29/2024 1006 Gross per 24 hour  Intake 480 ml  Output 450 ml  Net 30 ml         Physical Exam: Vital Signs Blood pressure 109/76, pulse 60, temperature 98.2 F (36.8 C), temperature source Oral, resp. rate 16, height 6' 4 (1.93 m), weight 88.9 kg, SpO2 100%.    Constitutional: No distress . Vital signs reviewed. HEENT: NCAT, EOMI, oral membranes moist Neck: supple Cardiovascular: RRR without murmur. No JVD    Respiratory/Chest: CTA Bilaterally without wheezes or rales. Normal effort    GI/Abdomen: BS +, non-tender, non-distended Ext: no clubbing, cyanosis, or edema Psych: pleasant and cooperative  Skin: minimal drainage from lower aspect of incision, sacral wound dressed.  Neurological:     Mental Status: He is alert.     Comments: Alert and oriented x 3. Normal insight and awareness. Intact Memory. Normal language and speech. Cranial nerve exam unremarkable. MMT: RUE: delt 2+/5, biceps 3- to 3/5, triceps maybe trace, wrist and HI all 0/5.  LUE: delt 1/5, biceps 2/5, triceps, wrist, HI all 0/5. RLE: 1/5 HE and ~1+/5 KE, 0/5 everywhere else. LLE: 2 to 2+/5 HF, HAD and KE, ADF/PF 2+/5. Sensory exam diminished below C5 on right and ~C6 on left. 1/2 sensation in trunk and both LE, as  well as inguinal/rectal area. Sensed sl more in LLE. DTR's 1+ in BUE and 3+ in BLE. Pt without resting LE tone. 1-2  beats of clonus in either foot.    Assessment/Plan: 1. Functional deficits which require 3+ hours per day of interdisciplinary therapy in a comprehensive inpatient rehab setting. Physiatrist is providing close team supervision and 24 hour management of active medical problems listed below. Physiatrist and rehab team continue to assess barriers to discharge/monitor patient progress toward functional and medical goals  Care Tool:  Bathing        Body parts bathed by helper: Buttocks, Right upper leg, Left upper leg, Right lower leg, Left lower leg, Face, Right arm, Chest, Left arm, Abdomen, Front perineal area     Bathing assist Assist Level: Dependent - Patient 0%     Upper Body Dressing/Undressing Upper body dressing   What is the patient wearing?: Pull over shirt    Upper body assist Assist Level: Dependent - Patient 0%    Lower Body Dressing/Undressing Lower body dressing      What is the patient wearing?: Incontinence brief  Lower body assist Assist for lower body dressing: Dependent - Patient 0%     Toileting Toileting    Toileting assist Assist for toileting: Dependent - Patient 0%     Transfers Chair/bed transfer  Transfers assist     Chair/bed transfer assist level: 2 Helpers     Locomotion Ambulation   Ambulation assist   Ambulation activity did not occur: Safety/medical concerns          Walk 10 feet activity   Assist  Walk 10 feet activity did not occur: Safety/medical concerns        Walk 50 feet activity   Assist Walk 50 feet with 2 turns activity did not occur: Safety/medical concerns         Walk 150 feet activity   Assist Walk 150 feet activity did not occur: Safety/medical concerns         Walk 10 feet on uneven surface  activity   Assist Walk 10 feet on uneven surfaces activity did not  occur: Safety/medical concerns         Wheelchair     Assist Is the patient using a wheelchair?: Yes Type of Wheelchair: Manual    Wheelchair assist level: Dependent - Patient 0% Max wheelchair distance: 150    Wheelchair 50 feet with 2 turns activity    Assist        Assist Level: Dependent - Patient 0%   Wheelchair 150 feet activity     Assist      Assist Level: Dependent - Patient 0%   Blood pressure 109/76, pulse 60, temperature 98.2 F (36.8 C), temperature source Oral, resp. rate 16, height 6' 4 (1.93 m), weight 88.9 kg, SpO2 100%.  Medical Problem List and Plan: 1. 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 Darnella of [facility] on 03/28/24:             -patient may not yet shower             -ELOS/Goals: 05/14/24.  mod to max assist goals             -No collar needed per Dr. Darnella             -Bilateral PRAFO's, R WHO.  -Will switch PRAFOs to Prevalons due to spasticity -Continue CIR therapies including PT, OT  2.  Antithrombotics: -DVT/anticoagulation:  Pharmaceutical: Lovenox  30mg  BID 9/1- is (-) for DVT 9/4- needs Elqiuis or Lovenox  for a total of 3 months             -antiplatelet therapy: ASA 81mg  daily 3. Pain Management: Cymbalta  30mg  nightly, Gabapentin  600mg  TID, Tylenol  1000mg  q6h, Robaxin  1000mg  q8h, oxycodone  10-15mg  q4h prn,  -Consider increasing Cymbalta  to 60mg  nightly for improved pain control if needed -Pt developing early hypertonicity in LE's. Will begin scheduling tizanidine  at 2mg  tid to start. Can have 2mg  tid prn as well -04/13/24 decrease tylenol  650mg  QID d/t transminitis as below 9/1- changed Cymbalta  to qday since had nightmares last night- didn't take  trazodone . 9/3- still having nightmares, but not med related- might be PTSD? Vs subconscious dealing with his injury? 9/15- pain is improved. Pt satsified 4. Mood/Behavior/Sleep: ego support             -antipsychotic agents: n/a             -  Psych consult inpatient, no needs at that time -Pt will need emotional support. Seems to be pretty upbeat. Family sounds supportive. Will have Dr. Corina see pt at some point during admit  9/4- D/w pt and family today that he's very 'bright- but I'm expecting him to hit a wall when all this really hits.  9/12- mood remains positive 5. Neuropsych/cognition: This patient is capable of making decisions on his own behalf. 6. Skin/Wound Care/large forehead laceration: staples removed 04/04/24.  Routine skin care.  9/8- incision on posterior neck puffy, draining, called Dr Darnella via NP 7. Fluids/Electrolytes/Nutrition: routine I&O, routine labs, continue vitamins/supplements (Ensure plus high protein BID, MVI)             -Regular diet 8. 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 Lester of [facility], continue ASA 81mg  daily 9. Superior endplate compression fx T5, T7, T8, T9, B/L upper thoracic posterior paraspinal muscle injury and edema, interspinous ligament injury T2-3 and possibly T3-4: no intervention needed. Per Dr. Darnella.  10. B/L apical pulmonary contusions, small pulmonary lac RLL: aggressive pulmonary toilet 11. L Ulnar styloid fx: NWB LUE, splint, f/up Dr. Murrell 9/4- when gets closer to 6 weeks, will call Dr Marjorie to go over when can WBAT 9/9- cannot see a surgery done? So 6 weeks from 8/14- called Dr Marjorie- ordered a wrist xray to f/u- and unlikely to be able to [facility] prior to d/c  12. Orthostatic hypotension: continue Midodrine  20mg  TID, Strattera  25mg  daily also started per Dr. Jyl recs with goal perhaps of coming off midodrine . -continue TEDs and abdominal binder -04/14/24 BPs improving, monitor 9/1-9/2 in general BP's soft, in  100s-110s. Supine, but doing better- con't regimen 9/3-9/4 BP's running low 90's to 110's- no concern about OH currently per therapy -9/6-7/25 BP occasionally elevated briefly but improves, no AD sxs, monitor 9/8- BP 131 systolic this AM! Sitting upright 9/15--bp remains very soft. Continue acclimation with PT Vitals:   04/26/24 1948 04/27/24 0510 04/27/24 1553 04/27/24 1925  BP: 108/73 98/68 (!) 150/96 110/77   04/28/24 0448 04/28/24 0448 04/28/24 0543 04/28/24 0545  BP: (!) 85/62 (!) 85/62 (!) 89/61 (!) 89/61   04/28/24 1512 04/28/24 2008 04/29/24 0301 04/29/24 1200  BP: 113/73 104/76 (!) 98/56 109/76    13. Neurogenic bowel: on  bowel program: Dulcolax suppository 10mg  daily, Miralax  BID, SenokotS 1 tab BID             -will adjust bowel program to 1800 daily             -senokot-s 2 tabs q am and miralax  BID  9/5- no BM last night and none with another bowel program this AM in spite of being done-- will give Sorbitol  60cc now and then SSE this evening with bowel program- ordered -04/21/24 small BM last night-- monitor 9/9- Will give 60cc Sorbitol  at 2pm and SE at 6pm at part of bowel program- cannot add Linzess since isn't insured- might need at home to take Mg citrate every few days- also taking a lot of Oxycodone  as well - hopefully this will decrease over time.  9/11 having response to bowel program but stool too soft  -hold miralax  bid and 3 senna-s's daily for now  -resume them in some form tomorrow 9/12 formed stool last night, resume daily miralax  tomorrow and 2 senna-s tabs today -9/15 had a large T6 bm at 0100 this am 14. Neurogenic bladder: continue ISC q6h             -  adjust I/O cath schedule to volumes of 300-500 cc -04/13/24 apparently used condom cath last night-- does feel urge to urinate, no bladder scans documented but pt says they were done but no ISC needed -Reviewed with nursing the plan for him, no condom cath for now, attempt with urinal q4-6h and bladder  scan -ISC if scan >250cc 9/4- still not voiding with toileting 9/5- I still think he's kicking off, but not emptying- he has no feeling of voiding, just leaking-- same 9/6-7, no caths still, but incontinent 9/12-15 pt has been incontinent with low to medium PVR's. He insists that he can feel when he needs to empty and that staff can't get to him in time. He has mentioned not wanting to bother nursing so much.  -continue timed voids q3-4 hours and obsv  -encouraged pt to notify staff when he needs to empty -continue voiding trial/education 15. Polysubstance use disorder: UDS +cocaine, THC, Benzos-- provide counseling 16. 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 F/up outpatient.  17. ABLA: hgb stable 8-9 range, monitor twice weekly  -04/13/24 Hgb 9.6, monitor>> stable 9/4 at 10 18. Transaminitis: -04/13/24 Alk phos 286, AST 58, ALT 153-- could be from shock liver, monitor again on Monday. Decrease tylenol  to 650mg  QID. No abd pain/n/v but consider imaging/work up if those develop.  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 - pt had BP yesterday of 166/92- pt admits had HA- so had AD- educated pt and family on AD- will continue to educate staff, because we were not called on the probable AD episode yesterday- 166/92 was BP- isolated spike in BP = AD esp in pt that has OH- went over with PA and staff about how to treat- sit pt up, loosen tight clothing, then if doesn't improve, cath  him; do bowel program- that's 80% of all AD causes- if not improved, let PA/Doctor know 9/5- Had BP of 177/92 this AM- likely AD, although no HA- nurse did great catch and tried to do bowel program- no results- will order another round of Sorbitol  60cc with SSE this evening to get him cleaned out- esp with constipation -04/20/24 occasionally elevated BP but improves quickly, no AD symptoms, monitor 9/9- had AD yesterday AM- no HA or nasal congestions but BP -9/12-15 no further AD. Having daily results with bowel program.    20. Spasticity-  9/5- will increase Baclofen  to 10 mg TID_ and first dose at 630am- d/w nursing and therapy that spasticity is progressing- once it starts, it's pretty fast initially- usually starts around 3-4 weeks and progresses, as I educated family about- and unfortunately we will try to keep up, but it's hard- he also has Zanaflex  2 mg TID AND 2 mg TID prn for additional spasms 9/8- has improved spasticity-  9/9- spasms much worse this AM- like didn't get meds, but per chart, received Baclofen  at 546 this AM- will increase Zanaflex  to 4 mg TID- it's progressing so fast- not sure due to inflammation from mild incision  infection- on keflex - can set it off 9/12-15 continue above schedule. Spasticity is MUCH improved compared to admit 21. 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 If so, will order SLP eval.  22. Wound abscess- in superficial tissues, not actual deep wound infection per Dr Darnella 9/9-11- NSU saw and put on keflex  for 7 days- will con't to monitor- is still draining- - per pt, Dr Darnella mentioned that if things weren't better at day 7 (9/15 at latest) that to call him back.   9/12-15 wound/drainage appear to be improving--continue with above  plan      LOS: 17 days A FACE TO FACE EVALUATION WAS PERFORMED  Arthea ONEIDA Gunther 04/29/2024, 1:26 PM

## 2024-04-29 NOTE — Progress Notes (Signed)
 Occupational Therapy Session Note  Patient Details  Name: Tyrone Mckinney MRN: [id number] Date of Birth: 02-Mar-1981  Today's Date: 04/29/2024 OT Individual Time: 1340-1435 OT Individual Time Calculation (min): 55 min    Short Term Goals: Week 3:  OT Short Term Goal 1 (Week 3): Pt will roll laterally in bed with Max A x1 to assist with ADL routine. OT Short Term Goal 2 (Week 3): Pt will self-feed with Mod A using compensatory techniques/strategies as needed. OT Short Term Goal 3 (Week 3): Pt will bathe face with Mod A using compensatory techniques/strategies as needed. OT Short Term Goal 4 (Week 3): Pt will maintain sitting balance EOB with max A in preparation for TB transfers  Skilled Therapeutic Interventions/Progress Updates:    Pt in bed upon arrival visiting with family and eating lunch. Pt reported he was incontinent of bladder. Skilled OT intervention with focus on bed moblity to facilitate pericare and changing LB clothing at bed level. Max A for rolling Lt and tot A for rolling Rt. Pt able to hook RUE on bed rail to maintain sidelying positiong to facilitate hygiene. Tot A for donning pants and shoes. Rolling R/L x 7 during session (toileting, donning pants, changing pads.) Maxi Move to TIS w/c. Pt with no reports of OH. Pt remained in TIS w/c with sip n puff in place. Pt able to activate sip n puff. Dtr present.   Therapy Documentation Precautions:  Precautions Precautions: Cervical, Fall Precaution/Restrictions Comments: MAP >65 Restrictions Weight Bearing Restrictions Per Provider Order: No LUE Weight Bearing Per Provider Order: Partial weight bearing Other Position/Activity Restrictions: Forearm based thumb spica LUE (per ortho); WHO RUE, B PRAFOs Pain:  Pt denies pain this afternoon   Therapy/Group: Individual Therapy  [name] 04/29/2024, 2:38 PM

## 2024-04-29 NOTE — Progress Notes (Signed)
 Physical Therapy Session Note  Patient Details  Name: Tyrone Mckinney MRN: [id number] Date of Birth: 1980/12/31  Today's Date: 04/29/2024 PT Individual Time: 0830-0930 PT Individual Time Calculation (min): 60 min   Short Term Goals: Week 2:  PT Short Term Goal 1 (Week 2): Pt will maintain static sitting with UE support with max a >30 sec PT Short Term Goal 2 (Week 2): Pt will recall and teach back pressure relief strategies PT Short Term Goal 3 (Week 2): Pt will be able to direct transfer with board with min cueing  Skilled Therapeutic Interventions/Progress Updates:    Pt recd in TIS, reports no pain during session. Pt transported to therapy gym for time management and energy conservation. Pt sat upright in TIS, but quickly reported light headedness. Session focused on Using TIS for upright tolerance and education on orthostatic hypotension. Vitals and symptoms as documented below.   35*: 91/63 (74) 35*: 105/77 (87) 20*: 101/76 (84) 10*: 81/71 (77) symptomatic 10*: 92/70 (79) symptomatic 10*: 119/83 (91) 0*: 82/63 (69)  0*: 81/71 (75)             Feels weak 18*: 105/75 (85)             Feels better  Pt reports feeling like he has urinated. Returned to room for slideboard transfer to bed with max a x 2,  pt able to participate in anterior lean and initiating Using RUE for transfer with minimal success. Bed mobility tot a x 2. Pt brief found to be clean. While checking brief, pt noted to have enough L glute activation to partially lift hips to assist with dressing! Performed x 3 with assist to hold BLE while donning/doffing pants. Required roll to L side with max a to complete donning. Pt left in chair position with HOB elevated ~45 deg to assist in further upright tolerance with sip and puff in reach.   Therapy Documentation Precautions:  Precautions Precautions: Cervical, Fall Precaution/Restrictions Comments: MAP >65 Restrictions Weight Bearing Restrictions Per Provider  Order: No LUE Weight Bearing Per Provider Order: Partial weight bearing Other Position/Activity Restrictions: Forearm based thumb spica LUE (per ortho); WHO RUE, B PRAFOs General:       Therapy/Group: Individual Therapy  Tyrone Mckinney 04/29/2024, 9:45 AM

## 2024-04-29 NOTE — Progress Notes (Incomplete)
 Physical Therapy Session Note  Patient Details  Name: Tyrone Mckinney MRN: [id number] Date of Birth: 02/23/1981  {CHL IP REHAB PT TIME CALCULATION:304800500}  Short Term Goals: {DUH:6958314}  Skilled Therapeutic Interventions/Progress Updates:   35*: 91/63 (74) 35*: 105/77 (87) 20*: 101/76 (84) 10*: 81/71 (77) symptomatic 10*: 92/70 (79) symptomatic 10*: 119/83 (91) 0*: 82/63 (69)  0*: 81/71 (75)  Feels weak 18*: 105/75 (85)  Feels better  Therapy Documentation Precautions:  Precautions Precautions: Cervical, Fall Precaution/Restrictions Comments: MAP >65 Restrictions Weight Bearing Restrictions Per Provider Order: No LUE Weight Bearing Per Provider Order: Partial weight bearing Other Position/Activity Restrictions: Forearm based thumb spica LUE (per ortho); WHO RUE, B PRAFOs   Therapy/Group: {Therapy/Group:[id number]}  Oneil Grumbles 04/29/2024, 8:57 AM

## 2024-04-29 NOTE — Progress Notes (Signed)
 Occupational Therapy Session Note  Patient Details  Name: Tyrone Mckinney MRN: [id number] Date of Birth: 08-29-1980  Today's Date: 04/29/2024 OT Individual Time: 0700-0810 OT Individual Time Calculation (min): 70 min    Short Term Goals: Week 3:  OT Short Term Goal 1 (Week 3): Pt will roll laterally in bed with Max A x1 to assist with ADL routine. OT Short Term Goal 2 (Week 3): Pt will self-feed with Mod A using compensatory techniques/strategies as needed. OT Short Term Goal 3 (Week 3): Pt will bathe face with Mod A using compensatory techniques/strategies as needed. OT Short Term Goal 4 (Week 3): Pt will maintain sitting balance EOB with max A in preparation for TB transfers  Skilled Therapeutic Interventions/Progress Updates:    Pt resting in bed upon arrival. Skilled OT intervention with focus on bed mobilty, LB/UB dressing, TB transfers, sitting balance, and safety awareness to increase independence with BADLs. All rolling in bed with tot A. Pt able to hook RUE on bed rails to maintain sidelying. Tot A for LB dressing tasks at bed level. Supine>sit EOB with tot A. Sitting balance EOB with max A TB transfer to w/c with tot A and max verbal cues for technique. Tot A for donning pull over shirt seated in TIS w/c. No reports of OH during session. Pt remained in w/c with all needs within reach. Pt able to actiate sip n puff call bell.   04/29/24 0813  SCIM SELF-CARE  Feeding (cutting, opening containers, pouring, bringing food to mouth, holding cup with fluid) 0  Bathing (soaping, washing, drying body and head, manipulating water tap) Upper Body;Lower Body  Bathing Upper Body 0  Bathing Lower Body 0  Dressing (clothes, shoes, permanent orthoses: dressing, wearing, undressing) Upper Body;Lower Body  Dressing Upper Body 1  Dressing Lower Body 0  Grooming (washing hands and face, brushing teeth, combing hair, shaving, applying makeup) 1  SCIM SELF-CARE SUBTOTAL ROW (0-20) 2    Therapy  Documentation Precautions:  Precautions Precautions: Cervical, Fall Precaution/Restrictions Comments: MAP >65 Restrictions Weight Bearing Restrictions Per Provider Order: No LUE Weight Bearing Per Provider Order: Partial weight bearing Other Position/Activity Restrictions: Forearm based thumb spica LUE (per ortho); WHO RUE, B PRAFOs General:   Vital Signs:   Pain: Pain Assessment Pain Scale: 0-10 Pain Score: 8  Pain Location: Shoulder Pain Intervention(s): Medication (See eMAR) ADL: ADL Eating: Dependent Where Assessed-Eating: Bed level Grooming: Dependent Where Assessed-Grooming: Bed level Upper Body Bathing: Dependent Where Assessed-Upper Body Bathing: Bed level Lower Body Bathing: Dependent Where Assessed-Lower Body Bathing: Bed level Upper Body Dressing: Dependent Where Assessed-Upper Body Dressing: Bed level Lower Body Dressing: Dependent Where Assessed-Lower Body Dressing: Bed level Toileting: Dependent Where Assessed-Toileting: Bed level Toilet Transfer: Not assessed Toilet Transfer Method: Not assessed Tub/Shower Transfer: Not assessed Walk-In Shower Transfer: Not assessed Vision   Perception    Praxis   Balance   Exercises:   Other Treatments:     Therapy/Group: Individual Therapy  [name] 04/29/2024, 8:14 AM

## 2024-04-29 NOTE — Progress Notes (Signed)
 IP Rehab Bowel Program Documentation   Bowel Program Start time [phone]  Dig Stim Indicated? Yes  Dig Stim Prior to Suppository or mini Enema X 1   Output from dig stim: None  Ordered intervention: Suppository Yes , mini enema No ,   Repeat dig stim after Suppository or Mini enema  X 2,  Output? {Desc; minimal/small/moderate/large/very large:110034}   Bowel Program Complete? {YES/NO:21197}, handoff given ***  Patient Tolerated? {YES/NO:21197}

## 2024-04-30 NOTE — Progress Notes (Signed)
 Occupational Therapy Session Note  Patient Details  Name: Kyros Serque Arocho MRN: [id number] Date of Birth: 05/06/1981  Today's Date: 04/30/2024 OT Individual Time: 0700-0800 OT Individual Time Calculation (min): 60 min    Short Term Goals: Week 3:  OT Short Term Goal 1 (Week 3): Pt will roll laterally in bed with Max A x1 to assist with ADL routine. OT Short Term Goal 2 (Week 3): Pt will self-feed with Mod A using compensatory techniques/strategies as needed. OT Short Term Goal 3 (Week 3): Pt will bathe face with Mod A using compensatory techniques/strategies as needed. OT Short Term Goal 4 (Week 3): Pt will maintain sitting balance EOB with max A in preparation for TB transfers  Skilled Therapeutic Interventions/Progress Updates:    Skilled OT intervention with focus on bed mobility, BUE PROM/AAROM, sitting balance, and activity tolerance to increase independence with BADLs. Rolling R/L in bed with tot A+1. Ted hose, pants, and shoes donned at bed level-dependent. BUE AAROM with improved biceps and shoulder elevation-trace finger flexion on RUE. Dependent transfer to TIS w/c with Maxi Move. Pt remained in w/c. Pt able to activate sip n puff.   Therapy Documentation Precautions:  Precautions Precautions: Cervical, Fall Precaution/Restrictions Comments: MAP >65 Restrictions Weight Bearing Restrictions Per Provider Order: No LUE Weight Bearing Per Provider Order: Touch down weight bearing Other Position/Activity Restrictions: Forearm based thumb spica LUE (per ortho); WHO RUE, B PRAFOs  Pain: Pt c/o 8/10 BUE paind; meds requested   Therapy/Group: Individual Therapy  [name] 04/30/2024, 9:15 AM

## 2024-04-30 NOTE — Plan of Care (Signed)
  Problem: Consults Goal: RH SPINAL CORD INJURY PATIENT EDUCATION Description:  See Patient Education module for education specifics.  Outcome: Progressing   Problem: SCI BOWEL ELIMINATION Goal: RH STG MANAGE BOWEL WITH ASSISTANCE Description: STG Manage Bowel with max Assistance. Outcome: Progressing   Problem: SCI BLADDER ELIMINATION Goal: RH STG MANAGE BLADDER WITH ASSISTANCE Description: STG Manage Bladder With max Assistance Outcome: Progressing   Problem: RH SKIN INTEGRITY Goal: RH STG SKIN FREE OF INFECTION/BREAKDOWN Description: Manage skin free of infection with max assistance Outcome: Progressing   Problem: RH SAFETY Goal: RH STG ADHERE TO SAFETY PRECAUTIONS W/ASSISTANCE/DEVICE Description: STG Adhere to Safety Precautions With max  Assistance/Device. Outcome: Progressing   Problem: RH PAIN MANAGEMENT Goal: RH STG PAIN MANAGED AT OR BELOW PT'S PAIN GOAL Description: <4 w/ prns Outcome: Progressing   Problem: RH KNOWLEDGE DEFICIT SCI Goal: RH STG INCREASE KNOWLEDGE OF SELF CARE AFTER SCI Description: Manage increase knowledge of self care after SCI with max assistance from spouse/ family using educational materials  provided.  Outcome: Progressing

## 2024-04-30 NOTE — Progress Notes (Signed)
 Patient ID: Tyrone Mckinney, male   DOB: 11-30-1980, 43 y.o.   MRN: [id number]   1218- SW met with [name] to provide updates from team conference, and d/c date remains 9/30, and will need to arrange family edu. He provided SW with number for his niece Tyrone Mckinney. SW shared limitations with regard to therapy due to being uninsured.  He shares pt will likely have all DME that he needs at time of discharge.   1230- SW spoke with [name] to discuss family edu. Fam edu on TR/Fr 8am-10am this week and next week.   Tyrone Mckinney, MSW, LCSW Office: [phone] Cell: [phone] Fax: [phone]

## 2024-04-30 NOTE — Patient Care Conference (Signed)
 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 Duwaine Barrs Social Worker Present: Graeme Jude, LCSW Nurse Present: Eulalio Falls, RN PT Present: Schuyler Batter, PT OT Present: Delon Sharps, OT;Charlena Cha, COTA PPS Coordinator present : Eleanor Colon, SLP     Current Status/Progress Goal Weekly Team Focus  Bowel/Bladder   Incontinent bladder/Bowel Program   Maintain positive results with bladder/bowel programs.   Avoid skin breakdown.    Swallow/Nutrition/ Hydration               ADL's   bed mobiity-tot A+2; BADLs at bed level-tot A+2; TB transfers-tot A+2; sitting balance-max A; SAEBO for RUE wrist/finger flexion-responding but not able to replicate; RUE biceps and deltoid slight improvement   mod A overall   sitting balance, tranfsers, education, directing self care    Mobility   tot +2 bed mobility and transfers, Improving LLE activation, trace in RLE. LImited functional ability, noted incr orthostatic hypotension 9/15   mod a bed mobility, max a bed/chair transfers, supervision w/c  transfer training, SCI education, potential for PWC intro    Communication                Safety/Cognition/ Behavioral Observations               Pain   C/o pain in shoulders/arms, medicated with  PRN Oxycodone .   Pain level less than or equal to 3 on pain scale.   Assess for pain q shift and PRN.    Skin   Incision posterior neck with dressing. Stage 2 sacrum with foam.   Skin to remain free from further breakdown/infection.  Assess skin q shift and PRN for breakdown.      Discharge Planning:  Uninsured. Pt will d/c to home with his ex-wife, and children. Reports his brother from [place]  is coming to visit-unsure on his length of stay. Fam meeting held last week. Family to begin looking for DME. SW will confirm there are no barriers to discharge.   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 Patient progress limited by neurogenic bowel and bladder, wound abscess, spasticity.pain/hypotension: medication adjusted by MD. Progress limited by weight bearing restrictions and limited functional ability.  Patient on target to meet rehab goals: Currently patient  needs total assistance +2 with ADLs , transfers and bed mobility with RUE with slight improvement.  Overall goals at discharge are set for mod-max assistance.  *See Care Plan and progress notes for long and short-term goals.   Revisions to care: Prevalon boots Air mattress Medihoney Power wheelchair   Teaching Needs: Safety, medications, transfers, bowel and bladder programs, etc.   Current Barriers to Discharge: Decreased caregiver support, Home enviroment access/layout, Neurogenic bowel and bladder, and Wound care  Possible Resolutions to Barriers:  Family education DME: hoyer lift, TTB, slide board     Medical Summary Current Status: midl drainage of neck incision- stage II on Sacrum- on air mattress- and medihoney- doing bowel program and kicking off his bladder  Barriers to Discharge: Automomic dysreflexia;Complicated Wound;Spasticity;Self-care education;Medical stability;Incontinence;Neurogenic Bowel & Bladder;Weight bearing  restrictions;Hypotension;Other (comments)  Barriers to Discharge Comments: limited by: quadriplegia- mild improvements, but not functional; trasnaminitis-  unisnured- hypotension- Possible Resolutions to [employer] Focus: increased spasticity meds- stage II on backside- medihoney and air mattress; putting in power w/c today- needs family education ASAP- d/c 9/30   Continued Need for Acute Rehabilitation Level of Care: The patient requires daily medical management by a physician with specialized training in physical medicine and rehabilitation for the following reasons: Direction of a multidisciplinary physical rehabilitation program to maximize functional independence : Yes Medical management of patient stability for increased activity during participation in an intensive rehabilitation regime.: Yes Analysis of laboratory values and/or radiology reports with any subsequent need for medication adjustment and/or medical intervention. : Yes   I attest that I was present, lead the team conference, and concur with the assessment and plan of the team.   Karalee Hauter Gayo 04/30/2024, 1122 am

## 2024-04-30 NOTE — Progress Notes (Signed)
 PROGRESS NOTE   Subjective/Complaints:  Pt reports had good BM with bowel program last night- per chart, was large.  Much less spasticity per pt and OT.      ROS:  Pt denies SOB, abd pain, CP, N/V/C/D, and vision changes   Objective:   No results found.   Recent Labs    04/29/24 0559  WBC 4.8  HGB 10.6*  HCT 32.0*  PLT 357     Recent Labs    04/29/24 0559  NA 139  K 4.5  CL 101  CO2 28  GLUCOSE 107*  BUN 22*  CREATININE 0.68  CALCIUM  9.3          Intake/Output Summary (Last 24 hours) at 04/30/2024 0855 Last data filed at 04/30/2024 0316 Gross per 24 hour  Intake 480 ml  Output 750 ml  Net -270 ml         Physical Exam: Vital Signs Blood pressure 99/70, pulse (!) 56, temperature 97.8 F (36.6 C), temperature source Oral, resp. rate 16, height 6' 4 (1.93 m), weight 88.9 kg, SpO2 97%.     General: awake, alert, appropriate, sitting up with OT;  NAD HENT: conjugate gaze; oropharynx moist CV: regular rate and rhythm- rate in low 60's; no JVD Pulmonary: CTA B/L; no W/R/R- good air movement GI: soft, NT, ND, (+)BS- normoactive Psychiatric: appropriate Neurological: Ox3  Ext: no clubbing, cyanosis, or edema Psych: pleasant and cooperative  Skin: minimal drainage from lower aspect of incision, sacral wound dressed.  Neurological:     Mental Status: He is alert.     Comments: Alert and oriented x 3. Normal insight and awareness. Intact Memory. Normal language and speech. Cranial nerve exam unremarkable. MMT: RUE: delt 2+/5, biceps 3- to 3/5, triceps maybe trace, wrist and HI all 0/5.  LUE: delt 1/5, biceps 2/5, triceps, wrist, HI all 0/5. RLE: 1/5 HE and ~1+/5 KE, 0/5 everywhere else. LLE: 2 to 2+/5 HF, HAD and KE, ADF/PF 2+/5. Sensory exam diminished below C5 on right and ~C6 on left. 1/2 sensation in trunk and both LE, as well as inguinal/rectal area. Sensed sl more in LLE. DTR's 1+ in BUE  and 3+ in BLE. Pt without resting LE tone. 1-2  beats of clonus in either foot.    Assessment/Plan: 1. Functional deficits which require 3+ hours per day of interdisciplinary therapy in a comprehensive inpatient rehab setting. Physiatrist is providing close team supervision and 24 hour management of active medical problems listed below. Physiatrist and rehab team continue to assess barriers to discharge/monitor patient progress toward functional and medical goals  Care Tool:  Bathing        Body parts bathed by helper: Buttocks, Right upper leg, Left upper leg, Right lower leg, Left lower leg, Face, Right arm, Chest, Left arm, Abdomen, Front perineal area     Bathing assist Assist Level: Dependent - Patient 0%     Upper Body Dressing/Undressing Upper body dressing   What is the patient wearing?: Pull over shirt    Upper body assist Assist Level: Dependent - Patient 0%    Lower Body Dressing/Undressing Lower body dressing      What is the  patient wearing?: Incontinence brief     Lower body assist Assist for lower body dressing: Dependent - Patient 0%     Toileting Toileting    Toileting assist Assist for toileting: Dependent - Patient 0%     Transfers Chair/bed transfer  Transfers assist     Chair/bed transfer assist level: 2 Helpers     Locomotion Ambulation   Ambulation assist   Ambulation activity did not occur: Safety/medical concerns          Walk 10 feet activity   Assist  Walk 10 feet activity did not occur: Safety/medical concerns        Walk 50 feet activity   Assist Walk 50 feet with 2 turns activity did not occur: Safety/medical concerns         Walk 150 feet activity   Assist Walk 150 feet activity did not occur: Safety/medical concerns         Walk 10 feet on uneven surface  activity   Assist Walk 10 feet on uneven surfaces activity did not occur: Safety/medical concerns         Wheelchair     Assist  Is the patient using a wheelchair?: Yes Type of Wheelchair: Manual    Wheelchair assist level: Dependent - Patient 0% Max wheelchair distance: 150    Wheelchair 50 feet with 2 turns activity    Assist        Assist Level: Dependent - Patient 0%   Wheelchair 150 feet activity     Assist      Assist Level: Dependent - Patient 0%   Blood pressure 99/70, pulse (!) 56, temperature 97.8 F (36.6 C), temperature source Oral, resp. rate 16, height 6' 4 (1.93 m), weight 88.9 kg, SpO2 97%.  Medical Problem List and Plan: 1. 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 Darnella of [facility] on 03/28/24:             -patient may not yet shower             -ELOS/Goals: 05/14/24.  mod to max assist goals             -No collar needed per Dr. Darnella             -Bilateral PRAFO's, R WHO.  -Will switch PRAFOs to Prevalons due to spasticity -Con't CIR PT and OT  Team conference today to f/u on progress 2.  Antithrombotics: -DVT/anticoagulation:  Pharmaceutical: Lovenox  30mg  BID 9/1- is (-) for DVT 9/4- needs Elqiuis or Lovenox  for a total of 3 months             -antiplatelet therapy: ASA 81mg  daily 3. Pain Management: Cymbalta  30mg  nightly, Gabapentin  600mg  TID, Tylenol  1000mg  q6h, Robaxin  1000mg  q8h, oxycodone  10-15mg  q4h prn,  -Consider increasing Cymbalta  to 60mg  nightly for improved pain control if needed -Pt developing early hypertonicity in LE's. Will begin scheduling tizanidine  at 2mg  tid to start. Can have 2mg  tid prn as well -04/13/24 decrease tylenol  650mg  QID d/t transminitis as below 9/1- changed Cymbalta  to qday since had nightmares last night- didn't take trazodone . 9/3- still having nightmares, but not med  related- might be PTSD? Vs subconscious dealing with his injury? 9/15- pain is improved. Pt satsified 4. Mood/Behavior/Sleep: ego support             -  antipsychotic agents: n/a             -Psych consult inpatient, no needs at that time -Pt will need emotional support. Seems to be pretty upbeat. Family sounds supportive. Will have Dr. Corina see pt at some point during admit  9/4- D/w pt and family today that he's very 'bright- but I'm expecting him to hit a wall when all this really hits.  9/12- mood remains positive 5. Neuropsych/cognition: This patient is capable of making decisions on his own behalf. 6. Skin/Wound Care/large forehead laceration: staples removed 04/04/24.  Routine skin care.  9/8- incision on posterior neck puffy, draining, called Dr Darnella via NP 7. Fluids/Electrolytes/Nutrition: routine I&O, routine labs, continue vitamins/supplements (Ensure plus high protein BID, MVI)             -Regular diet 8. 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 Lester of [facility], continue ASA 81mg  daily 9. Superior endplate compression fx T5, T7, T8, T9, B/L upper thoracic posterior paraspinal muscle injury and edema, interspinous ligament injury T2-3 and possibly T3-4: no intervention needed. Per Dr. Darnella.  10. B/L apical pulmonary contusions, small pulmonary lac RLL: aggressive pulmonary toilet 11. L Ulnar styloid fx: NWB LUE, splint, f/up Dr. Murrell 9/4- when gets closer to 6 weeks, will call Dr Marjorie to go over when can WBAT 9/9- cannot see a surgery done? So 6 weeks from 8/14- called Dr Marjorie- ordered a wrist xray to f/u- and unlikely to be able to [facility] prior to d/c  12. Orthostatic hypotension: continue Midodrine  20mg  TID, Strattera  25mg  daily also started per Dr. Jyl recs with goal perhaps of coming off midodrine . -continue TEDs and abdominal binder -04/14/24 BPs improving, monitor 9/1-9/2 in general BP's soft, in 100s-110s. Supine, but doing better- con't  regimen 9/3-9/4 BP's running low 90's to 110's- no concern about OH currently per therapy -9/6-7/25 BP occasionally elevated briefly but improves, no AD sxs, monitor 9/8- BP 131 systolic this AM! Sitting upright 9/15--bp remains very soft. Continue acclimation with PT 9/16- BP still very soft, but doing better in last 24 hours- low 100's systolic- -con't regimen Vitals:   04/27/24 1925 04/28/24 0448 04/28/24 0448 04/28/24 0543  BP: 110/77 (!) 85/62 (!) 85/62 (!) 89/61   04/28/24 0545 04/28/24 1512 04/28/24 2008 04/29/24 0301  BP: (!) 89/61 113/73 104/76 (!) 98/56   04/29/24 1200 04/29/24 1536 04/30/24 0316 04/30/24 0800  BP: 109/76 105/71 106/68 99/70    13. Neurogenic bowel: on  bowel program: Dulcolax suppository 10mg  daily, Miralax  BID, SenokotS 1 tab BID             -will adjust bowel program to 1800 daily             -senokot-s 2 tabs q am and miralax  BID  9/5- no BM last night and none with another bowel program this AM in spite of being done-- will give Sorbitol  60cc now and then SSE this evening with bowel program- ordered -04/21/24 small BM last night-- monitor 9/9- Will give 60cc Sorbitol  at 2pm and SE at 6pm at part of bowel program- cannot add Linzess since isn't insured- might need at home to take Mg citrate every few days- also taking a lot of Oxycodone  as well - hopefully this will decrease over time.  9/11 having response to bowel program but stool too soft  -hold miralax  bid and 3 senna-s's daily for now  -resume them in some form tomorrow 9/12 formed stool last night,  resume daily miralax  tomorrow and 2 senna-s tabs today -9/15 had a large T6 bm at 0100 this am 9/16- Had large BM overnight with bowel program 14. Neurogenic bladder: continue ISC q6h             -adjust I/O cath schedule to volumes of 300-500 cc -04/13/24 apparently used condom cath last night-- does feel urge to urinate, no bladder scans documented but pt says they were done but no ISC needed -Reviewed  with nursing the plan for him, no condom cath for now, attempt with urinal q4-6h and bladder scan -ISC if scan >250cc 9/4- still not voiding with toileting 9/5- I still think he's kicking off, but not emptying- he has no feeling of voiding, just leaking-- same 9/6-7, no caths still, but incontinent 9/12-15 pt has been incontinent with low to medium PVR's. He insists that he can feel when he needs to empty and that staff can't get to him in time. He has mentioned not wanting to bother nursing so much.  -continue timed voids q3-4 hours and obsv  -encouraged pt to notify staff when he needs to empty -continue voiding trial/education 9/16- Pt says staff cannot get to him, but mentions the time is very short- like 1-2 minutes at times- of note- con't regimen 15. Polysubstance use disorder: UDS +cocaine, THC, Benzos-- provide counseling 16. 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 F/up outpatient.  17. ABLA: hgb stable 8-9 range, monitor twice weekly  -04/13/24 Hgb 9.6, monitor>> stable 9/4 at 10 18. Transaminitis: -04/13/24 Alk phos 286, AST 58, ALT 153-- could be from shock liver, monitor again on Monday. Decrease tylenol  to 650mg  QID. No abd pain/n/v but consider imaging/work up if those develop.  9/1-  Down to 42 and 119 from 100 and 153- will reduction in Tylenol - -if not better Thursday, will reduce Tylenol  further 9/4- will stop scheduled tylenol  and make 325 mg q6 hours prn if need be 9/8- AST/ALT about the same- 48/110- and alk phos 213 (alk phos is down substantially)- if not down by Thursday, will reassess 9/9- cannot start Dantrolene with these issues 9/16- AST 42 up from 29 and ALT 95 up from 74- will recheck Thursday- because not taking tylenol - could be baclofen , but I don't know what else can can do for spasticity? 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 - pt had BP yesterday of 166/92- pt admits had HA- so had AD- educated pt and family on AD- will continue to educate staff, because we were not called on the probable AD episode yesterday- 166/92 was BP- isolated spike in BP = AD esp in pt that has OH- went over with PA and staff about how to treat- sit pt up, loosen tight clothing, then if doesn't improve, cath him; do bowel program- that's 80% of all AD causes- if not improved, let PA/Doctor know 9/5- Had BP of 177/92 this AM- likely AD, although no HA- nurse did great catch and tried to do bowel program- no results- will order another round of Sorbitol  60cc with SSE this evening to get him cleaned out- esp with constipation -04/20/24 occasionally elevated BP but improves quickly, no AD symptoms, monitor 9/9- had AD yesterday AM- no HA or nasal congestions but BP -9/12-15 no further AD. Having daily results with bowel program.    20. Spasticity-  9/5- will increase Baclofen  to 10 mg TID_ and first dose at 630am- d/w nursing and therapy that spasticity is progressing- once it starts, it's pretty fast initially- usually starts around 3-4 weeks and progresses, as I educated family about- and unfortunately we will try to keep up, but it's hard- he also has Zanaflex  2 mg TID AND 2 mg TID prn for additional spasms 9/8- has improved spasticity-  9/9- spasms much worse this AM- like didn't get meds, but per chart, received Baclofen  at 546 this AM- will increase Zanaflex  to 4 mg TID- it's progressing so fast- not sure due to inflammation from mild incision infection- on keflex - can set it off 9/12-15 continue above schedule. Spasticity is MUCH improved compared to admit 9/16- spasticity doing better per pt and OT 21. 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 If so, will order SLP eval.  22. Wound abscess- in superficial tissues, not actual deep wound infection per Dr Darnella 9/9-11- NSU saw and put on keflex  for 7 days- will con't to monitor- is still draining- - per pt, Dr Darnella mentioned that if things weren't better at day 7 (9/15 at latest) that to call him back.   9/12-15 wound/drainage appear to be improving--continue with above plan  I spent a total of 36   minutes on total care today- >50% coordination of care- due to  D/w pt and OT- also reviewed albs and notes since I saw him last- and team conference today    LOS: 18 days A FACE TO FACE EVALUATION WAS PERFORMED  Kyah Buesing 04/30/2024, 8:55 AM

## 2024-04-30 NOTE — Progress Notes (Signed)
 Physical Therapy Weekly Progress Note  Patient Details  Name: Freeman Serque Gillies MRN: [id number] Date of Birth: [birth year]  Beginning of progress report period: April 22, 2024 End of progress report period: April 30, 2024  Today's Date: 04/30/2024 PT Individual Time: 0945-1100,1135-1240 PT Individual Time Calculation (min): 75 min,   Patient has met 1 of 3 short term goals.  Pt is progressing well with initiation during bed mobility and transfers. Sitting balance is improving with noted incr in ab and spinal extensor muscle activation, as well as emerging ability to use momentum for mobility.   Patient continues to demonstrate the following deficits muscle weakness and muscle paralysis, impaired timing and sequencing, abnormal tone, and decreased coordination, and decreased sitting balance, decreased postural control, decreased balance strategies, and difficulty maintaining precautions and therefore will continue to benefit from skilled PT intervention to increase functional independence with mobility.  Patient progressing toward long term goals..  Continue plan of care.  PT Short Term Goals Week 2:  PT Short Term Goal 1 (Week 2): Pt will maintain static sitting with UE support with max a >30 sec PT Short Term Goal 1 - Progress (Week 2): Progressing toward goal PT Short Term Goal 2 (Week 2): Pt will recall and teach back pressure relief strategies PT Short Term Goal 2 - Progress (Week 2): Progressing toward goal PT Short Term Goal 3 (Week 2): Pt will be able to direct transfer with board with min cueing PT Short Term Goal 3 - Progress (Week 2): Met Week 3:  PT Short Term Goal 1 (Week 3): Pt will recall and teach back pressure relief strategies PT Short Term Goal 2 (Week 3): Pt will initiate PWC navigation with assist PT Short Term Goal 3 (Week 3): Pt will direct transfers without cue  Skilled Therapeutic Interventions/Progress Updates:    Session 1: Pt seated in w/c on  arrival and agreeable to therapy. No complaint of pain. Pt transported to therapy gym for time management and energy conservation. slideboard transfer with max-tot +2 with pt cued to use BLE for transfer. Introduced care direction for transfer with pt able to direct transfer with cueing. Session focused on NMR for dynamic balance and functional weight shifting anterior/posterior. Pt was able to partially prop RUE behind him with assist to complete motion. Pt then able to use momentum and abs to pull forward to prop elbows on knees with assist to maintain position on knees. Practiced 5 sec holds with pt pushing chest upward (push up plus) before using momentum and UE push to return to prop position. Maintained prop with RUE with assist on L side of trunk to replace LUE d/t non WB. Pt also worked on lateral leans to prop on elbow, requiring max a most attempts to recover. Pt also participated in ball kicks with BLE, LLE greater reps than RLE to allow for time to rest as RLE is just recently emerging. Pt returned to chair in same manner, remained up in TIS, sip and puff in place.  Session 2: Pt seated in w/c on arrival and agreeable to therapy. Session focused on setting up and introducing PWC. slideboard transfer <> mat table in same manner as am session. Pt participated in static balance >5 sec and sit ups to physio ball using momentum and attempting to find static balance. Able to maintain 5-10 sec with CGA, min-mod to recover LOB. Pt transferred into PWC and trialled different drive control set ups with assist from ATP. Plan to further adjust in  the AM. Instructed pt on using menu to access controls for pressure relief, pt expressed understanding but would benefit from further instruction. Pt instructed to continue calling for assistance with pressure relief as hand function prevents him from completing independently at this time. Pt remained in PWC at end of session and was left with all needs in reach and his  brother present, sip and puff in place.    Therapy Documentation Precautions:  Precautions Precautions: Cervical, Fall Precaution/Restrictions Comments: MAP >65 Restrictions Weight Bearing Restrictions Per Provider Order: No LUE Weight Bearing Per Provider Order: Touch down weight bearing Other Position/Activity Restrictions: Forearm based thumb spica LUE (per ortho); WHO RUE, B PRAFOs General:        Therapy/Group: Individual Therapy  [name] 04/30/2024, 1:15 PM

## 2024-04-30 NOTE — Progress Notes (Shared)
 IP Rehab Bowel Program Documentation   Bowel Program Start time 1810  Dig Stim Indicated? Yes  Dig Stim Prior to Suppository or mini Enema X 1   Output from dig stim: None  Ordered intervention: Suppository Yes , mini enema No ,   Repeat dig stim after Suppository or Mini enema  X 2,  Output? {Desc; minimal/small/moderate/large/very large:110034}   Bowel Program Complete? {YES/NO:21197}, handoff given ***  Patient Tolerated? {YES/NO:21197}

## 2024-05-01 NOTE — Plan of Care (Signed)
  Problem: Consults Goal: RH SPINAL CORD INJURY PATIENT EDUCATION Description:  See Patient Education module for education specifics.  Outcome: Progressing   Problem: SCI BOWEL ELIMINATION Goal: RH STG MANAGE BOWEL WITH ASSISTANCE Description: STG Manage Bowel with max Assistance. Outcome: Progressing   Problem: SCI BLADDER ELIMINATION Goal: RH STG MANAGE BLADDER WITH ASSISTANCE Description: STG Manage Bladder With max Assistance Outcome: Progressing   Problem: RH SKIN INTEGRITY Goal: RH STG SKIN FREE OF INFECTION/BREAKDOWN Description: Manage skin free of infection with max assistance Outcome: Progressing   Problem: RH SAFETY Goal: RH STG ADHERE TO SAFETY PRECAUTIONS W/ASSISTANCE/DEVICE Description: STG Adhere to Safety Precautions With max  Assistance/Device. Outcome: Progressing   Problem: RH PAIN MANAGEMENT Goal: RH STG PAIN MANAGED AT OR BELOW PT'S PAIN GOAL Description: <4 w/ prns Outcome: Progressing   Problem: RH KNOWLEDGE DEFICIT SCI Goal: RH STG INCREASE KNOWLEDGE OF SELF CARE AFTER SCI Description: Manage increase knowledge of self care after SCI with max assistance from spouse/ family using educational materials  provided.  Outcome: Progressing

## 2024-05-01 NOTE — Progress Notes (Signed)
 Patient ID: Tyrone Mckinney, male   DOB: 05-13-1981, 43 y.o.   MRN: [id number]  SW completed and sent transportation application to [employer] at [email or web address] .   Graeme Jude, MSW, LCSW Office: [phone] Cell: [phone] Fax: [phone]

## 2024-05-01 NOTE — Progress Notes (Signed)
 Physical Therapy Session Note  Patient Details  Name: Tyrone Mckinney MRN: [id number] Date of Birth: [birth year]  Today's Date: 05/01/2024 PT Individual Time: 0800-0900 1350-1445 PT Individual Time Calculation (min): 60 min and 55 min  Short Term Goals: Week 3:  PT Short Term Goal 1 (Week 3): Pt will recall and teach back pressure relief strategies PT Short Term Goal 2 (Week 3): Pt will initiate PWC navigation with assist PT Short Term Goal 3 (Week 3): Pt will direct transfers without cue  Skilled Therapeutic Interventions/Progress Updates:   First session:  Pt presents semi-reclined in bed and agreeable to therapy.  Pt states soreness to R shoulder, receiving pain meds for nursing during session.  PT donned THT w/ total A.  Scrub shorts threaded over feet and pulled to buttocks.  BLES flexed to hooklying and assist to maintain for attempted bridging, but unable to fully clear hips.  Pt rolled side to side to complete donning w/ +2 assist.  Pt rolled for lift pad placement.  Pt transferred via [place] to [facility] w/ total A.  Pt positioned in chair w/ pillow to L UE for support.  Seat belt on and dtr in room for all needs.  Second session:  Pt presents late to therapy 2/2 pt outside w/ family member.  Missed 20' of therapy time.  Pt presents sitting in PWC and agreeable to therapy.  Pt negotiating PWC in cone obstacle course w/ supervision, cues for return to midline sitting as pt tends to lean R.  PT trialed Dycem to R arm rest to maintain R FA position but still able to rotate FA for goalpost controls.  Pt negotiated down hallways w/ decreasing turns for control w/in confined space.  Pt speed increased during open spaces in hallway, but decreased in confined spaces.  Pt practiced pressure relief using goalpost controls, but requires assist to change to position change setting as well as turn on/off w/c.  Pt performed reverse driving both in open space and confined space (to position next to bed).   Sip and spit positioned for use, seat belt on and reclined back.       Therapy Documentation Precautions:  Precautions Precautions: Cervical, Fall Precaution/Restrictions Comments: MAP >65 Restrictions Weight Bearing Restrictions Per Provider Order: No LUE Weight Bearing Per Provider Order: Touch down weight bearing Other Position/Activity Restrictions: Forearm based thumb spica LUE (per ortho); WHO RUE, B PRAFOs General:   Vital Signs:   Pain: 4/10 R shoulder both sessions Pain Assessment Pain Scale: 0-10 Pain Score: 8  Pain Location: Arm     Therapy/Group: Individual Therapy  Chariti Havel P Nain Rudd 05/01/2024, 9:01 AM

## 2024-05-01 NOTE — Progress Notes (Signed)
 PROGRESS NOTE   Subjective/Complaints:  Pt reports spasticity is still good.   Per chart, had large BM at 11:45pm- don't see bowel program documented.   No other issues.   Was not sure had BM last night- wasn't told.   ROS:  Per HPI  Pt denies SOB, abd pain, CP, N/V/C/D, and vision changes   Objective:   No results found.   Recent Labs    04/29/24 0559  WBC 4.8  HGB 10.6*  HCT 32.0*  PLT 357     Recent Labs    04/29/24 0559  NA 139  K 4.5  CL 101  CO2 28  GLUCOSE 107*  BUN 22*  CREATININE 0.68  CALCIUM  9.3          Intake/Output Summary (Last 24 hours) at 05/01/2024 0825 Last data filed at 05/01/2024 0404 Gross per 24 hour  Intake 120 ml  Output 400 ml  Net -280 ml         Physical Exam: Vital Signs Blood pressure 112/80, pulse 65, temperature 97.6 F (36.4 C), temperature source Oral, resp. rate 16, height 6' 4 (1.93 m), weight 88.9 kg, SpO2 100%.      General: awake, alert, appropriate, sitting up in bed; daughter in room; NAD HENT: conjugate gaze; oropharynx moist CV: regular rate and rhythm; no JVD Pulmonary: CTA B/L; no W/R/R- good air movement GI: soft, NT, ND, (+)BS Psychiatric: appropriate Neurological: Ox3 Fewer LE spasms, but if moved abruptly, still has a few spasms in LE's-  MSK: R leg at mild angle this Am in prevalons- fixed to have kick stand on outside of prevalon  Ext: no clubbing, cyanosis, or edema Psych: pleasant and cooperative  Skin: minimal drainage from lower aspect of incision, sacral wound dressed.  Neurological:     Mental Status: He is alert.     Comments: Alert and oriented x 3. Normal insight and awareness. Intact Memory. Normal language and speech. Cranial nerve exam unremarkable. MMT: RUE: delt 2+/5, biceps 3- to 3/5, triceps maybe trace, wrist and HI all 0/5.  LUE: delt 1/5, biceps 2/5, triceps, wrist, HI all 0/5. RLE: 1/5 HE and ~1+/5 KE,  0/5 everywhere else. LLE: 2 to 2+/5 HF, HAD and KE, ADF/PF 2+/5. Sensory exam diminished below C5 on right and ~C6 on left. 1/2 sensation in trunk and both LE, as well as inguinal/rectal area. Sensed sl more in LLE. DTR's 1+ in BUE and 3+ in BLE. Pt without resting LE tone. 1-2  beats of clonus in either foot.    Assessment/Plan: 1. Functional deficits which require 3+ hours per day of interdisciplinary therapy in a comprehensive inpatient rehab setting. Physiatrist is providing close team supervision and 24 hour management of active medical problems listed below. Physiatrist and rehab team continue to assess barriers to discharge/monitor patient progress toward functional and medical goals  Care Tool:  Bathing        Body parts bathed by helper: Buttocks, Right upper leg, Left upper leg, Right lower leg, Left lower leg, Face, Right arm, Chest, Left arm, Abdomen, Front perineal area     Bathing assist Assist Level: Dependent - Patient 0%  Upper Body Dressing/Undressing Upper body dressing   What is the patient wearing?: Pull over shirt    Upper body assist Assist Level: Dependent - Patient 0%    Lower Body Dressing/Undressing Lower body dressing      What is the patient wearing?: Incontinence brief     Lower body assist Assist for lower body dressing: Dependent - Patient 0%     Toileting Toileting    Toileting assist Assist for toileting: Dependent - Patient 0%     Transfers Chair/bed transfer  Transfers assist     Chair/bed transfer assist level: 2 Helpers     Locomotion Ambulation   Ambulation assist   Ambulation activity did not occur: Safety/medical concerns          Walk 10 feet activity   Assist  Walk 10 feet activity did not occur: Safety/medical concerns        Walk 50 feet activity   Assist Walk 50 feet with 2 turns activity did not occur: Safety/medical concerns         Walk 150 feet activity   Assist Walk 150 feet  activity did not occur: Safety/medical concerns         Walk 10 feet on uneven surface  activity   Assist Walk 10 feet on uneven surfaces activity did not occur: Safety/medical concerns         Wheelchair     Assist Is the patient using a wheelchair?: Yes Type of Wheelchair: Manual    Wheelchair assist level: Dependent - Patient 0% Max wheelchair distance: 150    Wheelchair 50 feet with 2 turns activity    Assist        Assist Level: Dependent - Patient 0%   Wheelchair 150 feet activity     Assist      Assist Level: Dependent - Patient 0%   Blood pressure 112/80, pulse 65, temperature 97.6 F (36.4 C), temperature source Oral, resp. rate 16, height 6' 4 (1.93 m), weight 88.9 kg, SpO2 100%.  Medical Problem List and Plan: 1. 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 Darnella of [facility] on 03/28/24:             -patient may not yet shower             -ELOS/Goals: 05/14/24.  mod to max assist goals             -No collar needed per Dr. Darnella             -Bilateral PRAFO's, R WHO.  -Will switch PRAFOs to Prevalons due to spasticity D/c date 9/30 Con't CIR PT and OT-family training scheduled per SW 2.  Antithrombotics: -DVT/anticoagulation:  Pharmaceutical: Lovenox  30mg  BID 9/1- is (-) for DVT 9/4- needs Elqiuis or Lovenox  for a total of 3 months             -antiplatelet therapy: ASA 81mg  daily 3. Pain Management: Cymbalta  30mg  nightly, Gabapentin  600mg  TID, Tylenol  1000mg  q6h, Robaxin  1000mg  q8h, oxycodone  10-15mg  q4h prn,  -Consider increasing Cymbalta  to 60mg  nightly for improved pain control if needed -Pt developing early hypertonicity in LE's. Will begin scheduling tizanidine  at 2mg  tid  to start. Can have 2mg  tid prn as well -04/13/24 decrease tylenol  650mg  QID d/t  transminitis as below 9/1- changed Cymbalta  to qday since had nightmares last night- didn't take trazodone . 9/3- still having nightmares, but not med related- might be PTSD? Vs subconscious dealing with his injury? 9/15- pain is improved. Pt satsified 4. Mood/Behavior/Sleep: ego support             -antipsychotic agents: n/a             -Psych consult inpatient, no needs at that time -Pt will need emotional support. Seems to be pretty upbeat. Family sounds supportive. Will have Dr. Corina see pt at some point during admit  9/4- D/w pt and family today that he's very 'bright- but I'm expecting him to hit a wall when all this really hits.  9/17- mood still extremely positive- needs family education done 5. Neuropsych/cognition: This patient is capable of making decisions on his own behalf. 6. Skin/Wound Care/large forehead laceration: staples removed 04/04/24.  Routine skin care.  9/8- incision on posterior neck puffy, draining, called Dr Darnella via NP 7. Fluids/Electrolytes/Nutrition: routine I&O, routine labs, continue vitamins/supplements (Ensure plus high protein BID, MVI)             -Regular diet 8. 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 Lester of [facility], continue ASA 81mg  daily 9. Superior endplate compression fx T5, T7, T8, T9, B/L upper thoracic posterior paraspinal muscle injury and edema, interspinous ligament injury T2-3 and possibly T3-4: no intervention needed. Per Dr. Darnella.  10. B/L apical pulmonary contusions, small pulmonary lac RLL: aggressive pulmonary toilet 11. L Ulnar styloid fx: NWB LUE, splint, f/up Dr. Murrell 9/4- when gets closer to 6 weeks, will call Dr Marjorie to go over when can WBAT 9/9- cannot see a surgery done? So 6 weeks from 8/14- called Dr Marjorie- ordered a wrist xray to f/u- and unlikely to be able to [facility] prior to d/c  12. Orthostatic hypotension: continue Midodrine   20mg  TID, Strattera  25mg  daily also started per Dr. Jyl recs with goal perhaps of coming off midodrine . -continue TEDs and abdominal binder -04/14/24 BPs improving, monitor 9/1-9/2 in general BP's soft, in 100s-110s. Supine, but doing better- con't regimen 9/3-9/4 BP's running low 90's to 110's- no concern about OH currently per therapy -9/6-7/25 BP occasionally elevated briefly but improves, no AD sxs, monitor 9/8- BP 131 systolic this AM! Sitting upright 9/15--bp remains very soft. Continue acclimation with PT 9/16- 9/17- BP still very soft, but doing better in last 24 hours- low 100's systolic- -con't regimen Vitals:   04/28/24 0543 04/28/24 0545 04/28/24 1512 04/28/24 2008  BP: (!) 89/61 (!) 89/61 113/73 104/76   04/29/24 0301 04/29/24 1200 04/29/24 1536 04/30/24 0316  BP: (!) 98/56 109/76 105/71 106/68   04/30/24 0800 04/30/24 1325 04/30/24 2006 05/01/24 0404  BP: 99/70 114/78 134/86 112/80    13. Neurogenic bowel: on  bowel program: Dulcolax suppository 10mg  daily, Miralax  BID, SenokotS 1 tab BID             -will adjust bowel program to 1800 daily             -senokot-s 2 tabs q am and miralax  BID  9/5- no BM last night and none with another bowel program this AM in spite of being done-- will give Sorbitol  60cc now and then SSE this evening with bowel program- ordered -04/21/24 small BM last night-- monitor 9/9- Will give 60cc Sorbitol  at 2pm and SE at 6pm at part of bowel program- cannot add Linzess since isn't insured-  might need at home to take Mg citrate every few days- also taking a lot of Oxycodone  as well - hopefully this will decrease over time.  9/11 having response to bowel program but stool too soft  -hold miralax  bid and 3 senna-s's daily for now  -resume them in some form tomorrow 9/12 formed stool last night, resume daily miralax  tomorrow and 2 senna-s tabs today -9/15 had a large T6 bm at 0100 this am 9/16- Had large BM overnight with bowel program 9/17- had  large BM at 11:45 pm-  14. Neurogenic bladder: continue ISC q6h             -adjust I/O cath schedule to volumes of 300-500 cc -04/13/24 apparently used condom cath last night-- does feel urge to urinate, no bladder scans documented but pt says they were done but no ISC needed -Reviewed with nursing the plan for him, no condom cath for now, attempt with urinal q4-6h and bladder scan -ISC if scan >250cc 9/4- still not voiding with toileting 9/5- I still think he's kicking off, but not emptying- he has no feeling of voiding, just leaking-- same 9/6-7, no caths still, but incontinent 9/12-15 pt has been incontinent with low to medium PVR's. He insists that he can feel when he needs to empty and that staff can't get to him in time. He has mentioned not wanting to bother nursing so much.  -continue timed voids q3-4 hours and obsv  -encouraged pt to notify staff when he needs to empty -continue voiding trial/education 9/16- Pt says staff cannot get to him, but mentions the time is very short- like 1-2 minutes at times- of note- con't regimen 9/17- still kicking off- incontinent 15. Polysubstance use disorder: UDS +cocaine, THC, Benzos-- provide counseling 16. 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 F/up outpatient.  17. ABLA: hgb stable 8-9 range, monitor twice weekly  -04/13/24 Hgb 9.6, monitor>> stable 9/4 at 10 18. Transaminitis: -04/13/24 Alk phos 286, AST 58, ALT 153-- could be from shock liver, monitor again on Monday. Decrease tylenol  to 650mg  QID. No abd pain/n/v but consider imaging/work up if those develop.  9/1-  Down to 60 and 119 from 90 and 153- will reduction in Tylenol - -if not better Thursday, will reduce Tylenol  further 9/4- will stop scheduled tylenol  and make 325 mg q6 hours prn if need be 9/8- AST/ALT about the  same- 48/110- and alk phos 213 (alk phos is down substantially)- if not down by Thursday, will reassess 9/9- cannot start Dantrolene with these issues 9/16- AST 42 up from 29 and ALT 95 up from 74- will recheck Thursday- because not taking tylenol - could be baclofen , but I don't know what else can can do for spasticity? 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 - pt had BP yesterday of 166/92- pt admits had HA- so had AD- educated pt and family on AD- will continue to educate staff, because we were not called on the probable AD episode yesterday- 166/92 was BP- isolated spike in BP = AD esp in pt that has OH- went over with PA and staff about how to treat- sit pt up, loosen tight clothing, then if doesn't improve, cath him; do bowel program- that's 80% of all AD causes- if not improved, let PA/Doctor know 9/5- Had BP of 177/92 this AM- likely AD, although  no HA- nurse did great catch and tried to do bowel program- no results- will order another round of Sorbitol  60cc with SSE this evening to get him cleaned out- esp with constipation -04/20/24 occasionally elevated BP but improves quickly, no AD symptoms, monitor 9/9- had AD yesterday AM- no HA or nasal congestions but BP -9/12-15 no further AD. Having daily results with bowel program.    20. Spasticity-  9/5- will increase Baclofen  to 10 mg TID_ and first dose at 630am- d/w nursing and therapy that spasticity is progressing- once it starts, it's pretty fast initially- usually starts around 3-4 weeks and progresses, as I educated family about- and unfortunately we will try to keep up, but it's hard- he also has Zanaflex  2 mg TID AND 2 mg TID prn for additional spasms 9/8- has improved spasticity-  9/9- spasms much worse this AM- like didn't get meds, but per chart, received Baclofen  at 546 this AM- will increase Zanaflex  to 4 mg TID- it's progressing so fast- not sure due to inflammation from mild incision infection- on keflex -  can set it off 9/12-15 continue above schedule. Spasticity is MUCH improved compared to admit 9/16-9/17- spasticity doing better per pt and OT 21. 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 If so, will order SLP eval.  22. Wound abscess- in superficial tissues, not actual deep wound infection per Dr Darnella 9/9-11- NSU saw and put on keflex  for 7 days- will con't to monitor- is still draining- - per pt, Dr Darnella mentioned that if things weren't better at day 7 (9/15 at latest) that to call him back.   9/12-15 wound/drainage appear to be improving--continue with above plan  I spent a total of 36   minutes on total care today- >50% coordination of care- due to  D/w pt and OT- also reviewed albs and notes since I saw him last- and team conference today    LOS: 19 days A FACE TO FACE EVALUATION WAS PERFORMED  Tyrone Mckinney 05/01/2024, 8:25 AM

## 2024-05-01 NOTE — Progress Notes (Signed)
 Occupational Therapy Session Note  Patient Details  Name: Kristapher Serque Glatt MRN: [id number] Date of Birth: 1980-11-06  Today's Date: 05/01/2024 OT Individual Time: 0945-1100 OT Individual Time Calculation (min): 75 min    Short Term Goals: Week 2:  OT Short Term Goal 1 (Week 2): Pt will roll laterally in bed with Max A x1 to assist with ADL routine. OT Short Term Goal 1 - Progress (Week 2): Progressing toward goal OT Short Term Goal 2 (Week 2): Pt will bathe face with Mod A using compensatory techniques/strategies as needed. OT Short Term Goal 2 - Progress (Week 2): Progressing toward goal OT Short Term Goal 3 (Week 2): Pt will self-feed with Mod A using compensatory techniques/strategies as needed. OT Short Term Goal 3 - Progress (Week 2): Progressing toward goal  Skilled Therapeutic Interventions/Progress Updates:    Skilled OT intervention with focus on RUE function, PWC mobility, w/c positioning, RUE estim for increased function, and safety awareness to increase independence with bADLs. Adjustments made to [facility] by [employer] rep with pt in w/c to facilitate correct positioning of controls. Pt requires assistance for positioning in w/c. Pt with Rt lateral lean but pt states he has always had a tendency to lean to his Rt. PWC mobility in hallway and gym with speed at lowest level. Instructed pt to keep speed at lowest level until such time he can better control PWC.   Saebo Stim One RUE wrist extensors to facilitate increased control and PWC control. Pt responded well but unable to replicate. 330 pulse width 35 Hz pulse rate On 8 sec/ off 8 sec Ramp up/ down 2 sec Symmetrical Biphasic wave form  Max intensity at 500 Ohm load  No adverse reaction noted  Pt reamined in w/c with all needs within reach. Pt able to activate sip n puff   Therapy Documentation Precautions:  Precautions Precautions: Cervical, Fall Precaution/Restrictions Comments: MAP >65 Restrictions Weight  Bearing Restrictions Per Provider Order: No LUE Weight Bearing Per Provider Order: Touch down weight bearing Other Position/Activity Restrictions: Forearm based thumb spica LUE (per ortho); WHO RUE, B PRAFOs   Pain: Pt reports 8/10 RUE/shoulder pain; meds admin prior to therapy and repositioning   Therapy/Group: Individual Therapy  [name] 05/01/2024, 11:26 AM

## 2024-05-02 LAB — CBC
HCT: 32.2 % — ABNORMAL LOW (ref 39.0–52.0)
Hemoglobin: 10.5 g/dL — ABNORMAL LOW (ref 13.0–17.0)
MCH: 29.8 pg (ref 26.0–34.0)
MCHC: 32.6 g/dL (ref 30.0–36.0)
MCV: 91.5 fL (ref 80.0–100.0)
Platelets: 314 K/uL (ref 150–400)
RBC: 3.52 MIL/uL — ABNORMAL LOW (ref 4.22–5.81)
RDW: 12.9 % (ref 11.5–15.5)
WBC: 5.6 K/uL (ref 4.0–10.5)
nRBC: 0 % (ref 0.0–0.2)

## 2024-05-02 LAB — COMPREHENSIVE METABOLIC PANEL WITH GFR
ALT: 80 U/L — ABNORMAL HIGH (ref 0–44)
AST: 31 U/L (ref 15–41)
Albumin: 3.1 g/dL — ABNORMAL LOW (ref 3.5–5.0)
Alkaline Phosphatase: 177 U/L — ABNORMAL HIGH (ref 38–126)
Anion gap: 14 (ref 5–15)
BUN: 15 mg/dL (ref 6–20)
CO2: 27 mmol/L (ref 22–32)
Calcium: 9.3 mg/dL (ref 8.9–10.3)
Chloride: 98 mmol/L (ref 98–111)
Creatinine, Ser: 0.59 mg/dL — ABNORMAL LOW (ref 0.61–1.24)
GFR, Estimated: >60 mL/min (ref >60)
Glucose, Bld: 93 mg/dL (ref 70–99)
Potassium: 4.4 mmol/L (ref 3.5–5.1)
Sodium: 139 mmol/L (ref 135–145)
Total Bilirubin: 0.3 mg/dL (ref 0.0–1.2)
Total Protein: 7.1 g/dL (ref 6.5–8.1)

## 2024-05-02 MED ORDER — DULOXETINE HCL 60 MG PO CPEP
60.0000 mg | ORAL_CAPSULE | Freq: Every day | ORAL | Status: DC
  Administered 2024-05-03 – 2024-05-14 (×12): 60 mg via ORAL
  Filled 2024-05-02 (×12): qty 1

## 2024-05-02 NOTE — Progress Notes (Signed)
 Physical Therapy Session Note  Patient Details  Name: Tyrone Mckinney MRN: [id number] Date of Birth: [birth year]  Today's Date: 05/02/2024 PT Individual Time: 1300-1330 PT Individual Time Calculation (min): 30 min   Short Term Goals: Week 3:  PT Short Term Goal 1 (Week 3): Pt will recall and teach back pressure relief strategies PT Short Term Goal 2 (Week 3): Pt will initiate PWC navigation with assist PT Short Term Goal 3 (Week 3): Pt will direct transfers without cue  Skilled Therapeutic Interventions/Progress Updates:   Pt received supine in bed, agreeable to therapy. Pt reported increased pain in LUE, unrated. Discussion held on family ed and any follow up questions/comments from the AM session. Pants and shoes donned w/ total A. Pt performed R roll w/ maxA for BLE management and trunk management. Side-lying to seated EOB required maxA +2 for BLE management and trunk elevation. Slide board transfer performed w/ maxA +2 to PWC in preparation for OT session. Pt educated on positioning in PWC for pressure relief, put in total reclined position as demonstration. Pt demonstrated inability to lift RUE sufficient to tilt out of full recline, given assist to do so.   At end of session, pt remained upright in PWC, handoff to nsg staff.   Therapy Documentation Precautions:  Precautions Precautions: Cervical, Fall Precaution/Restrictions Comments: MAP >65 Restrictions Weight Bearing Restrictions Per Provider Order: No LUE Weight Bearing Per Provider Order: Touch down weight bearing Other Position/Activity Restrictions: Forearm based thumb spica LUE (per ortho); WHO RUE, B PRAFOs   Therapy/Group: Individual Therapy  Oneil Grumbles 05/02/2024, 2:38 PM

## 2024-05-02 NOTE — Progress Notes (Signed)
 Recreational Therapy Session Note  Patient Details  Name: Tyrone Mckinney MRN: [id number] Date of Birth: 1980/11/29 Today's Date: 05/02/2024  Pain: no c/o Skilled Therapeutic Interventions/Progress Updates: Met with pt today to discuss overall wellness with emphasis on social/emotional health as he is nearing his discharge date.  Pt reports that he is remaining optimistic and positive and shares that he has learned a lot about himself during this time of healing and recovery.  Pt reports feelings positive about discharge and his family's ability and willingness to care for him.  Therapy/Group: Individual Therapy   Monesha Monreal 05/02/2024, 3:22 PM

## 2024-05-02 NOTE — Progress Notes (Signed)
 PROGRESS NOTE   Subjective/Complaints:  Pt reports spasticity better- no spasms that interfered in therapy yesterday.   Just woke him up- he slept really deeply and went ot sleep at 10pm, in spite of football games still being on.   Slept great.  Doesn't remember if had BM last night.  Don't see it documented in Bowel program note- none there nor the in/out recording.   ROS:  Per HPI   Pt denies SOB, abd pain, CP, N/V/C/D, and vision changes    Objective:   No results found.   Recent Labs    05/02/24 0523  WBC 5.6  HGB 10.5*  HCT 32.2*  PLT 314     Recent Labs    05/02/24 0523  NA 139  K 4.4  CL 98  CO2 27  GLUCOSE 93  BUN 15  CREATININE 0.59*  CALCIUM  9.3          Intake/Output Summary (Last 24 hours) at 05/02/2024 0859 Last data filed at 05/02/2024 0631 Gross per 24 hour  Intake 472 ml  Output 850 ml  Net -378 ml         Physical Exam: Vital Signs Blood pressure 106/77, pulse 64, temperature 98.8 F (37.1 C), temperature source Oral, resp. rate 12, height 6' 4 (1.93 m), weight 88.9 kg, SpO2 100%.       General: awake, alert, appropriate, sitting up slightly in bed- daughter at bedside; a little groggy form waking up; NAD HENT: conjugate gaze; oropharynx moist CV: regular rate and rhythm; no JVD Pulmonary: CTA B/L; no W/R/R- good air movement GI: soft, NT, appears slightly protuberant, but could be position; slightly hypoactive BS, but hasn't eaten breakfast Psychiatric: appropriate- quieter, but just woke up Neurological: Ox3  MSK: R leg at mild angle this Am in prevalons- fixed to have kick stand on outside of prevalon  Ext: no clubbing, cyanosis, or edema Psych: pleasant and cooperative  Skin: minimal drainage from lower aspect of incision, sacral wound dressed.  Neurological:     Mental Status: He is alert.     Comments: Alert and oriented x 3. Normal insight and  awareness. Intact Memory. Normal language and speech. Cranial nerve exam unremarkable. MMT: RUE: delt 2+/5, biceps 3- to 3/5, triceps maybe trace, wrist and HI all 0/5.  LUE: delt 1/5, biceps 2/5, triceps, wrist, HI all 0/5. RLE: 1/5 HE and ~1+/5 KE, 0/5 everywhere else. LLE: 2 to 2+/5 HF, HAD and KE, ADF/PF 2+/5. Sensory exam diminished below C5 on right and ~C6 on left. 1/2 sensation in trunk and both LE, as well as inguinal/rectal area. Sensed sl more in LLE. DTR's 1+ in BUE and 3+ in BLE. Pt without resting LE tone. 1-2  beats of clonus in either foot.    Assessment/Plan: 1. Functional deficits which require 3+ hours per day of interdisciplinary therapy in a comprehensive inpatient rehab setting. Physiatrist is providing close team supervision and 24 hour management of active medical problems listed below. Physiatrist and rehab team continue to assess barriers to discharge/monitor patient progress toward functional and medical goals  Care Tool:  Bathing        Body parts bathed by helper:  Buttocks, Right upper leg, Left upper leg, Right lower leg, Left lower leg, Face, Right arm, Chest, Left arm, Abdomen, Front perineal area     Bathing assist Assist Level: Dependent - Patient 0%     Upper Body Dressing/Undressing Upper body dressing   What is the patient wearing?: Pull over shirt    Upper body assist Assist Level: Dependent - Patient 0%    Lower Body Dressing/Undressing Lower body dressing      What is the patient wearing?: Incontinence brief     Lower body assist Assist for lower body dressing: Dependent - Patient 0%     Toileting Toileting    Toileting assist Assist for toileting: Dependent - Patient 0%     Transfers Chair/bed transfer  Transfers assist     Chair/bed transfer assist level: Dependent - mechanical lift     Locomotion Ambulation   Ambulation assist   Ambulation activity did not occur: Safety/medical concerns          Walk 10 feet  activity   Assist  Walk 10 feet activity did not occur: Safety/medical concerns        Walk 50 feet activity   Assist Walk 50 feet with 2 turns activity did not occur: Safety/medical concerns         Walk 150 feet activity   Assist Walk 150 feet activity did not occur: Safety/medical concerns         Walk 10 feet on uneven surface  activity   Assist Walk 10 feet on uneven surfaces activity did not occur: Safety/medical concerns         Wheelchair     Assist Is the patient using a wheelchair?: Yes Type of Wheelchair: Power    Wheelchair assist level: Contact Guard/Touching assist, Minimal Assistance - Patient > 75% Max wheelchair distance: 200+    Wheelchair 50 feet with 2 turns activity    Assist        Assist Level: Minimal Assistance - Patient > 75%   Wheelchair 150 feet activity     Assist      Assist Level: Minimal Assistance - Patient > 75%   Blood pressure 106/77, pulse 64, temperature 98.8 F (37.1 C), temperature source Oral, resp. rate 12, height 6' 4 (1.93 m), weight 88.9 kg, SpO2 100%.  Medical Problem List and Plan: 1. 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 Darnella of [facility] on 03/28/24:             -patient may not yet shower             -ELOS/Goals: 05/14/24.  mod to max assist goals             -No collar needed per Dr. Darnella             -Bilateral PRAFO's, R WHO.  -Will switch PRAFOs to Prevalons due to spasticity D/c date 9/30 Con't CIR PT and OT Strength Gains are slowing down slightly Pt started in power w/c yesterday 2.  Antithrombotics: -DVT/anticoagulation:  Pharmaceutical: Lovenox  30mg  BID 9/1- is (-) for DVT 9/4- needs Elqiuis or Lovenox   for a total of 3 months             -antiplatelet therapy: ASA 81mg  daily 3. Pain  Management: Cymbalta  30mg  nightly, Gabapentin  600mg  TID, Tylenol  1000mg  q6h, Robaxin  1000mg  q8h, oxycodone  10-15mg  q4h prn,  -Consider increasing Cymbalta  to 60mg  nightly for improved pain control if needed -Pt developing early hypertonicity in LE's. Will begin scheduling tizanidine  at 2mg  tid to start. Can have 2mg  tid prn as well -04/13/24 decrease tylenol  650mg  QID d/t transminitis as below 9/1- changed Cymbalta  to qday since had nightmares last night- didn't take trazodone . 9/3- still having nightmares, but not med related- might be PTSD? Vs subconscious dealing with his injury? 9/15- pain is improved. Pt satsified 9/18- will increase Cymbalta  to 60 mg daily- to help mood which I'm concerned about as well as pain 4. Mood/Behavior/Sleep: ego support             -antipsychotic agents: n/a             -Psych consult inpatient, no needs at that time -Pt will need emotional support. Seems to be pretty upbeat. Family sounds supportive. Will have Dr. Corina see pt at some point during admit  9/4- D/w pt and family today that he's very 'bright- but I'm expecting him to hit a wall when all this really hits.  9/17- mood still extremely positive- needs family education done 5. Neuropsych/cognition: This patient is capable of making decisions on his own behalf. 6. Skin/Wound Care/large forehead laceration: staples removed 04/04/24.  Routine skin care.  9/8- incision on posterior neck puffy, draining, called Dr Darnella via NP 7. Fluids/Electrolytes/Nutrition: routine I&O, routine labs, continue vitamins/supplements (Ensure plus high protein BID, MVI)             -Regular diet 8. 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 Lester of [facility], continue ASA 81mg  daily 9. Superior endplate compression fx T5, T7, T8, T9, B/L upper thoracic posterior paraspinal muscle injury and edema, interspinous ligament injury T2-3  and possibly T3-4: no intervention needed. Per Dr. Darnella.  10. B/L apical pulmonary contusions, small pulmonary lac RLL: aggressive pulmonary toilet 11. L Ulnar styloid fx: NWB LUE, splint, f/up Dr. Murrell 9/4- when gets closer to 6 weeks, will call Dr Marjorie to go over when can WBAT 9/9- cannot see a surgery done? So 6 weeks from 8/14- called Dr Marjorie- ordered a wrist xray to f/u- and unlikely to be able to [facility] prior to d/c  12. Orthostatic hypotension: continue Midodrine  20mg  TID, Strattera  25mg  daily also started per Dr. Jyl recs with goal perhaps of coming off midodrine . -continue TEDs and abdominal binder -04/14/24 BPs improving, monitor 9/1-9/2 in general BP's soft, in 100s-110s. Supine, but doing better- con't regimen 9/3-9/4 BP's running low 90's to 110's- no concern about OH currently per therapy -9/6-7/25 BP occasionally elevated briefly but improves, no AD sxs, monitor 9/8- BP 131 systolic this AM! Sitting upright 9/15--bp remains very soft. Continue acclimation with PT 9/16- 9/17- BP still very soft, but doing better in last 24 hours- low 100's systolic- -con't regimen 9/18- BP running low 100's to 130s- doing better- d/w nursing to not hold midodrine /strattera  Vitals:   04/29/24 0301 04/29/24 1200 04/29/24 1536 04/30/24 0316  BP: (!) 98/56 109/76 105/71 106/68   04/30/24 0800 04/30/24 1325 04/30/24 2006 05/01/24 0404  BP: 99/70 114/78 134/86 112/80   05/01/24 1100 05/01/24 1525 05/01/24 1955 05/02/24 0416  BP: 133/87 118/75 107/74 106/77    13. Neurogenic bowel: on  bowel program: Dulcolax suppository 10mg  daily, Miralax  BID, SenokotS 1 tab BID             -  will adjust bowel program to 1800 daily             -senokot-s 2 tabs q am and miralax  BID  9/5- no BM last night and none with another bowel program this AM in spite of being done-- will give Sorbitol  60cc now and then SSE this evening with bowel program- ordered -04/21/24 small BM last night-- monitor 9/9- Will give  60cc Sorbitol  at 2pm and SE at 6pm at part of bowel program- cannot add Linzess since isn't insured- might need at home to take Mg citrate every few days- also taking a lot of Oxycodone  as well - hopefully this will decrease over time.  9/11 having response to bowel program but stool too soft  -hold miralax  bid and 3 senna-s's daily for now  -resume them in some form tomorrow 9/12 formed stool last night, resume daily miralax  tomorrow and 2 senna-s tabs today -9/15 had a large T6 bm at 0100 this am 9/16- Had large BM overnight with bowel program 9/17- had large BM at 11:45 pm- 9/18- no BM documented, but unclear if pt had bowel program last night- will d/w nursing   14. Neurogenic bladder: continue ISC q6h             -adjust I/O cath schedule to volumes of 300-500 cc -04/13/24 apparently used condom cath last night-- does feel urge to urinate, no bladder scans documented but pt says they were done but no ISC needed -Reviewed with nursing the plan for him, no condom cath for now, attempt with urinal q4-6h and bladder scan -ISC if scan >250cc 9/4- still not voiding with toileting 9/5- I still think he's kicking off, but not emptying- he has no feeling of voiding, just leaking-- same 9/6-7, no caths still, but incontinent 9/12-15 pt has been incontinent with low to medium PVR's. He insists that he can feel when he needs to empty and that staff can't get to him in time. He has mentioned not wanting to bother nursing so much.  -continue timed voids q3-4 hours and obsv  -encouraged pt to notify staff when he needs to empty -continue voiding trial/education 9/16- Pt says staff cannot get to him, but mentions the time is very short- like 1-2 minutes at times- of note- con't regimen 9/17-9/18 still kicking off- incontinent 15. Polysubstance use disorder: UDS +cocaine, THC, Benzos-- provide counseling 16. 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 F/up outpatient.  17. ABLA: hgb stable 8-9 range, monitor twice weekly  -04/13/24 Hgb 9.6, monitor>> stable 9/4 at 10 18. Transaminitis: -04/13/24 Alk phos 286, AST 58, ALT 153-- could be from shock liver, monitor again on Monday. Decrease tylenol  to 650mg  QID. No abd pain/n/v but consider imaging/work up if those develop.  9/1-  Down to 42 and 119 from 25 and 153- will reduction in Tylenol - -if not better Thursday, will reduce Tylenol  further 9/4- will stop scheduled tylenol  and make 325 mg q6 hours prn if need be 9/8- AST/ALT about the same- 48/110- and alk phos 213 (alk phos is down substantially)- if not down by Thursday, will reassess 9/9- cannot start Dantrolene with these issues 9/16- AST 42 up from 29 and ALT 95 up from 74- will recheck Thursday- because not taking tylenol - could be baclofen , but I don't know what else can can do for spasticity? 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 - pt had BP yesterday of 166/92- pt admits had HA- so had AD- educated pt and family on AD- will continue to educate staff, because we were not called on the probable AD episode yesterday- 166/92 was BP- isolated spike in BP = AD esp in pt that has OH- went over with PA and staff about how to treat- sit pt up, loosen tight clothing, then if doesn't improve, cath him; do bowel program- that's 80% of all AD causes- if not improved, let PA/Doctor know 9/5- Had BP of 177/92 this AM- likely AD, although no HA- nurse did great catch and tried to do bowel program- no results- will order another round of Sorbitol  60cc with SSE this evening to get him cleaned out- esp with constipation -04/20/24 occasionally elevated BP but improves quickly, no AD symptoms, monitor 9/9- had AD yesterday AM- no HA or nasal  congestions but BP -9/12-15 no further AD. Having daily results with bowel program.    20. Spasticity-  9/5- will increase Baclofen  to 10 mg TID_ and first dose at 630am- d/w nursing and therapy that spasticity is progressing- once it starts, it's pretty fast initially- usually starts around 3-4 weeks and progresses, as I educated family about- and unfortunately we will try to keep up, but it's hard- he also has Zanaflex  2 mg TID AND 2 mg TID prn for additional spasms 9/8- has improved spasticity-  9/9- spasms much worse this AM- like didn't get meds, but per chart, received Baclofen  at 546 this AM- will increase Zanaflex  to 4 mg TID- it's progressing so fast- not sure due to inflammation from mild incision infection- on keflex - can set it off 9/12-15 continue above schedule. Spasticity is MUCH improved compared to admit 9/16-9/18- spasticity doing better per pt and OT 21. 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 If so, will order SLP eval.  22. Wound abscess- in superficial tissues, not actual deep wound infection per Dr Darnella 9/9-11- NSU saw and put on keflex  for 7 days- will con't to monitor- is still draining- - per pt, Dr Darnella mentioned that if things weren't better at day 7 (9/15 at latest) that to call him back.   9/12-15 wound/drainage appear to be improving--continue with above plan   I spent a total of  41  minutes on total care today- >50% coordination of care- due to  D/w pt about Sx's- as well as nursing x2 about Bowel program- also reviewed labs and ordered more.   LOS: 20 days A FACE TO FACE EVALUATION WAS PERFORMED  Tyrone Mckinney 05/02/2024, 8:59 AM

## 2024-05-02 NOTE — Progress Notes (Signed)
 PT refused Bowel program this PM stating I already went today. I ain't tryna do all that tonight. Educated on importance of bowel program.    Tyrone Mckinney  VEAR Miyamoto, LPN

## 2024-05-02 NOTE — Progress Notes (Signed)
 Occupational Therapy Session Note  Patient Details  Name: Tyrone Mckinney MRN: [id number] Date of Birth: 12-04-1980  Today's Date: 05/02/2024 OT Individual Time: 9169-9079 OT Individual Time Calculation (min): 50 min  Today's Date: 05/02/2024 OT Individual Time: 1350- 1515  OT Individual Time Calculation (min):    Short Term Goals: Week 1:  OT Short Term Goal 1 (Week 1): Pt will roll laterally in bed with Max A x1 to assist with ADL routine. OT Short Term Goal 1 - Progress (Week 1): Progressing toward goal OT Short Term Goal 2 (Week 1): Pt will bathe face with Mod A using compensatory techniques/strategies as needed. OT Short Term Goal 2 - Progress (Week 1): Progressing toward goal OT Short Term Goal 3 (Week 1): Pt will self-feed with Mod A using compensatory techniques/strategies as needed. OT Short Term Goal 3 - Progress (Week 1): Progressing toward goal Week 2:  OT Short Term Goal 1 (Week 2): Pt will roll laterally in bed with Max A x1 to assist with ADL routine. OT Short Term Goal 1 - Progress (Week 2): Progressing toward goal OT Short Term Goal 2 (Week 2): Pt will bathe face with Mod A using compensatory techniques/strategies as needed. OT Short Term Goal 2 - Progress (Week 2): Progressing toward goal OT Short Term Goal 3 (Week 2): Pt will self-feed with Mod A using compensatory techniques/strategies as needed. OT Short Term Goal 3 - Progress (Week 2): Progressing toward goal Week 3:  OT Short Term Goal 1 (Week 3): Pt will roll laterally in bed with Max A x1 to assist with ADL routine. OT Short Term Goal 2 (Week 3): Pt will self-feed with Mod A using compensatory techniques/strategies as needed. OT Short Term Goal 3 (Week 3): Pt will bathe face with Mod A using compensatory techniques/strategies as needed. OT Short Term Goal 4 (Week 3): Pt will maintain sitting balance EOB with max A in preparation for TB transfers  Skilled Therapeutic Interventions/Progress Updates:     1:1 Pt received in the bed. Pt's wife and oldest daughter present for family education session. Discussion and education about w/c consultation for custom power chair and how it will get transported home, transportation options once home through services or custom vehicle, recommendations for hospital bed for rails and to lessen burden of care for caregiver for ADLs to be done at bed level, introduced manual hoyer lift (would recommend u sling) for transfers with 2 ppl and demonstrated rolling in the bed and placement of lift pad along with LB clothing management, and then transfer to power chair in a tilted position, and UB dressing in chair. Pt able to drive current chair with goal post but requires A to turn on or off or change positioning of chair with its current setup.   Wife ot be back tomorrow for continued fam education. Hand off to next therapist (PT).  2nd session. Pt received int the power w/c. Pt able to self drive down to the dayroom on slower speed for safety. Pt tranfered with max A +2 w/c to mat with ability to maintain forward position over therapist. Focus on static sitting  balance and being able to shift hands (right hand) from positioned on the mat next to him to proped slightly behind him to his lap with contact guard to close supervision. Pt transitioned into sidelying on right side and then supine. Addressed rolling with LEs already in flexed position on the mat using momentum to help roll. With legs in position  and stabilized pt able to roll from supine to the left with contact guard and min A to the left. Pt able to adduct and abduct LEs left >right with LEs knee bent with feet on the mat with stabilization.  Hands washed and lotion applied to both hands. Pt skin washed and assessed under the left hand brace and gauze sleeve place between skin and then brace. Wrist and fingers swollen - adjusted tightness of brace. Will continue to assess. Total A +2 back into sitting position at EOC.  Pt able to maintain static sitting with close supervision. Pt transferred back into power chair with max A +2 for safety. Pt drove back to the room. RN changed neck bandage and assisted with changing shirt with max A with focus of using base of hand to push material down arm to doff and up to donn. Doffing was easier than donning.   Pt left sitting up in chair with sip and puff call bell in place.    Therapy Documentation Precautions:  Precautions Precautions: Cervical, Fall Precaution/Restrictions Comments: MAP >65 Restrictions Weight Bearing Restrictions Per Provider Order: No LUE Weight Bearing Per Provider Order: Touch down weight bearing Other Position/Activity Restrictions: Forearm based thumb spica LUE (per ortho); WHO RUE, B PRAFOs G Pain: No reports of pain - 1st session   2nd session reports pain in left hand - repositioned, adjusted brace and received pain meds in session   Therapy/Group: Individual Therapy  Claudene Nest [facility] 05/02/2024, 7:56 PM

## 2024-05-02 NOTE — Plan of Care (Signed)
  Problem: Consults Goal: RH SPINAL CORD INJURY PATIENT EDUCATION Description:  See Patient Education module for education specifics.  Outcome: Progressing   Problem: SCI BOWEL ELIMINATION Goal: RH STG MANAGE BOWEL WITH ASSISTANCE Description: STG Manage Bowel with max Assistance. Outcome: Progressing   Problem: SCI BLADDER ELIMINATION Goal: RH STG MANAGE BLADDER WITH ASSISTANCE Description: STG Manage Bladder With max Assistance Outcome: Progressing   Problem: RH SKIN INTEGRITY Goal: RH STG SKIN FREE OF INFECTION/BREAKDOWN Description: Manage skin free of infection with max assistance Outcome: Progressing   Problem: RH SAFETY Goal: RH STG ADHERE TO SAFETY PRECAUTIONS W/ASSISTANCE/DEVICE Description: STG Adhere to Safety Precautions With max  Assistance/Device. Outcome: Progressing   Problem: RH PAIN MANAGEMENT Goal: RH STG PAIN MANAGED AT OR BELOW PT'S PAIN GOAL Description: <4 w/ prns Outcome: Progressing   Problem: RH KNOWLEDGE DEFICIT SCI Goal: RH STG INCREASE KNOWLEDGE OF SELF CARE AFTER SCI Description: Manage increase knowledge of self care after SCI with max assistance from spouse/ family using educational materials  provided.  Outcome: Progressing

## 2024-05-02 NOTE — Progress Notes (Signed)
 Physical Therapy Session Note  Patient Details  Name: Tyrone Mckinney MRN: [id number] Date of Birth: 02-26-1981  Today's Date: 05/02/2024 PT Individual Time: 0920-1005 PT Individual Time Calculation (min): 45 min   Short Term Goals: Week 3:  PT Short Term Goal 1 (Week 3): Pt will recall and teach back pressure relief strategies PT Short Term Goal 2 (Week 3): Pt will initiate PWC navigation with assist PT Short Term Goal 3 (Week 3): Pt will direct transfers without cue  Skilled Therapeutic Interventions/Progress Updates:    Recd in PWC with daughter Tyrone Mckinney present for family ed. Session focused on family ed on manual hoyer transfers. Continued education from OT on hoyer lift, with Makayla able to lead transfer x2 with mod-max cueing for sequencing and safety(brakes,etc) fading to min for best practice and easiest methods. Makayla demoed great safety awareness and asked good questions throughout. Therapist also demoed and provided verbal instruction on slideboard transfer with plan to trial at a later date. Pt remained in bed at end of session with needs in reach and his daughter present.   Therapy Documentation Precautions:  Precautions Precautions: Cervical, Fall Precaution/Restrictions Comments: MAP >65 Restrictions Weight Bearing Restrictions Per Provider Order: No LUE Weight Bearing Per Provider Order: Touch down weight bearing Other Position/Activity Restrictions: Forearm based thumb spica LUE (per ortho); WHO RUE, B PRAFOs General:      Therapy/Group: Individual Therapy  [name] 05/02/2024, 10:41 AM

## 2024-05-02 NOTE — Progress Notes (Signed)
 This nurse was informed by the charge nurse today that an unspecified Dr. Was asking about a dressing change on this patient's neck. There are currently no orders or documentation regarding this wound. Called Dr. Cornelio MD to clarify and was instructed to do a dry dressing daily.    Tyrone Mckinney  VEAR Miyamoto, LPN

## 2024-05-03 NOTE — Progress Notes (Signed)
 PROGRESS NOTE   Subjective/Complaints:  Pt denies spasms today, upper half is sore from doing more work in therapy. Family all at bedside getting education on transfers with hoyer.   ROS: Patient denies fever, rash, sore throat, blurred vision, dizziness, nausea, vomiting, diarrhea, cough, shortness of breath or chest pain,   headache, or mood change.     Objective:   No results found.   Recent Labs    05/02/24 0523  WBC 5.6  HGB 10.5*  HCT 32.2*  PLT 314     Recent Labs    05/02/24 0523  NA 139  K 4.4  CL 98  CO2 27  GLUCOSE 93  BUN 15  CREATININE 0.59*  CALCIUM  9.3          Intake/Output Summary (Last 24 hours) at 05/03/2024 1230 Last data filed at 05/03/2024 0140 Gross per 24 hour  Intake 100 ml  Output 350 ml  Net -250 ml         Physical Exam: Vital Signs Blood pressure (!) 95/57, pulse 73, temperature 98.5 F (36.9 C), temperature source Oral, resp. rate 17, height 6' 4 (1.93 m), weight 88.9 kg, SpO2 97%.       Constitutional: No distress . Vital signs reviewed. HEENT: NCAT, EOMI, oral membranes moist Neck: supple Cardiovascular: RRR without murmur. No JVD    Respiratory/Chest: CTA Bilaterally without wheezes or rales. Normal effort    GI/Abdomen: BS +, non-tender, non-distended Ext: no clubbing, cyanosis, or edema Psych: pleasant and cooperative  MSK: generalized tenderness to palpation in shoulders/upper arms with bed mobility and AROM/PROM Skin: minimal drainage from lower aspect of incision, sacral wound dressed.  Neurological:     Mental Status: He is alert.     Comments: Alert and oriented x 3. Normal insight and awareness. Intact Memory. Normal language and speech. Cranial nerve exam unremarkable. MMT: RUE: delt 2+/5, biceps 3- to 3/5, triceps maybe trace, wrist and HI all 0/5.  LUE: delt 1/5, biceps 2/5, triceps, wrist, HI all 0/5. RLE: 1/5 HE and ~1+/5 KE, 0/5 everywhere  else. LLE: 2 to 2+/5 HF, HAD and KE, ADF/PF 2+/5. Sensory exam diminished below C5 on right and ~C6 on left. 1/2 sensation in trunk and both LE, as well as inguinal/rectal area. Sensed sl more in LLE. DTR's 1+ in BUE and 3+ in BLE. Pt without resting BLE tone.  Perhaps a beat or two of clonus in either foot.    Assessment/Plan: 1. Functional deficits which require 3+ hours per day of interdisciplinary therapy in a comprehensive inpatient rehab setting. Physiatrist is providing close team supervision and 24 hour management of active medical problems listed below. Physiatrist and rehab team continue to assess barriers to discharge/monitor patient progress toward functional and medical goals  Care Tool:  Bathing        Body parts bathed by helper: Buttocks, Right upper leg, Left upper leg, Right lower leg, Left lower leg, Face, Right arm, Chest, Left arm, Abdomen, Front perineal area     Bathing assist Assist Level: Dependent - Patient 0%     Upper Body Dressing/Undressing Upper body dressing   What is the patient wearing?: Pull over shirt  Upper body assist Assist Level: Dependent - Patient 0%    Lower Body Dressing/Undressing Lower body dressing      What is the patient wearing?: Incontinence brief     Lower body assist Assist for lower body dressing: Dependent - Patient 0%     Toileting Toileting    Toileting assist Assist for toileting: Dependent - Patient 0%     Transfers Chair/bed transfer  Transfers assist     Chair/bed transfer assist level: Dependent - mechanical lift     Locomotion Ambulation   Ambulation assist   Ambulation activity did not occur: Safety/medical concerns          Walk 10 feet activity   Assist  Walk 10 feet activity did not occur: Safety/medical concerns        Walk 50 feet activity   Assist Walk 50 feet with 2 turns activity did not occur: Safety/medical concerns         Walk 150 feet activity   Assist  Walk 150 feet activity did not occur: Safety/medical concerns         Walk 10 feet on uneven surface  activity   Assist Walk 10 feet on uneven surfaces activity did not occur: Safety/medical concerns         Wheelchair     Assist Is the patient using a wheelchair?: Yes Type of Wheelchair: Power    Wheelchair assist level: Contact Guard/Touching assist, Minimal Assistance - Patient > 75% Max wheelchair distance: 200+    Wheelchair 50 feet with 2 turns activity    Assist        Assist Level: Minimal Assistance - Patient > 75%   Wheelchair 150 feet activity     Assist      Assist Level: Minimal Assistance - Patient > 75%   Blood pressure (!) 95/57, pulse 73, temperature 98.5 F (36.9 C), temperature source Oral, resp. rate 17, height 6' 4 (1.93 m), weight 88.9 kg, SpO2 97%.  Medical Problem List and Plan: 1. 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 Darnella of [facility] on 03/28/24:             -patient may not yet shower             -ELOS/Goals: 05/14/24.  mod to max assist goals             -No collar needed per Dr. Darnella             -Bilateral PRAFO's, R WHO.  -Will switch PRAFOs to Prevalons due to spasticity D/c date 9/30, power w/c education -Continue CIR therapies including PT, OT, family ed 2.  Antithrombotics: -DVT/anticoagulation:  Pharmaceutical: Lovenox  30mg  BID 9/1- is (-) for DVT 9/4- needs Elqiuis or Lovenox  for a total of 3 months             -antiplatelet therapy: ASA 81mg  daily 3. Pain Management: Cymbalta  30mg  nightly, Gabapentin  600mg  TID, Tylenol  1000mg  q6h, Robaxin  1000mg  q8h, oxycodone  10-15mg  q4h prn,  -Consider increasing Cymbalta  to 60mg  nightly for improved pain  control if needed -Pt developing early hypertonicity in LE's. Will begin scheduling tizanidine  at 2mg  tid to start. Can have 2mg  tid prn as well -04/13/24 decrease tylenol  650mg  QID d/t transminitis as below 9/1- changed  Cymbalta  to qday since had nightmares last night- didn't take trazodone . 9/3- still having nightmares, but not med related- might be PTSD? Vs subconscious dealing with his injury? 9/15- pain is improved. Pt satsified 9/18- will increase Cymbalta  to 60 mg daily- to help mood which I'm concerned about as well as pain 9/19 patient reported upper body soreness today. Told me he was satisfied with pain mgt currently 4. Mood/Behavior/Sleep: ego support             -antipsychotic agents: n/a             -Psych consult inpatient, no needs at that time -Pt will need emotional support. Seems to be pretty upbeat. Family sounds supportive. Will have Dr. Corina see pt at some point during admit  9/4- D/w pt and family today that he's very 'bright- but I'm expecting him to hit a wall when all this really hits.  9/19 cymbalta  increased to 60mg  as above 5. Neuropsych/cognition: This patient is capable of making decisions on his own behalf. 6. Skin/Wound Care/large forehead laceration: staples removed 04/04/24.  Routine skin care.  9/8- incision on posterior neck puffy, draining, called Dr Darnella via NP 7. Fluids/Electrolytes/Nutrition: routine I&O, routine labs, continue vitamins/supplements (Ensure plus high protein BID, MVI)             -Regular diet 8. 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 Lester of [facility], continue ASA 81mg  daily 9. Superior endplate compression fx T5, T7, T8, T9, B/L upper thoracic posterior paraspinal muscle injury and edema, interspinous ligament injury T2-3 and possibly T3-4: no intervention needed. Per Dr. Darnella.  10. B/L apical pulmonary contusions, small pulmonary lac RLL: aggressive pulmonary toilet 11. L Ulnar styloid fx: NWB LUE, splint, f/up Dr.  Murrell 9/4- when gets closer to 6 weeks, will call Dr Marjorie to go over when can WBAT 9/9- cannot see a surgery done? So 6 weeks from 8/14- called Dr Marjorie- ordered a wrist xray to f/u- and unlikely to be able to [facility] prior to d/c  12. Orthostatic hypotension: continue Midodrine  20mg  TID, Strattera  25mg  daily also started per Dr. Jyl recs with goal perhaps of coming off midodrine . -continue TEDs and abdominal binder -04/14/24 BPs improving, monitor 9/1-9/2 in general BP's soft, in 100s-110s. Supine, but doing better- con't regimen 9/3-9/4 BP's running low 90's to 110's- no concern about OH currently per therapy -9/6-7/25 BP occasionally elevated briefly but improves, no AD sxs, monitor 9/8- BP 131 systolic this AM! Sitting upright 9/15--bp remains very soft. Continue acclimation with PT 9/16- 9/17- BP still very soft, but doing better in last 24 hours- low 100's systolic- -con't regimen 9/18-19- BP's soft but stable  -continue midodrine  and strattera  Vitals:   04/29/24 1536 04/30/24 0316 04/30/24 0800 04/30/24 1325  BP: 105/71 106/68 99/70 114/78   04/30/24 2006 05/01/24 0404 05/01/24 1100 05/01/24 1525  BP: 134/86 112/80 133/87 118/75   05/01/24 1955 05/02/24 0416 05/02/24 1341 05/03/24 0413  BP: 107/74 106/77 (!) 149/100 (!) 95/57    13. Neurogenic bowel: on  bowel program: Dulcolax suppository 10mg  daily, Miralax  BID, SenokotS 1 tab BID             -will adjust bowel program to 1800 daily             -senokot-s 2 tabs q am and miralax  BID  9/5- no BM last night and none with another bowel program this AM in spite of being done-- will give Sorbitol  60cc now  and then SSE this evening with bowel program- ordered -04/21/24 small BM last night-- monitor 9/9- Will give 60cc Sorbitol  at 2pm and SE at 6pm at part of bowel program- cannot add Linzess since isn't insured- might need at home to take Mg citrate every few days- also taking a lot of Oxycodone  as well - hopefully this will decrease  over time.  9/11 having response to bowel program but stool too soft  -hold miralax  bid and 3 senna-s's daily for now  -resume them in some form tomorrow 9/12 formed stool last night, resume daily miralax  tomorrow and 2 senna-s tabs today -9/15 had a large T6 bm at 0100 this am 9/16- Had large BM overnight with bowel program 9/17- had large BM at 11:45 pm- 9/18-19 no bm or bowel program documented---I am follow up with nursing. Needs to be on program 14. Neurogenic bladder: continue ISC q6h             -adjust I/O cath schedule to volumes of 300-500 cc -04/13/24 apparently used condom cath last night-- does feel urge to urinate, no bladder scans documented but pt says they were done but no ISC needed -Reviewed with nursing the plan for him, no condom cath for now, attempt with urinal q4-6h and bladder scan -ISC if scan >250cc 9/4- still not voiding with toileting 9/5- I still think he's kicking off, but not emptying- he has no feeling of voiding, just leaking-- same 9/6-7, no caths still, but incontinent 9/12-15 pt has been incontinent with low to medium PVR's. He insists that he can feel when he needs to empty and that staff can't get to him in time. He has mentioned not wanting to bother nursing so much.  -continue timed voids q3-4 hours and obsv  -encouraged pt to notify staff when he needs to empty -continue voiding trial/education 9/16- Pt says staff cannot get to him, but mentions the time is very short- like 1-2 minutes at times- of note- con't regimen 9/17-9/19 still kicking off, regularly incontinent. Consider antispasmodic and I/O caths vs condom cath/bag.  15. Polysubstance use disorder: UDS +cocaine, THC, Benzos-- provide counseling 16. 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 F/up outpatient.  17. ABLA: hgb stable  8-9 range, monitor twice weekly  -04/13/24 Hgb 9.6, monitor>> stable 9/4 at 10 18. Transaminitis: -04/13/24 Alk phos 286, AST 58, ALT 153-- could be from shock liver, monitor again on Monday. Decrease tylenol  to 650mg  QID. No abd pain/n/v but consider imaging/work up if those develop.  9/1-  Down to 62 and 119 from 58 and 153- will reduction in Tylenol - -if not better Thursday, will reduce Tylenol  further 9/4- will stop scheduled tylenol  and make 325 mg q6 hours prn if need be 9/8- AST/ALT about the same- 48/110- and alk phos 213 (alk phos is down substantially)- if not down by Thursday, will reassess 9/9- cannot start Dantrolene with these issues 9/16- AST 42 up from 29 and ALT 95 up from 74- will recheck Thursday- because not taking tylenol - could be baclofen , but I don't know what else can can do for spasticity? 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 - pt had BP yesterday of 166/92- pt admits had HA- so had AD- educated pt and family on AD- will  continue to educate staff, because we were not called on the probable AD episode yesterday- 166/92 was BP- isolated spike in BP = AD esp in pt that has OH- went over with PA and staff about how to treat- sit pt up, loosen tight clothing, then if doesn't improve, cath him; do bowel program- that's 80% of all AD causes- if not improved, let PA/Doctor know 9/5- Had BP of 177/92 this AM- likely AD, although no HA- nurse did great catch and tried to do bowel program- no results- will order another round of Sorbitol  60cc with SSE this evening to get him cleaned out- esp with constipation -04/20/24 occasionally elevated BP but improves quickly, no AD symptoms, monitor 9/9- had AD yesterday AM- no HA or nasal congestions but BP -9/12-15 no further AD. Having daily results with bowel program.    20. Spasticity-  9/5- will increase Baclofen  to 10 mg TID_ and first dose at  630am- d/w nursing and therapy that spasticity is progressing- once it starts, it's pretty fast initially- usually starts around 3-4 weeks and progresses, as I educated family about- and unfortunately we will try to keep up, but it's hard- he also has Zanaflex  2 mg TID AND 2 mg TID prn for additional spasms 9/8- has improved spasticity-  9/9- spasms much worse this AM- like didn't get meds, but per chart, received Baclofen  at 546 this AM- will increase Zanaflex  to 4 mg TID- it's progressing so fast- not sure due to inflammation from mild incision infection- on keflex - can set it off 9/12-15 continue above schedule. Spasticity is MUCH improved compared to admit 9/16-9/19- spasticity much improved 21. 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 If so, will order SLP eval.  22. Wound abscess- in superficial tissues, not actual deep wound infection per Dr Darnella 9/9-11- NSU saw and put on keflex  for 7 days- will con't to monitor- is still draining- - per pt, Dr Darnella mentioned that if things weren't better at day 7 (9/15 at latest) that to call him back.   9/12-15 wound/drainage appear to be improving--continue with above plan      LOS: 21 days A FACE TO FACE EVALUATION WAS PERFORMED  Tyrone Mckinney 05/03/2024, 12:30 PM

## 2024-05-03 NOTE — Progress Notes (Signed)
 Physical Therapy Session Note  Patient Details  Name: Tyrone Mckinney MRN: [id number] Date of Birth: 10-Feb-1981  Today's Date: 05/03/2024 PT Individual Time: 0800-0900,1300-1415 PT Individual Time Calculation (min): 60 min, 75 min   Short Term Goals: Week 3:  PT Short Term Goal 1 (Week 3): Pt will recall and teach back pressure relief strategies PT Short Term Goal 2 (Week 3): Pt will initiate PWC navigation with assist PT Short Term Goal 3 (Week 3): Pt will direct transfers without cue  Skilled Therapeutic Interventions/Progress Updates:    Session 1: pt received in bed and agreeable to therapy. No complaint of pain. Pt on purewick but states he still needs to urinate. Pt assisted with suprapubic tapping to facilitate voiding with some success. Removed purewick at this time and changed brief/donned pants with tot a. Pt later reported further urge to void so assisted with urinal. Pt's wife arrived at this time for family ed. Focused on verbal education d/t changing and voiding. Discussed outpatient follow up including pro bono physical therapy clinics. Discussed transportation options with PWC and lengthy discussion about need for bowel program long term, typical schedules used, and importance of performing regularly to prevent complications. Pt remained in bed with family present at bedside and handed off care to OT.   Session 2: pt received in PWC and agreeable to therapy. No complaint of pain. Pt navigated PWC with assist to place hand on joystick. Pt is still unable to turn chair on/off or fully perform pressure relief. Instructed pt on using joystick to navigate chair functions to set up for transfers. Pt performed slideboard transfer with max x 1 and min from + 2. Session focused on education on PWC/pressure relief and core strength in sitting. While sitting on mat table, pt able to maintain static sitting balance with hands in lap with supervision >30 sec! Pt participated in sit ups from  physioball x 10. Cued to use strong exhale to improve core activation. Pt required cues to continue breathing as often found to be holding his breath. Pt reported dizziness which resolved with drinking water and rest. Vitals WNL. Returned to slideboard transfer in same manner, with improving ability to use LLE to assist with transfer. Pt returned to room driving with seat elevator raised, stating this feels better to him. Pt assisted with pressure relief x 3 min before returning to sit up in chair, was left with needs in reach and sip and puff in place.   Therapy Documentation Precautions:  Precautions Precautions: Cervical, Fall Precaution/Restrictions Comments: MAP >65 Restrictions Weight Bearing Restrictions Per Provider Order: No LUE Weight Bearing Per Provider Order: Touch down weight bearing Other Position/Activity Restrictions: Forearm based thumb spica LUE (per ortho); WHO RUE, B PRAFOs General:       Therapy/Group: Individual Therapy  Tyrone Mckinney 05/03/2024, 12:58 PM

## 2024-05-03 NOTE — Progress Notes (Signed)
 IP Rehab Bowel Program Documentation   Bowel Program Start time 1800  Dig Stim Indicated? Yes  Dig Stim Prior to Suppository or mini Enema X 5   Output from dig stim: Moderate  Ordered intervention: Suppository Yes , mini enema No ,   Repeat dig stim after Suppository or Mini enema  X 5  Output? Minimal tba  Bowel Program Complete? no, handoff given yes  Patient Tolerated? Yes

## 2024-05-03 NOTE — Plan of Care (Signed)
  Problem: Consults Goal: RH SPINAL CORD INJURY PATIENT EDUCATION Description:  See Patient Education module for education specifics.  Outcome: Progressing   Problem: SCI BOWEL ELIMINATION Goal: RH STG MANAGE BOWEL WITH ASSISTANCE Description: STG Manage Bowel with max Assistance. Outcome: Progressing   Problem: SCI BLADDER ELIMINATION Goal: RH STG MANAGE BLADDER WITH ASSISTANCE Description: STG Manage Bladder With max Assistance Outcome: Progressing   Problem: RH SKIN INTEGRITY Goal: RH STG SKIN FREE OF INFECTION/BREAKDOWN Description: Manage skin free of infection with max assistance Outcome: Progressing   Problem: RH SAFETY Goal: RH STG ADHERE TO SAFETY PRECAUTIONS W/ASSISTANCE/DEVICE Description: STG Adhere to Safety Precautions With max  Assistance/Device. Outcome: Progressing   Problem: RH PAIN MANAGEMENT Goal: RH STG PAIN MANAGED AT OR BELOW PT'S PAIN GOAL Description: <4 w/ prns Outcome: Progressing   Problem: RH KNOWLEDGE DEFICIT SCI Goal: RH STG INCREASE KNOWLEDGE OF SELF CARE AFTER SCI Description: Manage increase knowledge of self care after SCI with max assistance from spouse/ family using educational materials  provided.  Outcome: Progressing

## 2024-05-03 NOTE — Consult Note (Signed)
 WOC Nurse Consult Note: Reason for Consult: pressure injuries found during pressure injury skin assessment  Wound type: Stage 3 Pressure Injury; sacrum; 100% clean Deep Tissue Pressure Injury; left heel; 100% dark purple non blanchable tissue Pressure Injury POA: No Measurement: see nursing flow sheets Wound bed:see above  Drainage (amount, consistency, odor) see nursing flowsheets Periwound: intact  Dressing procedure/placement/frequency: Offload heels with Prevalon boots Cover heel wound with xeroform and foam, change xeroform daily, ok to lift foam to assess and reapply foam  Foam dressing to the sacrum per the nursing skin care protocol Low air loss mattress for moisture management and pressure redistribution  Zania Kalisz [facility], CNS, CWON-AP [phone]

## 2024-05-03 NOTE — Progress Notes (Signed)
 Occupational Therapy Session Note  Patient Details  Name: Tyrone Mckinney MRN: [id number] Date of Birth: 05/16/1981  Today's Date: 05/03/2024 OT Individual Time: 0900-1000 OT Individual Time Calculation (min): 60 min    Short Term Goals: Week 3:  OT Short Term Goal 1 (Week 3): Pt will roll laterally in bed with Max A x1 to assist with ADL routine. OT Short Term Goal 2 (Week 3): Pt will self-feed with Mod A using compensatory techniques/strategies as needed. OT Short Term Goal 3 (Week 3): Pt will bathe face with Mod A using compensatory techniques/strategies as needed. OT Short Term Goal 4 (Week 3): Pt will maintain sitting balance EOB with max A in preparation for TB transfers  Skilled Therapeutic Interventions/Progress Updates:    Pt received resting in bed with wife and 2 daughters presenting to be in good spirits receptive to skilled OT session reporting pain with no numerical value- OT offering intermittent rest breaks, repositioning, and therapeutic support to optimize participation in therapy session. Pt's wife and daughters present in room upon OT arrival for family education focused therapy session. Provided education on SCI recovery process, simple home modifications, transfers, and proper bed/WC positioning to increase Pt safety. Educated on having 2 people for transfers at all times and to complete ADLs, such as bathing at bed level. Discussed how ADLs/transfers can progress in the future. Pt's family previously training with manual hoyer lift, however family reports they will be using electric hoyer lift. Educated on how to place sling using rolling technique with 2 person assist with family demonstrating tech back as evidence of learning. Engaged Pt's wife and daughters in assisting with hands on transfers following demonstration and education on technique for electric hoyer transfers with emphasis on simple cues to provide, body mechanics to prevent injury, and OT providing feedback  following with education provided on Pt's ability to guide his care and his preferences. Pt's family members demonstrating appropriate insight into Pt's deficits and demonstrating teach back as evidence of learning. Pt and Pt's family members reporting all questions were answered at end of session. Pt feels prepared to d/c on 09/30. Pt's family would benefit from continued education and training to ensure safety upon d/c. Pt was left resting in [facility] with call bell in reach, family present, and all needs met.    Therapy Documentation Precautions:  Precautions Precautions: Cervical, Fall Precaution/Restrictions Comments: MAP >65 Restrictions Weight Bearing Restrictions Per Provider Order: No LUE Weight Bearing Per Provider Order: Touch down weight bearing Other Position/Activity Restrictions: Forearm based thumb spica LUE (per ortho); WHO RUE, B PRAFOs  Therapy/Group: Individual Therapy  Paulina Fleeta Dixie 05/03/2024, 10:20 AM

## 2024-05-04 NOTE — Progress Notes (Signed)
 Occupational Therapy Session Note  Patient Details  Name: Tyrone Mckinney MRN: [id number] Date of Birth: May 14, 1981  Today's Date: 05/04/2024 OT Individual Time: 1300-1345 OT Individual Time Calculation (min): 45 min    Short Term Goals: Week 3:  OT Short Term Goal 1 (Week 3): Pt will roll laterally in bed with Max A x1 to assist with ADL routine. OT Short Term Goal 2 (Week 3): Pt will self-feed with Mod A using compensatory techniques/strategies as needed. OT Short Term Goal 3 (Week 3): Pt will bathe face with Mod A using compensatory techniques/strategies as needed. OT Short Term Goal 4 (Week 3): Pt will maintain sitting balance EOB with max A in preparation for TB transfers  Skilled Therapeutic Interventions/Progress Updates:    Pt greeted semi-reclined in bed and agreeable to OT treatment session. Pt declined to get up to power chair at this time but agreeable to bed level bathing/dressing. OT provided gentle stretching and ROM to all 4 extremeties prior to BADLs. Pt noted to be incontinent of large BM. +2 needed for positioning/rolling in bed while OT provided total A for peri-care and brief change. LB bathing/dressing completed with total A and education provided to pt on directing self-care. UB bathing/dressing from bed level with total A but some movement in R UE. Toothbrushing completed sitting upright in bed with OT asisst to hold toothbrush while pT activated R UE to push and pulll toothbrush. OT Then assisted for thoroughness. Pt left semi-reclined in bed at end of session with sip and puff call light in reach and needs met.   Therapy Documentation Precautions:  Precautions Precautions: Cervical, Fall Precaution/Restrictions Comments: MAP >65 Splint/Cast - Date Prophylactic Dressing Applied (if applicable):  (on) Restrictions Weight Bearing Restrictions Per Provider Order: No LUE Weight Bearing Per Provider Order: Touch down weight bearing Other Position/Activity  Restrictions: Forearm based thumb spica LUE (per ortho); WHO RUE, B PRAFOs General:   Vital Signs:   Pain: Pain Assessment Pain Scale: 0-10 Pain Score: 7  Pain Location: Generalized Pain Intervention(s): Medication (See eMAR) ADL: ADL Eating: Dependent Where Assessed-Eating: Bed level Grooming: Dependent Where Assessed-Grooming: Bed level Upper Body Bathing: Dependent Where Assessed-Upper Body Bathing: Bed level Lower Body Bathing: Dependent Where Assessed-Lower Body Bathing: Bed level Upper Body Dressing: Dependent Where Assessed-Upper Body Dressing: Bed level Lower Body Dressing: Dependent Where Assessed-Lower Body Dressing: Bed level Toileting: Dependent Where Assessed-Toileting: Bed level Toilet Transfer: Not assessed Toilet Transfer Method: Not assessed Tub/Shower Transfer: Not assessed Walk-In Shower Transfer: Not assessed Vision   Perception    Praxis   Balance   Exercises:   Other Treatments:     Therapy/Group: Individual Therapy  Sharyle GORMAN Pert 05/04/2024, 1:52 PM

## 2024-05-04 NOTE — Progress Notes (Signed)
 IP Rehab Bowel Program Documentation   Bowel Program Start time [phone]  Dig Stim Indicated? Yes  Dig Stim Prior to Suppository or mini Enema X 5   Output from dig stim: Small  Ordered intervention: Suppository Yes , mini enema No ,   Repeat dig stim after Suppository or Mini enema  X 5,  Output? Minimal   Bowel Program Complete? No , handoff given [employer]  Patient Tolerated? Yes

## 2024-05-04 NOTE — Progress Notes (Signed)
 PROGRESS NOTE   Subjective/Complaints:  Pt doing well, slept well, pain doing fine and not really having much spasm issues today. LBM last night. Urinating fine with condom cath/purewick. No other complaints or concerns.   ROS: as per HPI. Denies CP, SOB, abd pain, N/V/D/C, or any other complaints at this time.      Objective:   No results found.   Recent Labs    05/02/24 0523  WBC 5.6  HGB 10.5*  HCT 32.2*  PLT 314     Recent Labs    05/02/24 0523  NA 139  K 4.4  CL 98  CO2 27  GLUCOSE 93  BUN 15  CREATININE 0.59*  CALCIUM  9.3          Intake/Output Summary (Last 24 hours) at 05/04/2024 1132 Last data filed at 05/04/2024 0933 Gross per 24 hour  Intake 438 ml  Output 2651 ml  Net -2213 ml         Physical Exam: Vital Signs Blood pressure 106/66, pulse 71, temperature (!) 97.5 F (36.4 C), temperature source Oral, resp. rate 16, height 6' 4 (1.93 m), weight 88.9 kg, SpO2 99%.    Constitutional: No distress . Vital signs reviewed. Up in bed.  HEENT: NCAT, EOMI, oral membranes moist Neck: supple Cardiovascular: RRR without murmur. No JVD    Respiratory/Chest: CTA Bilaterally without wheezes or rales. Normal effort    GI/Abdomen: BS +, non-tender, non-distended, soft Ext: no clubbing, cyanosis, or edema Psych: pleasant and cooperative  Neuro: A&O x4, memory intact, insight and awareness intact.   PRIOR EXAMS: MSK: generalized tenderness to palpation in shoulders/upper arms with bed mobility and AROM/PROM Skin: minimal drainage from lower aspect of incision, sacral wound dressed.  Neurological:     Mental Status: He is alert.     Comments: Alert and oriented x 3. Normal insight and awareness. Intact Memory. Normal language and speech. Cranial nerve exam unremarkable. MMT: RUE: delt 2+/5, biceps 3- to 3/5, triceps maybe trace, wrist and HI all 0/5.  LUE: delt 1/5, biceps 2/5, triceps, wrist,  HI all 0/5. RLE: 1/5 HE and ~1+/5 KE, 0/5 everywhere else. LLE: 2 to 2+/5 HF, HAD and KE, ADF/PF 2+/5. Sensory exam diminished below C5 on right and ~C6 on left. 1/2 sensation in trunk and both LE, as well as inguinal/rectal area. Sensed sl more in LLE. DTR's 1+ in BUE and 3+ in BLE. Pt without resting BLE tone.  Perhaps a beat or two of clonus in either foot.    Assessment/Plan: 1. Functional deficits which require 3+ hours per day of interdisciplinary therapy in a comprehensive inpatient rehab setting. Physiatrist is providing close team supervision and 24 hour management of active medical problems listed below. Physiatrist and rehab team continue to assess barriers to discharge/monitor patient progress toward functional and medical goals  Care Tool:  Bathing        Body parts bathed by helper: Buttocks, Right upper leg, Left upper leg, Right lower leg, Left lower leg, Face, Right arm, Chest, Left arm, Abdomen, Front perineal area     Bathing assist Assist Level: Dependent - Patient 0%     Upper Body Dressing/Undressing Upper  body dressing   What is the patient wearing?: Pull over shirt    Upper body assist Assist Level: Dependent - Patient 0%    Lower Body Dressing/Undressing Lower body dressing      What is the patient wearing?: Incontinence brief     Lower body assist Assist for lower body dressing: Dependent - Patient 0%     Toileting Toileting    Toileting assist Assist for toileting: Dependent - Patient 0%     Transfers Chair/bed transfer  Transfers assist     Chair/bed transfer assist level: Dependent - mechanical lift     Locomotion Ambulation   Ambulation assist   Ambulation activity did not occur: Safety/medical concerns          Walk 10 feet activity   Assist  Walk 10 feet activity did not occur: Safety/medical concerns        Walk 50 feet activity   Assist Walk 50 feet with 2 turns activity did not occur: Safety/medical  concerns         Walk 150 feet activity   Assist Walk 150 feet activity did not occur: Safety/medical concerns         Walk 10 feet on uneven surface  activity   Assist Walk 10 feet on uneven surfaces activity did not occur: Safety/medical concerns         Wheelchair     Assist Is the patient using a wheelchair?: Yes Type of Wheelchair: Power    Wheelchair assist level: Contact Guard/Touching assist, Minimal Assistance - Patient > 75% Max wheelchair distance: 200+    Wheelchair 50 feet with 2 turns activity    Assist        Assist Level: Minimal Assistance - Patient > 75%   Wheelchair 150 feet activity     Assist      Assist Level: Minimal Assistance - Patient > 75%   Blood pressure 106/66, pulse 71, temperature (!) 97.5 F (36.4 C), temperature source Oral, resp. rate 16, height 6' 4 (1.93 m), weight 88.9 kg, SpO2 99%.  Medical Problem List and Plan: 1. 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 Darnella of [facility] on 03/28/24:             -patient may not yet shower             -ELOS/Goals: 05/14/24.  mod to max assist goals             -No collar needed per Dr. Darnella             -Bilateral PRAFO's, R WHO.  -Will switch PRAFOs to Prevalons due to spasticity D/c date 9/30, power w/c education -Continue CIR therapies including PT, OT, family ed 2.  Antithrombotics: -DVT/anticoagulation:  Pharmaceutical: Lovenox  30mg  BID 9/1- is (-) for DVT 9/4- needs Elqiuis or Lovenox  for a total of 3 months             -antiplatelet therapy: ASA 81mg  daily 3. Pain Management: Cymbalta  30mg  nightly, Gabapentin  600mg  TID, Tylenol  1000mg  q6h, Robaxin  1000mg  q8h, oxycodone  10-15mg  q4h prn,   -Consider increasing Cymbalta  to 60mg  nightly for improved pain control if needed -Pt developing early hypertonicity in LE's. Will begin scheduling tizanidine  at 2mg  tid to start. Can have 2mg   tid prn as well -04/13/24 decrease tylenol  650mg  QID d/t transminitis as below 9/1- changed Cymbalta  to qday since had nightmares last night- didn't take trazodone . 9/3- still having nightmares, but not med related- might be PTSD? Vs subconscious dealing with his injury? 9/15- pain is improved. Pt satsified 9/18- will increase Cymbalta  to 60 mg daily- to help mood which I'm concerned about as well as pain 9/19 patient reported upper body soreness today. Told me he was satisfied with pain mgt currently 4. Mood/Behavior/Sleep: ego support             -antipsychotic agents: n/a             -Psych consult inpatient, no needs at that time -Pt will need emotional support. Seems to be pretty upbeat. Family sounds supportive. Will have Dr. Corina see pt at some point during admit  9/4- D/w pt and family today that he's very 'bright- but I'm expecting him to hit a wall when all this really hits.  9/19 cymbalta  increased to 60mg  as above 5. Neuropsych/cognition: This patient is capable of making decisions on his own behalf. 6. Skin/Wound Care/large forehead laceration: staples removed 04/04/24.  Routine skin care.  9/8- incision on posterior neck puffy, draining, called Dr Darnella via NP 7. Fluids/Electrolytes/Nutrition: routine I&O, routine labs, continue vitamins/supplements (Ensure plus high protein BID, MVI)             -Regular diet 8. 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 Lester of [facility], continue ASA 81mg  daily 9. Superior endplate compression fx T5, T7, T8, T9, B/L upper thoracic posterior paraspinal muscle injury and edema, interspinous ligament injury T2-3 and possibly T3-4: no intervention needed. Per Dr. Darnella.  10. B/L apical pulmonary contusions, small pulmonary lac RLL: aggressive  pulmonary toilet 11. L Ulnar styloid fx: NWB LUE, splint, f/up Dr. Murrell 9/4- when gets closer to 6 weeks, will call Dr Marjorie to go over when can WBAT 9/9- cannot see a surgery done? So 6 weeks from 8/14- called Dr Marjorie- ordered a wrist xray to f/u- and unlikely to be able to [facility] prior to d/c  -05/04/24 it's been just over 6wks now, consider talking to Dr. Murrell this week 12. Orthostatic hypotension: continue Midodrine  20mg  TID, Strattera  25mg  daily also started per Dr. Jyl recs with goal perhaps of coming off midodrine . -continue TEDs and abdominal binder -04/14/24 BPs improving, monitor 9/1-9/2 in general BP's soft, in 100s-110s. Supine, but doing better- con't regimen 9/3-9/4 BP's running low 90's to 110's- no concern about OH currently per therapy -9/6-7/25 BP occasionally elevated briefly but improves, no AD sxs, monitor 9/8- BP 131 systolic this AM! Sitting upright 9/15--bp remains very soft. Continue acclimation with PT 9/16- 9/17- BP still very soft, but doing better in last 24 hours- low 100's systolic- -con't regimen 9/18-19- BP's soft but stable  -continue midodrine  and strattera  -05/04/24 BPs improving, monitor Vitals:   04/30/24 0800 04/30/24 1325 04/30/24 2006 05/01/24 0404  BP: 99/70 114/78 134/86 112/80   05/01/24 1100 05/01/24 1525 05/01/24 1955 05/02/24 0416  BP: 133/87 118/75 107/74 106/77   05/02/24 1341 05/03/24 0413 05/03/24 1943 05/04/24 0441  BP: (!) 149/100 (!) 95/57 128/83 106/66    13. Neurogenic bowel: on  bowel program: Dulcolax suppository 10mg  daily, Miralax  BID, SenokotS 1 tab BID             -will adjust bowel program to 1800 daily             -  senokot-s 2 tabs q am and miralax  BID  9/5- no BM last night and none with another bowel program this AM in spite of being done-- will give Sorbitol  60cc now and then SSE this evening with bowel program- ordered -04/21/24 small BM last night-- monitor 9/9- Will give 60cc Sorbitol  at 2pm and SE at 6pm at part of  bowel program- cannot add Linzess since isn't insured- might need at home to take Mg citrate every few days- also taking a lot of Oxycodone  as well - hopefully this will decrease over time.  9/11 having response to bowel program but stool too soft  -hold miralax  bid and 3 senna-s's daily for now  -resume them in some form tomorrow 9/12 formed stool last night, resume daily miralax  tomorrow and 2 senna-s tabs today -9/15 had a large T6 bm at 0100 this am 9/16- Had large BM overnight with bowel program 9/17- had large BM at 11:45 pm- 9/18-19 no bm or bowel program documented---I am follow up with nursing. Needs to be on program -05/04/24 BM overnight, getting bowel program. Monitor.  14. Neurogenic bladder:              -adjust I/O cath schedule to volumes of 300-500 cc -04/13/24 apparently used condom cath last night-- does feel urge to urinate, no bladder scans documented but pt says they were done but no ISC needed -Reviewed with nursing the plan for him, no condom cath for now, attempt with urinal q4-6h and bladder scan -ISC if scan >250cc 9/4- still not voiding with toileting 9/5- I still think he's kicking off, but not emptying- he has no feeling of voiding, just leaking-- same 9/6-7, no caths still, but incontinent 9/12-15 pt has been incontinent with low to medium PVR's. He insists that he can feel when he needs to empty and that staff can't get to him in time. He has mentioned not wanting to bother nursing so much.  -continue timed voids q3-4 hours and obsv  -encouraged pt to notify staff when he needs to empty -continue voiding trial/education 9/16- Pt says staff cannot get to him, but mentions the time is very short- like 1-2 minutes at times- of note- con't regimen 9/17-9/19 still kicking off, regularly incontinent. Consider antispasmodic and I/O caths vs condom cath/bag.  15. Polysubstance use disorder: UDS +cocaine, THC, Benzos-- provide counseling 16. Calcifications of aortic valve  seen on CT C/A/P: echo 65-70% on 03/29/24, mild MV regurg, calcifications R coronary cusp less on noncoronary cusp, no regurg, AV sclerosis/calcifications present without stenosis. F/up outpatient.  17. ABLA: hgb stable 8-9 range, monitor twice weekly  -04/13/24 Hgb 9.6, monitor>> stable 9/4 at 10 18. Transaminitis: -04/13/24 Alk phos 286, AST 58, ALT 153-- could be from shock liver, monitor again on Monday. Decrease tylenol  to 650mg  QID. No abd pain/n/v but consider imaging/work up if those develop.  9/1-  Down to 42 and 119 from 50 and 153- will reduction in Tylenol - -if not better Thursday, will reduce Tylenol  further 9/4- will stop scheduled tylenol  and make 325 mg q6 hours prn if need be 9/8- AST/ALT about the same- 48/110- and alk phos 213 (alk phos is down substantially)- if not down by Thursday, will reassess 9/9- cannot start Dantrolene with these issues 9/16- AST 42 up from 29 and ALT 95 up from 74- will recheck Thursday- because not taking tylenol - could be baclofen , but I don't know what else can can do for spasticity? 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 - pt had BP yesterday of 166/92- pt admits had HA- so had AD- educated pt and family on AD- will continue to educate staff, because we were not called on the probable AD episode yesterday- 166/92 was BP- isolated spike in BP = AD esp in pt that has OH- went over with PA and staff about how to treat- sit pt up, loosen tight clothing, then if doesn't improve, cath him; do bowel program- that's 80% of all AD causes- if not improved, let PA/Doctor know 9/5- Had BP of 177/92 this AM- likely AD, although no HA- nurse did great catch and tried to do bowel program- no results- will order another round of Sorbitol  60cc with SSE this evening to get him cleaned out- esp with constipation -04/20/24 occasionally elevated BP but improves quickly, no AD symptoms,  monitor 9/9- had AD yesterday AM- no HA or nasal congestions but BP -9/12-15 no further AD. Having daily results with bowel program.    20. Spasticity-  9/5- will increase Baclofen  to 10 mg TID_ and first dose at 630am- d/w nursing and therapy that spasticity is progressing- once it starts, it's pretty fast initially- usually starts around 3-4 weeks and progresses, as I educated family about- and unfortunately we will try to keep up, but it's hard- he also has Zanaflex  2 mg TID AND 2 mg TID prn for additional spasms 9/8- has improved spasticity-  9/9- spasms much worse this AM- like didn't get meds, but per chart, received Baclofen  at 546 this AM- will increase Zanaflex  to 4 mg TID- it's progressing so fast- not sure due to inflammation from mild incision infection- on keflex - can set it off 9/12-15 continue above schedule. Spasticity is MUCH improved compared to admit 9/16-9/19- spasticity much improved 21. 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 If so, will order SLP eval.  22. Wound abscess- in superficial tissues, not actual deep wound infection per Dr Darnella 9/9-11- [facility] saw and put on keflex  for 7 days- will con't to monitor- is still draining- - per pt, Dr Darnella mentioned that if things weren't better at day 7 (9/15 at latest) that to call him back.  9/12-15 wound/drainage appear to be improving--continue with above plan      LOS: 22 days A FACE TO FACE EVALUATION WAS PERFORMED  [address] 05/04/2024, 11:32 AM

## 2024-05-05 NOTE — Progress Notes (Signed)
 Moderate incontinent type 6 BM in brief before performing dig. Stim. Dig stim x 1 for moderate type 6 BM. Bowel program complete. Patient tolerated without complaint of.Tyrone Mckinney

## 2024-05-05 NOTE — Plan of Care (Signed)
  Problem: Consults Goal: RH SPINAL CORD INJURY PATIENT EDUCATION Description:  See Patient Education module for education specifics.  Outcome: Progressing   Problem: SCI BOWEL ELIMINATION Goal: RH STG MANAGE BOWEL WITH ASSISTANCE Description: STG Manage Bowel with max Assistance. Outcome: Progressing   Problem: Consults Goal: RH SPINAL CORD INJURY PATIENT EDUCATION Description:  See Patient Education module for education specifics.  Outcome: Progressing   Problem: SCI BOWEL ELIMINATION Goal: RH STG MANAGE BOWEL WITH ASSISTANCE Description: STG Manage Bowel with max Assistance. Outcome: Progressing   Problem: SCI BLADDER ELIMINATION Goal: RH STG MANAGE BLADDER WITH ASSISTANCE Description: STG Manage Bladder With max Assistance Outcome: Progressing   Problem: RH SKIN INTEGRITY Goal: RH STG SKIN FREE OF INFECTION/BREAKDOWN Description: Manage skin free of infection with max assistance Outcome: Progressing   Problem: RH SAFETY Goal: RH STG ADHERE TO SAFETY PRECAUTIONS W/ASSISTANCE/DEVICE Description: STG Adhere to Safety Precautions With max  Assistance/Device. Outcome: Progressing   Problem: RH PAIN MANAGEMENT Goal: RH STG PAIN MANAGED AT OR BELOW PT'S PAIN GOAL Description: <4 w/ prns Outcome: Progressing   Problem: RH KNOWLEDGE DEFICIT SCI Goal: RH STG INCREASE KNOWLEDGE OF SELF CARE AFTER SCI Description: Manage increase knowledge of self care after SCI with max assistance from spouse/ family using educational materials  provided.  Outcome: Progressing

## 2024-05-05 NOTE — Progress Notes (Signed)
 IP Rehab Bowel Program Documentation   Bowel Program Start time 1800  Dig Stim Indicated? Yes  Dig Stim Prior to Suppository or mini Enema X 5   Output from dig stim: Small  Ordered intervention: Suppository Yes , mini enema No ,   Repeat dig stim after Suppository or Mini enema  X 5,  Output? Small   Bowel Program Complete? Yes , handoff given Nightshift  Patient Tolerated? Yes

## 2024-05-05 NOTE — Progress Notes (Signed)
 PROGRESS NOTE   Subjective/Complaints:  Pt doing well again today, slept well, pain doing fine. LBM last night. Urinating fine with condom cath/purewick. No other complaints or concerns.   ROS: as per HPI. Denies CP, SOB, abd pain, N/V/D/C, or any other complaints at this time.      Objective:   No results found.   No results for input(s): WBC, HGB, HCT, PLT in the last 72 hours.    No results for input(s): NA, K, CL, CO2, GLUCOSE, BUN, CREATININE, CALCIUM  in the last 72 hours.         Intake/Output Summary (Last 24 hours) at 05/05/2024 1206 Last data filed at 05/05/2024 0855 Gross per 24 hour  Intake 1115 ml  Output 2920 ml  Net -1805 ml         Physical Exam: Vital Signs Blood pressure 95/61, pulse (!) 59, temperature 97.7 F (36.5 C), temperature source Oral, resp. rate 18, height 6' 4 (1.93 m), weight 88.9 kg, SpO2 97%.    Constitutional: No distress . Vital signs reviewed. Resting comfortably in bed.  HEENT: NCAT, EOMI, oral membranes moist Neck: supple Cardiovascular: RRR without murmur. No JVD    Respiratory/Chest: CTA Bilaterally without wheezes or rales. Normal effort    GI/Abdomen: BS +, non-tender, non-distended, soft Ext: no clubbing, cyanosis, or edema Psych: pleasant and cooperative  Neuro: A&O x4, memory intact, insight and awareness intact.   PRIOR EXAMS: MSK: generalized tenderness to palpation in shoulders/upper arms with bed mobility and AROM/PROM Skin: minimal drainage from lower aspect of incision, sacral wound dressed.  Neurological:     Mental Status: He is alert.     Comments: Alert and oriented x 3. Normal insight and awareness. Intact Memory. Normal language and speech. Cranial nerve exam unremarkable. MMT: RUE: delt 2+/5, biceps 3- to 3/5, triceps maybe trace, wrist and HI all 0/5.  LUE: delt 1/5, biceps 2/5, triceps, wrist, HI all 0/5. RLE: 1/5 HE  and ~1+/5 KE, 0/5 everywhere else. LLE: 2 to 2+/5 HF, HAD and KE, ADF/PF 2+/5. Sensory exam diminished below C5 on right and ~C6 on left. 1/2 sensation in trunk and both LE, as well as inguinal/rectal area. Sensed sl more in LLE. DTR's 1+ in BUE and 3+ in BLE. Pt without resting BLE tone.  Perhaps a beat or two of clonus in either foot.    Assessment/Plan: 1. Functional deficits which require 3+ hours per day of interdisciplinary therapy in a comprehensive inpatient rehab setting. Physiatrist is providing close team supervision and 24 hour management of active medical problems listed below. Physiatrist and rehab team continue to assess barriers to discharge/monitor patient progress toward functional and medical goals  Care Tool:  Bathing        Body parts bathed by helper: Buttocks, Right upper leg, Left upper leg, Right lower leg, Left lower leg, Face, Right arm, Chest, Left arm, Abdomen, Front perineal area     Bathing assist Assist Level: Dependent - Patient 0%     Upper Body Dressing/Undressing Upper body dressing   What is the patient wearing?: Pull over shirt    Upper body assist Assist Level: Dependent - Patient 0%  Lower Body Dressing/Undressing Lower body dressing      What is the patient wearing?: Incontinence brief     Lower body assist Assist for lower body dressing: Dependent - Patient 0%     Toileting Toileting    Toileting assist Assist for toileting: Dependent - Patient 0%     Transfers Chair/bed transfer  Transfers assist     Chair/bed transfer assist level: Dependent - mechanical lift     Locomotion Ambulation   Ambulation assist   Ambulation activity did not occur: Safety/medical concerns          Walk 10 feet activity   Assist  Walk 10 feet activity did not occur: Safety/medical concerns        Walk 50 feet activity   Assist Walk 50 feet with 2 turns activity did not occur: Safety/medical concerns         Walk 150  feet activity   Assist Walk 150 feet activity did not occur: Safety/medical concerns         Walk 10 feet on uneven surface  activity   Assist Walk 10 feet on uneven surfaces activity did not occur: Safety/medical concerns         Wheelchair     Assist Is the patient using a wheelchair?: Yes Type of Wheelchair: Power    Wheelchair assist level: Contact Guard/Touching assist, Minimal Assistance - Patient > 75% Max wheelchair distance: 200+    Wheelchair 50 feet with 2 turns activity    Assist        Assist Level: Minimal Assistance - Patient > 75%   Wheelchair 150 feet activity     Assist      Assist Level: Minimal Assistance - Patient > 75%   Blood pressure 95/61, pulse (!) 59, temperature 97.7 F (36.5 C), temperature source Oral, resp. rate 18, height 6' 4 (1.93 m), weight 88.9 kg, SpO2 97%.  Medical Problem List and Plan: 1. 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 Darnella of [facility] on 03/28/24:             -patient may not yet shower             -ELOS/Goals: 05/14/24.  mod to max assist goals             -No collar needed per Dr. Darnella             -Bilateral PRAFO's, R WHO.  -Will switch PRAFOs to Prevalons due to spasticity D/c date 9/30, power w/c education -Continue CIR therapies including PT, OT, family ed 2.  Antithrombotics: -DVT/anticoagulation:  Pharmaceutical: Lovenox  30mg  BID 9/1- is (-) for DVT 9/4- needs Elqiuis or Lovenox  for a total of 3 months             -antiplatelet therapy: ASA 81mg  daily 3. Pain Management: Cymbalta  30mg  nightly, Gabapentin  600mg  TID, Tylenol  1000mg  q6h, Robaxin  1000mg  q8h, oxycodone  10-15mg  q4h prn,  -Consider increasing Cymbalta  to 60mg   nightly for improved pain control if needed -Pt developing early hypertonicity in LE's. Will begin scheduling tizanidine  at 2mg  tid to start. Can have 2mg  tid prn as well -04/13/24 decrease tylenol  650mg  QID d/t transminitis as below 9/1- changed Cymbalta  to qday since had nightmares last night- didn't take trazodone . 9/3-  still having nightmares, but not med related- might be PTSD? Vs subconscious dealing with his injury? 9/15- pain is improved. Pt satsified 9/18- will increase Cymbalta  to 60 mg daily- to help mood which I'm concerned about as well as pain 9/19 patient reported upper body soreness today. Told me he was satisfied with pain mgt currently 4. Mood/Behavior/Sleep: ego support             -antipsychotic agents: n/a             -Psych consult inpatient, no needs at that time -Pt will need emotional support. Seems to be pretty upbeat. Family sounds supportive. Will have Dr. Corina see pt at some point during admit  9/4- D/w pt and family today that he's very 'bright- but I'm expecting him to hit a wall when all this really hits.  9/19 cymbalta  increased to 60mg  as above 5. Neuropsych/cognition: This patient is capable of making decisions on his own behalf. 6. Skin/Wound Care/large forehead laceration: staples removed 04/04/24.  Routine skin care.  9/8- incision on posterior neck puffy, draining, called Dr Darnella via NP 7. Fluids/Electrolytes/Nutrition: routine I&O, routine labs, continue vitamins/supplements (Ensure plus high protein BID, MVI)             -Regular diet 8. 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 Lester of [facility], continue ASA 81mg  daily 9. Superior endplate compression fx T5, T7, T8, T9, B/L upper thoracic posterior paraspinal muscle injury and edema, interspinous ligament injury T2-3 and possibly T3-4: no intervention needed. Per Dr. Darnella.  10. B/L apical pulmonary contusions, small pulmonary lac RLL: aggressive pulmonary toilet 11. L Ulnar styloid fx:  NWB LUE, splint, f/up Dr. Murrell 9/4- when gets closer to 6 weeks, will call Dr Marjorie to go over when can WBAT 9/9- cannot see a surgery done? So 6 weeks from 8/14- called Dr Marjorie- ordered a wrist xray to f/u- and unlikely to be able to [facility] prior to d/c  -05/04/24 it's been just over 6wks now, consider talking to Dr. Murrell this week 12. Orthostatic hypotension: continue Midodrine  20mg  TID, Strattera  25mg  daily also started per Dr. Jyl recs with goal perhaps of coming off midodrine . -continue TEDs and abdominal binder -04/14/24 BPs improving, monitor 9/1-9/2 in general BP's soft, in 100s-110s. Supine, but doing better- con't regimen 9/3-9/4 BP's running low 90's to 110's- no concern about OH currently per therapy -9/6-7/25 BP occasionally elevated briefly but improves, no AD sxs, monitor 9/8- BP 131 systolic this AM! Sitting upright 9/15--bp remains very soft. Continue acclimation with PT 9/16- 9/17- BP still very soft, but doing better in last 24 hours- low 100's systolic- -con't regimen 9/18-19- BP's soft but stable  -continue midodrine  and strattera  -9/20-21/25 BPs improving, usually has spike in afternoon, monitor Vitals:   05/01/24 0404 05/01/24 1100 05/01/24 1525 05/01/24 1955  BP: 112/80 133/87 118/75 107/74   05/02/24 0416 05/02/24 1341 05/03/24 0413 05/03/24 1943  BP: 106/77 (!) 149/100 (!) 95/57 128/83   05/04/24 0441 05/04/24 1513 05/04/24 1920 05/05/24 0403  BP: 106/66 (!) 147/90 111/78 95/61    13. Neurogenic bowel: on  bowel program: Dulcolax suppository 10mg  daily, Miralax  BID, SenokotS 1 tab BID             -will adjust bowel program to 1800 daily             -senokot-s 2 tabs q am and miralax  BID  9/5- no BM last night and none with another bowel program  this AM in spite of being done-- will give Sorbitol  60cc now and then SSE this evening with bowel program- ordered -04/21/24 small BM last night-- monitor 9/9- Will give 60cc Sorbitol  at 2pm and SE at 6pm at part of  bowel program- cannot add Linzess since isn't insured- might need at home to take Mg citrate every few days- also taking a lot of Oxycodone  as well - hopefully this will decrease over time.  9/11 having response to bowel program but stool too soft  -hold miralax  bid and 3 senna-s's daily for now  -resume them in some form tomorrow 9/12 formed stool last night, resume daily miralax  tomorrow and 2 senna-s tabs today -9/15 had a large T6 bm at 0100 this am 9/16- Had large BM overnight with bowel program 9/17- had large BM at 11:45 pm- 9/18-19 no bm or bowel program documented---I am follow up with nursing. Needs to be on program -9/20-21/25 BM overnight, getting bowel program. Monitor.  14. Neurogenic bladder:              -adjust I/O cath schedule to volumes of 300-500 cc -04/13/24 apparently used condom cath last night-- does feel urge to urinate, no bladder scans documented but pt says they were done but no ISC needed -Reviewed with nursing the plan for him, no condom cath for now, attempt with urinal q4-6h and bladder scan -ISC if scan >250cc 9/4- still not voiding with toileting 9/5- I still think he's kicking off, but not emptying- he has no feeling of voiding, just leaking-- same 9/6-7, no caths still, but incontinent 9/12-15 pt has been incontinent with low to medium PVR's. He insists that he can feel when he needs to empty and that staff can't get to him in time. He has mentioned not wanting to bother nursing so much.  -continue timed voids q3-4 hours and obsv  -encouraged pt to notify staff when he needs to empty -continue voiding trial/education 9/16- Pt says staff cannot get to him, but mentions the time is very short- like 1-2 minutes at times- of note- con't regimen 9/17-9/19 still kicking off, regularly incontinent. Consider antispasmodic and I/O caths vs condom cath/bag.  15. Polysubstance use disorder: UDS +cocaine, THC, Benzos-- provide counseling 16. Calcifications of aortic  valve seen on CT C/A/P: echo 65-70% on 03/29/24, mild MV regurg, calcifications R coronary cusp less on noncoronary cusp, no regurg, AV sclerosis/calcifications present without stenosis. F/up outpatient.  17. ABLA: hgb stable 8-9 range, monitor twice weekly  -04/13/24 Hgb 9.6, monitor>> stable 9/4 at 10 18. Transaminitis: -04/13/24 Alk phos 286, AST 58, ALT 153-- could be from shock liver, monitor again on Monday. Decrease tylenol  to 650mg  QID. No abd pain/n/v but consider imaging/work up if those develop.  9/1-  Down to 92 and 119 from 53 and 153- will reduction in Tylenol - -if not better Thursday, will reduce Tylenol  further 9/4- will stop scheduled tylenol  and make 325 mg q6 hours prn if need be 9/8- AST/ALT about the same- 48/110- and alk phos 213 (alk phos is down substantially)- if not down by Thursday, will reassess 9/9- cannot start Dantrolene with these issues 9/16- AST 42 up from 29 and ALT 95 up from 74- will recheck Thursday- because not taking tylenol - could be baclofen , but I don't know what else can can do for spasticity? 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 - pt had  BP yesterday of 166/92- pt admits had HA- so had AD- educated pt and family on AD- will continue to educate staff, because we were not called on the probable AD episode yesterday- 166/92 was BP- isolated spike in BP = AD esp in pt that has OH- went over with PA and staff about how to treat- sit pt up, loosen tight clothing, then if doesn't improve, cath him; do bowel program- that's 80% of all AD causes- if not improved, let PA/Doctor know 9/5- Had BP of 177/92 this AM- likely AD, although no HA- nurse did great catch and tried to do bowel program- no results- will order another round of Sorbitol  60cc with SSE this evening to get him cleaned out- esp with constipation -04/20/24 occasionally elevated BP but improves quickly, no AD  symptoms, monitor 9/9- had AD yesterday AM- no HA or nasal congestions but BP -9/12-15 no further AD. Having daily results with bowel program.    20. Spasticity-  9/5- will increase Baclofen  to 10 mg TID_ and first dose at 630am- d/w nursing and therapy that spasticity is progressing- once it starts, it's pretty fast initially- usually starts around 3-4 weeks and progresses, as I educated family about- and unfortunately we will try to keep up, but it's hard- he also has Zanaflex  2 mg TID AND 2 mg TID prn for additional spasms 9/8- has improved spasticity-  9/9- spasms much worse this AM- like didn't get meds, but per chart, received Baclofen  at 546 this AM- will increase Zanaflex  to 4 mg TID- it's progressing so fast- not sure due to inflammation from mild incision infection- on keflex - can set it off 9/12-15 continue above schedule. Spasticity is MUCH improved compared to admit 9/16-9/19- spasticity much improved 21. 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 If so, will order SLP eval.  22. Wound abscess- in superficial tissues, not actual deep wound infection per Dr Darnella 9/9-11- NSU saw and put on keflex  for 7 days- will con't to monitor- is still draining- - per pt, Dr Darnella mentioned that if things weren't better at day 7 (9/15 at latest) that to call him back.  9/12-15 wound/drainage appear to be improving--continue with above plan      LOS: 23 days A FACE TO FACE EVALUATION WAS PERFORMED  [address] 05/05/2024, 12:06 PM

## 2024-05-06 LAB — CBC
HCT: 34.8 % — ABNORMAL LOW (ref 39.0–52.0)
Hemoglobin: 11.5 g/dL — ABNORMAL LOW (ref 13.0–17.0)
MCH: 29.7 pg (ref 26.0–34.0)
MCHC: 33 g/dL (ref 30.0–36.0)
MCV: 89.9 fL (ref 80.0–100.0)
Platelets: 284 K/uL (ref 150–400)
RBC: 3.87 MIL/uL — ABNORMAL LOW (ref 4.22–5.81)
RDW: 13 % (ref 11.5–15.5)
WBC: 4.9 K/uL (ref 4.0–10.5)
nRBC: 0 % (ref 0.0–0.2)

## 2024-05-06 NOTE — Progress Notes (Signed)
 IP Rehab Bowel Program Documentation   Bowel Program Start time [phone]   Dig Stim Indicated? Yes  Dig Stim Prior to Suppository or mini Enema X 2   Output from dig stim: Minimal  Ordered intervention: Suppository Yes , mini enema No ,   Repeat dig stim after Suppository or Mini enema  X 2,  Output? Minimal   Bowel Program Complete? Yes , handoff given patient stated BM comes in middle of night or early morning hours, which is normal for him  Patient Tolerated? Yes

## 2024-05-06 NOTE — Progress Notes (Signed)
 Physical Therapy Session Note  Patient Details  Name: Tyrone Mckinney MRN: [id number] Date of Birth: 17-May-1981  Today's Date: 05/06/2024 PT Individual Time: 1015-1130, 1300-1345 PT Individual Time Calculation (min): 75 min, 45 min   Short Term Goals: Week 3:  PT Short Term Goal 1 (Week 3): Pt will recall and teach back pressure relief strategies PT Short Term Goal 2 (Week 3): Pt will initiate PWC navigation with assist PT Short Term Goal 3 (Week 3): Pt will direct transfers without cue  Skilled Therapeutic Interventions/Progress Updates:    Session 1: pt received in bed and agreeable to therapy. No complaint of pain. Session focused on setting up PWC with switch to increase independence with PWC controls. Pt can no turn chair on/off without assist, however needs assist to access position change menu. Reviewed pressure relief timing and frequency, with pt reporting no recall of this information. This therapist believes it is not the first time this education has been given, therefore pt will benefit from reinforcement. Pt able to teach back pressure relief and performed during session with cueing. Pt participated in slideboard transfer with +2 tot a for trunk management and +2 for safety. Pt able to increasingly use LE to decr burden of care during transfer. Therapist provided stretch for BUE/shoulder in posteriorly propped position with weight bearing on RUE. During this time, discussed prognosis, tenodesis grasp, and progression of tone over time. Pt expressed understanding. Pt returned to chair and remained up in PWC, instructed to ask for assist if needed for pressure relief if family not present.   Session 2: Pt received in PWC, agreeable to therapy. No complaint of pain. Pt reports incontinence of urine, requesting assist with change. slideboard transfer with max a, +2 assisting with board stabilization and min a lifting. Tot a for trunk management but continued ability to use BLE to  assist with decr caregiver burden. Tot +2 for sit>supine. Pt able to roll with mod-max with BLE management for dependent brief change for fully saturated brief. Noted small spot consistent with pressure injury on R ischial tuberosity. Nsg made aware.  Pt then participated in pushing against foot board to slide up bed x 4 with dependent assist to slide back down. Tapping facilitation to R quad to improve activation. Note improving LLE activation, pt would benefit from further activities in weight bearing. Pt handed off to nsg care to don purewick as pt states he will not get back up today.   Therapy Documentation Precautions:  Precautions Precautions: Cervical, Fall Precaution/Restrictions Comments: MAP >65 Splint/Cast - Date Prophylactic Dressing Applied (if applicable):  (on) Restrictions Weight Bearing Restrictions Per Provider Order: No LUE Weight Bearing Per Provider Order: Touch down weight bearing Other Position/Activity Restrictions: Forearm based thumb spica LUE (per ortho); WHO RUE, B PRAFOs General:       Therapy/Group: Individual Therapy  [name] 05/06/2024, 12:48 PM

## 2024-05-06 NOTE — Progress Notes (Signed)
 Occupational Therapy Session Note  Patient Details  Name: Tyrone Mckinney MRN: [id number] Date of Birth: 30-Aug-1980  Today's Date: 05/06/2024 OT Individual Time: 0700-0810 OT Individual Time Calculation (min): 70 min    Short Term Goals: Week 3:  OT Short Term Goal 1 (Week 3): Pt will roll laterally in bed with Max A x1 to assist with ADL routine. OT Short Term Goal 2 (Week 3): Pt will self-feed with Mod A using compensatory techniques/strategies as needed. OT Short Term Goal 3 (Week 3): Pt will bathe face with Mod A using compensatory techniques/strategies as needed. OT Short Term Goal 4 (Week 3): Pt will maintain sitting balance EOB with max A in preparation for TB transfers  Skilled Therapeutic Interventions/Progress Updates:    Skilled OT intervention with focus on bed mobility, sitting balance, TB transfers, discharge planning, directing self care, and safety awareness to increase independence with BADLs reduce burden on caregivers. All bed mobility-rolling, supine>sit, sitting balance-tot A+1. OTA assisted with donning Ted hose, pants, and shoes with tot A. Pt incontinent of bladder-pt stated he needed to urinate but couldn't control it adequately. Tot A for toileting tasks at bed level. Supine>sit EOB with tot A. TB transfer with tot A+2. Pt able to control PWC. Tot A for repositioning. Pt remained in PWC. Pt able to activate sip n puff.   Therapy Documentation Precautions:  Precautions Precautions: Cervical, Fall Precaution/Restrictions Comments: MAP >65 Splint/Cast - Date Prophylactic Dressing Applied (if applicable):  (on) Restrictions Weight Bearing Restrictions Per Provider Order: No LUE Weight Bearing Per Provider Order: Touch down weight bearing Other Position/Activity Restrictions: Forearm based thumb spica LUE (per ortho); WHO RUE, B PRAFOs Pain: Pt reports his shoulder is doing ok but butt hurts; MD assessed sacral wound and pt repositioned   Therapy/Group:  Individual Therapy  [name] 05/06/2024, 8:15 AM

## 2024-05-06 NOTE — Consult Note (Addendum)
 WOC Nurse Consult Note: patient underwent emergent C2-C7 PCDF for spinal cord injury 03/28/2024; last neurosurgeon note 04/22/2024 when contacted about concerns for surgical wound infection;see that note felt to be stitch abscess and started on Keflex   Reason for Consult: drainage spinal surgical incision  Wound type: full thickness status post surgery as above  Pressure Injury POA: NA  Measurement: see nursing flowsheet  Wound bed: photo from 9/12 reviewed, appears pink moist; requested updated photo be placed in chart  Drainage (amount, consistency, odor) purulent per bedside nurse  Periwound:  Dressing procedure/placement/frequency: Cleanse spinal surgical incision with Vashe wound cleanser Soila [phone]) do not rinse and allow to air dry. Apply silver hydrofiber ([employer] Lawson [phone]) to wound bed daily and secure with silicone foam or ABD pad and clothe tape whichever is preferred to manage exudate.   Would recommend neurosurgeon be reconsulted for concerns regarding this incision    POC discussed with bedside nurse as well as importance of notifying surgeon regarding concerns.    WOC team will not follow. Re-consult if further needs arise.   Thank you,    Powell Bar MSN, RN-BC, [employer]

## 2024-05-07 ENCOUNTER — Inpatient Hospital Stay (HOSPITAL_COMMUNITY): Payer: Self-pay

## 2024-05-07 MED ORDER — DICLOFENAC SODIUM 1 % EX GEL
4.0000 g | Freq: Four times a day (QID) | CUTANEOUS | Status: DC
  Administered 2024-05-07 – 2024-05-14 (×16): 4 g via TOPICAL
  Filled 2024-05-07 (×2): qty 100

## 2024-05-07 MED ORDER — PROCHLORPERAZINE MALEATE 5 MG PO TABS
5.0000 mg | ORAL_TABLET | Freq: Four times a day (QID) | ORAL | Status: DC | PRN
  Administered 2024-05-07: 5 mg via ORAL
  Filled 2024-05-07: qty 1

## 2024-05-07 NOTE — Plan of Care (Signed)
  Problem: RH Grooming Goal: LTG Patient will perform grooming w/assist,cues/equip (OT) Description: LTG: Patient will perform grooming with assist, with/without cues using equipment (OT) Outcome: Not Applicable Flowsheets (Taken 05/07/2024 1121) LTG: Pt will perform grooming with assistance level of: (d/c goal) -- Note: D/c goal    Problem: RH Bathing Goal: LTG Patient will bathe all body parts with assist levels (OT) Description: LTG: Patient will bathe all body parts with assist levels (OT) Outcome: Not Applicable Flowsheets (Taken 05/07/2024 1121) LTG: Pt will perform bathing with assistance level/cueing: (Downgraded JLS) -- Note: Downgraded JLS   Problem: RH Dressing Goal: LTG Patient will perform lower body dressing w/assist (OT) Description: LTG: Patient will perform lower body dressing with assist, with/without cues in positioning using equipment (OT) Outcome: Not Applicable Flowsheets (Taken 05/07/2024 1121) LTG: Pt will perform lower body dressing with assistance level of: (d/c goal) -- Note: D/c goal    Problem: RH Toileting Goal: LTG Patient will perform toileting task (3/3 steps) with assistance level (OT) Description: LTG: Patient will perform toileting task (3/3 steps) with assistance level (OT)  Outcome: Not Applicable Flowsheets (Taken 05/07/2024 1121) LTG: Pt will perform toileting task (3/3 steps) with assistance level: (d/c goal) -- Note: D/c goal    Problem: RH Tub/Shower Transfers Goal: LTG Patient will perform tub/shower transfers w/assist (OT) Description: LTG: Patient will perform tub/shower transfers with assist, with/without cues using equipment (OT) Outcome: Not Applicable Flowsheets (Taken 05/07/2024 1121) LTG: Pt will perform tub/shower stall transfers with assistance level of: (d/c goal) -- LTG: Pt will perform tub/shower transfers from: (d/c goal) -- Note: D/c goal    Problem: RH Eating Goal: LTG Patient will perform eating w/assist, cues/equip  (OT) Description: LTG: Patient will perform eating with assist, with/without cues using equipment (OT) Flowsheets (Taken 05/07/2024 1121) LTG: Pt will perform eating with assistance level of: (Downgraded JLS) Maximal Assistance - Patient 25 - 49% Note: Downgraded JLS   Problem: RH Dressing Goal: LTG Patient will perform upper body dressing (OT) Description: LTG Patient will perform upper body dressing with assist, with/without cues (OT). Flowsheets (Taken 05/07/2024 1121) LTG: Pt will perform upper body dressing with assistance level of: (Downgraded JLS) Moderate Assistance - Patient 50 - 74% Note: Downgraded JLS   Problem: RH Functional Use of Upper Extremity Goal: LTG Patient will use RT/LT upper extremity as a (OT) Description: LTG: Patient will use right/left upper extremity as a stabilizer/gross assist/diminished/nondominant/dominant level with assist, with/without cues during functional activity (OT) Flowsheets (Taken 05/07/2024 1121) LTG: Use of upper extremity in functional activities:  RUE as a stabilizer  LUE as a stabilizer LTG: Pt will use upper extremity in functional activity with assistance level of: Moderate Assistance - Patient 50 - 74%

## 2024-05-07 NOTE — Progress Notes (Signed)
 PROGRESS NOTE   Subjective/Complaints:  Pt reports L knee hurts with lateral movement- mainly with hip abduction/rolling of L hip- none with ROM of actual knee.  Says taking Oxy ~ q6 hour.  Says L knee pai is mild  ROS: as per HPI.   Pt denies SOB, abd pain, CP, N/V/C/D, and vision changes    Objective:   No results found.   Recent Labs    05/06/24 0534  WBC 4.9  HGB 11.5*  HCT 34.8*  PLT 284      No results for input(s): NA, K, CL, CO2, GLUCOSE, BUN, CREATININE, CALCIUM  in the last 72 hours.         Intake/Output Summary (Last 24 hours) at 05/07/2024 0944 Last data filed at 05/07/2024 0559 Gross per 24 hour  Intake 600 ml  Output 500 ml  Net 100 ml         Physical Exam: Vital Signs Blood pressure 100/69, pulse 65, temperature 98.7 F (37.1 C), resp. rate 18, height 6' 4 (1.93 m), weight 88.9 kg, SpO2 98%.     General: awake, alert, appropriate, sitting up in bed- being turned by therapy/OT; NAD HENT: conjugate gaze; oropharynx moist CV: regular rate and rhythm; no JVD Pulmonary: CTA B/L; no W/R/R- good air movement GI: soft, NT, ND, (+)BS Psychiatric: appropriate- bright affect Neurological: Ox3 MSK- mild swelling of L knee- no pain or TTP over actual knee or IT band- but elicited pain with abduction and rotation of L hip. Ext: no clubbing, cyanosis, or edema except mild swelling of L knee- maybe mild effusion Psych: pleasant and cooperative  Neuro: A&O x4, memory intact, insight and awareness intact.   PRIOR EXAMS: MSK: generalized tenderness to palpation in shoulders/upper arms with bed mobility and AROM/PROM Skin: minimal drainage from lower aspect of incision, sacral wound dressed.  Neurological:     Mental Status: He is alert.     Comments: Alert and oriented x 3. Normal insight and awareness. Intact Memory. Normal language and speech. Cranial nerve exam  unremarkable. MMT: RUE: delt 2+/5, biceps 3- to 3/5, triceps maybe trace, wrist and HI all 0/5.  LUE: delt 1/5, biceps 2/5, triceps, wrist, HI all 0/5. RLE: 1/5 HE and ~1+/5 KE, 0/5 everywhere else. LLE: 2 to 2+/5 HF, HAD and KE, ADF/PF 2+/5. Sensory exam diminished below C5 on right and ~C6 on left. 1/2 sensation in trunk and both LE, as well as inguinal/rectal area. Sensed sl more in LLE. DTR's 1+ in BUE and 3+ in BLE. Pt without resting BLE tone.  Perhaps a beat or two of clonus in either foot.    Assessment/Plan: 1. Functional deficits which require 3+ hours per day of interdisciplinary therapy in a comprehensive inpatient rehab setting. Physiatrist is providing close team supervision and 24 hour management of active medical problems listed below. Physiatrist and rehab team continue to assess barriers to discharge/monitor patient progress toward functional and medical goals  Care Tool:  Bathing        Body parts bathed by helper: Buttocks, Right upper leg, Left upper leg, Right lower leg, Left lower leg, Face, Right arm, Chest, Left arm, Abdomen, Front perineal area  Bathing assist Assist Level: Dependent - Patient 0%     Upper Body Dressing/Undressing Upper body dressing   What is the patient wearing?: Pull over shirt    Upper body assist Assist Level: Dependent - Patient 0%    Lower Body Dressing/Undressing Lower body dressing      What is the patient wearing?: Incontinence brief, Pants     Lower body assist Assist for lower body dressing: Dependent - Patient 0%     Toileting Toileting    Toileting assist Assist for toileting: Dependent - Patient 0%     Transfers Chair/bed transfer  Transfers assist     Chair/bed transfer assist level: Dependent - mechanical lift     Locomotion Ambulation   Ambulation assist   Ambulation activity did not occur: Safety/medical concerns          Walk 10 feet activity   Assist  Walk 10 feet activity did not  occur: Safety/medical concerns        Walk 50 feet activity   Assist Walk 50 feet with 2 turns activity did not occur: Safety/medical concerns         Walk 150 feet activity   Assist Walk 150 feet activity did not occur: Safety/medical concerns         Walk 10 feet on uneven surface  activity   Assist Walk 10 feet on uneven surfaces activity did not occur: Safety/medical concerns         Wheelchair     Assist Is the patient using a wheelchair?: Yes Type of Wheelchair: Power    Wheelchair assist level: Contact Guard/Touching assist, Minimal Assistance - Patient > 75% Max wheelchair distance: 200+    Wheelchair 50 feet with 2 turns activity    Assist        Assist Level: Minimal Assistance - Patient > 75%   Wheelchair 150 feet activity     Assist      Assist Level: Minimal Assistance - Patient > 75%   Blood pressure 100/69, pulse 65, temperature 98.7 F (37.1 C), resp. rate 18, height 6' 4 (1.93 m), weight 88.9 kg, SpO2 98%.  Medical Problem List and Plan: 1. 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 Darnella of [facility] on 03/28/24:             -patient may not yet shower             -ELOS/Goals: 05/14/24.  mod to max assist goals             -No collar needed per Dr. Darnella             -Bilateral PRAFO's, R WHO.  -Will switch PRAFOs to Prevalons due to spasticity D/c date 9/30, power w/c education Con't CIR PT and OT Team conference to f/u on progress D/w OT- about L knee pain- as below #23 2.  Antithrombotics: -DVT/anticoagulation:  Pharmaceutical: Lovenox  30mg  BID 9/1- is (-) for DVT 9/4- needs Elqiuis or Lovenox  for a total of 3 months             -antiplatelet therapy:  ASA 81mg  daily 3. Pain Management: Cymbalta  30mg  nightly, Gabapentin  600mg  TID, Tylenol  1000mg  q6h, Robaxin  1000mg  q8h, oxycodone  10-15mg  q4h prn,  -Consider increasing Cymbalta  to 60mg   nightly for improved pain control if needed -Pt developing early hypertonicity in LE's. Will begin scheduling tizanidine  at 2mg  tid to start. Can have 2mg  tid prn as well -04/13/24 decrease tylenol  650mg  QID d/t transminitis as below 9/1- changed Cymbalta  to qday since had nightmares last night- didn't take trazodone . 9/3- still having nightmares, but not med related- might be PTSD? Vs subconscious dealing with his injury? 9/15- pain is improved. Pt satsified 9/18- will increase Cymbalta  to 60 mg daily- to help mood which I'm concerned about as well as pain 9/19 patient reported upper body soreness today. Told me he was satisfied with pain mgt currently 9/23- added voltaren  gel for L knee 4. Mood/Behavior/Sleep: ego support             -antipsychotic agents: n/a             -Psych consult inpatient, no needs at that time -Pt will need emotional support. Seems to be pretty upbeat. Family sounds supportive. Will have Dr. Corina see pt at some point during admit  9/4- D/w pt and family today that he's very 'bright- but I'm expecting him to hit a wall when all this really hits.  9/19 cymbalta  increased to 60mg  as above 5. Neuropsych/cognition: This patient is capable of making decisions on his own behalf. 6. Skin/Wound Care/large forehead laceration: staples removed 04/04/24.  Routine skin care.  9/8- incision on posterior neck puffy, draining, called Dr Darnella via NP 7. Fluids/Electrolytes/Nutrition: routine I&O, routine labs, continue vitamins/supplements (Ensure plus high protein BID, MVI)             -Regular diet 8. 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 Lester of [facility], continue ASA 81mg  daily 9. Superior endplate compression fx T5, T7, T8, T9, B/L upper thoracic posterior paraspinal muscle  injury and edema, interspinous ligament injury T2-3 and possibly T3-4: no intervention needed. Per Dr. Darnella.  10. B/L apical pulmonary contusions, small pulmonary lac RLL: aggressive pulmonary toilet 11. L Ulnar styloid fx: NWB LUE, splint, f/up Dr. Murrell 9/4- when gets closer to 6 weeks, will call Dr Marjorie to go over when can WBAT 9/9- cannot see a surgery done? So 6 weeks from 8/14- called Dr Marjorie- ordered a wrist xray to f/u- and unlikely to be able to [facility] prior to d/c  -05/04/24 it's been just over 6wks now, consider talking to Dr. Murrell this week 9/23- will call today-L/M for Dr Murrell to call me back and see what the next step is- since it's been 6 weeks 12. Orthostatic hypotension: continue Midodrine  20mg  TID, Strattera  25mg  daily also started per Dr. Jyl recs with goal perhaps of coming off midodrine . -continue TEDs and abdominal binder -04/14/24 BPs improving, monitor 9/1-9/2 in general BP's soft, in 100s-110s. Supine, but doing better- con't regimen 9/3-9/4 BP's running low 90's to 110's- no concern about OH currently per therapy -9/6-7/25 BP occasionally elevated briefly but improves, no AD sxs, monitor 9/8- BP 131 systolic this AM! Sitting upright 9/15--bp remains very soft. Continue acclimation with PT 9/16- 9/17- BP still very soft, but doing better in last 24 hours- low 100's systolic- -con't regimen 9/18-19- BP's soft but stable  -continue midodrine  and strattera  -9/20-21/25 BPs improving, usually has spike in afternoon, monitor Vitals:   05/03/24 0413 05/03/24 1943 05/04/24 0441 05/04/24 1513  BP: (!) 95/57 128/83 106/66 (!) 147/90   05/04/24 1920 05/05/24 0403 05/05/24 1610 05/05/24 2200  BP: 111/78 95/61 122/84 112/78   05/06/24 0438 05/06/24 1407  05/06/24 1934 05/07/24 0403  BP: 102/65 (!) 156/98 104/72 100/69    13. Neurogenic bowel: on  bowel program: Dulcolax suppository 10mg  daily, Miralax  BID, SenokotS 1 tab BID             -will adjust bowel program to 1800  daily             -senokot-s 2 tabs q am and miralax  BID  9/5- no BM last night and none with another bowel program this AM in spite of being done-- will give Sorbitol  60cc now and then SSE this evening with bowel program- ordered -04/21/24 small BM last night-- monitor 9/9- Will give 60cc Sorbitol  at 2pm and SE at 6pm at part of bowel program- cannot add Linzess since isn't insured- might need at home to take Mg citrate every few days- also taking a lot of Oxycodone  as well - hopefully this will decrease over time.  9/11 having response to bowel program but stool too soft  -hold miralax  bid and 3 senna-s's daily for now  -resume them in some form tomorrow 9/12 formed stool last night, resume daily miralax  tomorrow and 2 senna-s tabs today -9/15 had a large T6 bm at 0100 this am 9/16- Had large BM overnight with bowel program 9/17- had large BM at 11:45 pm- 9/18-19 no bm or bowel program documented---I am follow up with nursing. Needs to be on program -9/20-21/25 BM overnight, getting bowel program. Monitor. 9/23- Small BM and minimal results from day bowel program - if no good BM by tomorrow, will do sorbitol   14. Neurogenic bladder:              -adjust I/O cath schedule to volumes of 300-500 cc -04/13/24 apparently used condom cath last night-- does feel urge to urinate, no bladder scans documented but pt says they were done but no ISC needed -Reviewed with nursing the plan for him, no condom cath for now, attempt with urinal q4-6h and bladder scan -ISC if scan >250cc 9/4- still not voiding with toileting 9/5- I still think he's kicking off, but not emptying- he has no feeling of voiding, just leaking-- same 9/6-7, no caths still, but incontinent 9/12-15 pt has been incontinent with low to medium PVR's. He insists that he can feel when he needs to empty and that staff can't get to him in time. He has mentioned not wanting to bother nursing so much.  -continue timed voids q3-4 hours and obsv   -encouraged pt to notify staff when he needs to empty -continue voiding trial/education 9/16- Pt says staff cannot get to him, but mentions the time is very short- like 1-2 minutes at times- of note- con't regimen 9/17-9/19 still kicking off, regularly incontinent. Consider antispasmodic and I/O caths vs condom cath/bag.  15. Polysubstance use disorder: UDS +cocaine, THC, Benzos-- provide counseling 16. 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 F/up outpatient.  17. ABLA: hgb stable 8-9 range, monitor twice weekly  -04/13/24 Hgb 9.6, monitor>> stable 9/4 at 10 18. Transaminitis: -04/13/24 Alk phos 286, AST 58, ALT 153-- could be from shock liver, monitor again on Monday. Decrease tylenol  to 650mg  QID. No abd pain/n/v but consider imaging/work up if those develop.  9/1-  Down to 42 and 119 from 71 and 153- will reduction in Tylenol - -if not better Thursday, will reduce Tylenol  further 9/4- will stop  scheduled tylenol  and make 325 mg q6 hours prn if need be 9/8- AST/ALT about the same- 48/110- and alk phos 213 (alk phos is down substantially)- if not down by Thursday, will reassess 9/9- cannot start Dantrolene with these issues 9/16- AST 42 up from 29 and ALT 95 up from 74- will recheck Thursday- because not taking tylenol - could be baclofen , but I don't know what else can can do for spasticity? 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 - pt had BP yesterday of 166/92- pt admits had HA- so had AD- educated pt and family on AD- will continue to educate staff, because we were not called on the probable AD episode yesterday- 166/92 was BP- isolated spike in BP = AD esp in pt that has OH- went over with PA and staff about  how to treat- sit pt up, loosen tight clothing, then if doesn't improve, cath him; do bowel program- that's 80% of all AD causes- if not improved, let PA/Doctor know 9/5- Had BP of 177/92 this AM- likely AD, although no HA- nurse did great catch and tried to do bowel program- no results- will order another round of Sorbitol  60cc with SSE this evening to get him cleaned out- esp with constipation -04/20/24 occasionally elevated BP but improves quickly, no AD symptoms, monitor 9/9- had AD yesterday AM- no HA or nasal congestions but BP -9/12-15 no further AD. Having daily results with bowel program.    20. Spasticity-  9/5- will increase Baclofen  to 10 mg TID_ and first dose at 630am- d/w nursing and therapy that spasticity is progressing- once it starts, it's pretty fast initially- usually starts around 3-4 weeks and progresses, as I educated family about- and unfortunately we will try to keep up, but it's hard- he also has Zanaflex  2 mg TID AND 2 mg TID prn for additional spasms 9/8- has improved spasticity-  9/9- spasms much worse this AM- like didn't get meds, but per chart, received Baclofen  at 546 this AM- will increase Zanaflex  to 4 mg TID- it's progressing so fast- not sure due to inflammation from mild incision infection- on keflex - can set it off 9/12-15 continue above schedule. Spasticity is MUCH improved compared to admit 9/16-9/19- spasticity much improved 9/23- spasticity controlled per pt- con't regimen 21. 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 If so, will order SLP eval.  22. Wound abscess- in superficial tissues, not actual deep wound infection per Dr Darnella 9/9-11- NS[facility] saw and put on keflex  for 7 days- will con't to monitor- is still draining- - per pt, Dr  Darnella mentioned that if things weren't better at day 7 (9/15 at latest) that to call him back.  9/12-15 wound/drainage appear to be improving--continue with above plan 9/23- assessed wound yesterday- looks open, however didn't appear to have drainage- will reassess.  23. L knee pain  9/23- will add Voltaren  gel 4x/day for L knee    I spent a total of 51   minutes on total care today- >50% coordination of care- due to  Called Dr Murrell about pt's L wrist- team conference to f/u on progress; exam  and d/w pt about L knee pain- and d/w OT    LOS: 25 days A FACE TO FACE EVALUATION WAS PERFORMED  Loyola Santino 05/07/2024, 9:44 AM
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
## 2024-05-07 NOTE — Progress Notes (Signed)
 Occupational Therapy Weekly Progress Note  Patient Details  Name: Tyrone Mckinney MRN: [id number] Date of Birth: Jul 04, 1981  Beginning of progress report period: April 26, 2024 End of progress report period: May 07, 2024  Patient has met 2 of 4 short term goals.  Pt made steady gains with bed mobility and sitting balance this past week. Rolling in bed with max A+1 using bed rails to maintain sidelying. Supine>sit with tot A. Static sitting balance EOB with min to max A in preparation for TB transfers to [facility]. Pt with adequate shoulder activation to maneuver PWC; switches have been added for turning it on/off. Pt independent with directing care. Pt requires tot A for bathing/dressing tasks at bed level. Family has participated in education sessions and has demonstrated use of manual hoyer lift and TB transfers.   Patient continues to demonstrate the following deficits: muscle weakness and muscle paralysis, decreased cardiorespiratoy endurance, impaired timing and sequencing and unbalanced muscle activation, and decreased sitting balance, decreased postural control, and decreased balance strategies and therefore will continue to benefit from skilled OT intervention to enhance overall performance with BADL and Reduce care partner burden.  Patient not progressing toward long term goals; goals adjusted to projected outcome as we have progressed.  See goal revision..  Continue plan of care.  OT Short Term Goals Week 3:  OT Short Term Goal 1 (Week 3): Pt will roll laterally in bed with Max A x1 to assist with ADL routine. OT Short Term Goal 1 - Progress (Week 3): Met OT Short Term Goal 2 (Week 3): Pt will self-feed with Mod A using compensatory techniques/strategies as needed. OT Short Term Goal 2 - Progress (Week 3): Progressing toward goal OT Short Term Goal 3 (Week 3): Pt will bathe face with Mod A using compensatory techniques/strategies as needed. OT Short Term Goal 3 - Progress (Week  3): Progressing toward goal OT Short Term Goal 4 (Week 3): Pt will maintain sitting balance EOB with max A in preparation for TB transfers OT Short Term Goal 4 - Progress (Week 3): Met Week 4:  OT Short Term Goal 1 (Week 4): STG=LTG 2/2 ELOS   [name] 05/07/2024, 6:35 AM

## 2024-05-07 NOTE — Consult Note (Addendum)
 WOC Nurse wound follow up Wound type: stage 3 pressure injury to sacrum- healed          Dressing procedure/placement/frequency:  Foam dressing to the sacrum per the nursing skin care protocol    WOC team will continue to follow for L heel DTPI.  Thank you,  Doyal Polite, MSN, RN, [facility] WOC Team [phone] (Available Mon-Fri 0700-1500)

## 2024-05-07 NOTE — Progress Notes (Signed)
 Occupational Therapy Session Note  Patient Details  Name: Tyrone Mckinney MRN: [id number] Date of Birth: 1980/10/22  Today's Date: 05/07/2024 OT Individual Time: 0700-0800 OT Individual Time Calculation (min): 60 min    Short Term Goals: Week 4:  OT Short Term Goal 1 (Week 4): STG=LTG 2/2 ELOS  Skilled Therapeutic Interventions/Progress Updates:    Pt resting in bed upon arrival. Skilled OT intervention with focus on bed mobility, sitting balance, and directing care. Rolling in bed with max A using bed rails for maintaining sidelying. Pt dependent for LB dressing and donning Ted hose.Supine>sit EOB with tot A. TB transfer with tot A +2 for safety. Pt dependent for repositioning in PWC. Pt remained in PWC with sip n puff in position. Pt able to activate sip n puff.  Therapy Documentation Precautions:  Precautions Precautions: Cervical, Fall Precaution/Restrictions Comments: MAP >65 Splint/Cast - Date Prophylactic Dressing Applied (if applicable):  (on) Restrictions Weight Bearing Restrictions Per Provider Order: No LUE Weight Bearing Per Provider Order: Touch down weight bearing Other Position/Activity Restrictions: Forearm based thumb spica LUE (per ortho); WHO RUE, B PRAFOs   Pain: Pt denies pain this morning   Therapy/Group: Individual Therapy  [name] 05/07/2024, 8:07 AM

## 2024-05-07 NOTE — Progress Notes (Signed)
 IP Rehab Bowel Program Documentation   Bowel Program Start time 1821   Dig Stim Indicated? Yes  Dig Stim Prior to Suppository or mini Enema X 2   Output from dig stim: Minimal  Ordered intervention: Suppository Yes , mini enema No ,   Repeat dig stim after Suppository or Mini enema  X 2,  Output? Pt stated that BM normally happens in middle of night, early morning. No output during dig stim  Bowel Program Complete? Yes , handoff given   Patient Tolerated? Yes

## 2024-05-07 NOTE — Progress Notes (Signed)
 Physical Therapy Weekly Progress Note  Patient Details  Name: Tyrone Mckinney MRN: [id number] Date of Birth: 1981/04/10  Beginning of progress report period: April 30, 2024 End of progress report period: May 07, 2024  Today's Date: 05/07/2024 PT Individual Time:[phone], 1300-1400 PT Individual Time Calculation (min): 45 min, 60 min   Patient has met 1 of 3 short term goals.  Pt requires maxA +2 for bed mobility and transfers via slide board. Pt able to direct transfer w/ minimal cueing. Family education has been initiated on [employer], slide board transfers, community mobility, home setup, and functional mobility and prognosis w/ pt wife and daughter. All questions have been answered up to this point.   Patient continues to demonstrate the following deficits muscle weakness and muscle paralysis, decreased cardiorespiratoy endurance, impaired timing and sequencing, abnormal tone, unbalanced muscle activation, motor apraxia, decreased coordination, and decreased motor planning, and decreased sitting balance, decreased postural control, and decreased balance strategies and therefore will continue to benefit from skilled PT intervention to increase functional independence with mobility.  Patient not progressing toward long term goals.  See goal revision..  Plan of care revisions: Goals downgraded to better reflect prognosis of pt.  PT Short Term Goals Week 3:  PT Short Term Goal 1 (Week 3): Pt will recall and teach back pressure relief strategies PT Short Term Goal 1 - Progress (Week 3): Progressing toward goal PT Short Term Goal 2 (Week 3): Pt will initiate PWC navigation with assist PT Short Term Goal 2 - Progress (Week 3): Met PT Short Term Goal 3 (Week 3): Pt will direct transfers without cue PT Short Term Goal 3 - Progress (Week 3): Progressing toward goal Week 4:  PT Short Term Goal 1 (Week 4): STG = LTG due to ELOS  Skilled Therapeutic Interventions/Progress Updates:    Session 1:   Pt received upright in PWC receiving care from wife. Pt agreeable to therapy. Pt w/ no c/o pain at this time, just soreness in neck.  Pt wife Lyle present for family ed, discussion on home setup, including hospital bed location/placement, pressure relief, PWC functions, and slide board demonstration. Bathroom setup discussed at length, w/ options such as transport chair considered by pt wife. Pt w/ questions on transportation home from hospital, to/from doctors appts. Discussion on transportation held at length as pt was educated on potential solutions and options available for uninsured vs insured.  Pt navigated to day room w/ supervision assist, able to position himself correctly for transfer w/ SBA for safety. Pt performed slide board transfer to<>from PWC to mat table w/ max-totalA +2 for lifting and lateral propulsion, trunk management. Pt navigated back to room w/ supervision assist, given call bell and all other needs in reach.   Session 2:  Pt received supine in bed, agreeable to therapy but reports symptoms of nausea. Session performed at bed-level due to pt condition. Monitored vital signs, found to be trending down, reported to nsg.    Pt educated on wc evaluation process, measurements taken for letter of medical necessity for power wheelchair. Pt educated on muscle tone, activation, and prognosis for different muscles and their functionality moving forward, including quadriceps, hamstrings, adductors, and glutes. Pt demonstrates no activation in hamstrings, decreased activation in adductors and glutes. Pt expressed understanding on purpose and outcome of testing as well as prognosis moving forward.   At end of session, pt remained supine in bed w/ sip and puff in reach. Pressure relief performed in bed as pt accommodated  to R sided lean w/ wedges.  Therapy Documentation Precautions:  Precautions Precautions: Cervical, Fall Precaution/Restrictions Comments: MAP  >65 Splint/Cast - Date Prophylactic Dressing Applied (if applicable):  (on) Restrictions Weight Bearing Restrictions Per Provider Order: No LUE Weight Bearing Per Provider Order: Touch down weight bearing Other Position/Activity Restrictions: Forearm based thumb spica LUE (per ortho); WHO RUE, B PRAFOs    Therapy/Group: Individual Therapy  Oneil Grumbles 05/07/2024, 4:35 PM

## 2024-05-07 NOTE — Progress Notes (Signed)
 Pt showing signs of autonomic dysreflexia. Pt feeling queezy and sleepy. NP at bedside to assess. Nurse put patient in bed, pt was incontinent, personal care given to patient, shoes and TED hose removed, wedges placed on both sides of patient, VSS, MD notified of occurrence. Pt currently resting in bed, call light within reach

## 2024-05-07 NOTE — Progress Notes (Signed)
 Occupational Therapy Session Note  Patient Details  Name: Rithik Serque Pickerill MRN: [id number] Date of Birth: Apr 23, 1981  Today's Date: 05/07/2024 OT Individual Time: 1130-1145 OT Individual Time Calculation (min): 15 min  and Today's Date: 05/07/2024 OT Missed Time: 15 Minutes Missed Time Reason: Other (comment) (n/v)   Short Term Goals: Week 4:  OT Short Term Goal 1 (Week 4): STG=LTG 2/2 ELOS  Skilled Therapeutic Interventions/Progress Updates:    Pt resting in PWC upon arrival. Pt c/o n/v and reported that nursing was getting some meds for him. BP assessed at 103/73 HR 62. Pt denies any pain. OTA donned Lt shoe on pt that WOC had removed. Pillows placed under BUE. BUE PROM/AAROM seated in w/c for joint mobility and strengthening. Pt remained in PWC with wife present.   Therapy Documentation Precautions:  Precautions Precautions: Cervical, Fall Precaution/Restrictions Comments: MAP >65 Splint/Cast - Date Prophylactic Dressing Applied (if applicable):  (on) Restrictions Weight Bearing Restrictions Per Provider Order: No LUE Weight Bearing Per Provider Order: Touch down weight bearing Other Position/Activity Restrictions: Forearm based thumb spica LUE (per ortho); WHO RUE, B PRAFOs General: General OT Amount of Missed Time: 15 Minutes Pain:  Pt denied pain but c/o n/v; RN aware   Therapy/Group: Individual Therapy  [name] 05/07/2024, 11:53 AM

## 2024-05-07 NOTE — Consult Note (Addendum)
 WOC Nurse wound follow up Wound type: DTI, left heel. Measurement: 2 cm x 2 cm, darker purple skin at the Achilles tendon with 3 cm x 1.5 cm device related. Wound bed: no open wound. Drainage (amount, consistency, odor) None Periwound: intact. Dressing procedure/placement/frequency: Clean with warm water, pat dry. Apply Foam dressing to the L heel. Change every 3 days or PRN.  WOC team will follow weekly. Please reconsult if further assistance is needed. Thank-you,  Lela Holm RN, CNS, [employer], MSN.  (Phone [phone])

## 2024-05-08 NOTE — Progress Notes (Addendum)
 IP Rehab Bowel Program Documentation   Bowel Program Start time 1800  Dig Stim Indicated? No  Dig Stim Prior to Suppository or mini Enema X 1   Output from dig stim: None  Ordered intervention: Suppository Yes , mini enema No ,   Repeat dig stim after Suppository or Mini enema  X 1,  Output? None. Patient had a BM last night and in the morning. Patient will have BMat night.   Bowel Program Complete? Yes , handoff given Night nurse  Patient Tolerated? Yes

## 2024-05-08 NOTE — Progress Notes (Signed)
 Physical Therapy Session Note  Patient Details  Name: Tyrone Mckinney MRN: [id number] Date of Birth: June 10, 1981  Today's Date: 05/08/2024 PT Individual Time: 1435-1534 PT Individual Time Calculation (min): 59 min   Short Term Goals: Week 4:  PT Short Term Goal 1 (Week 4): STG = LTG due to ELOS  Skilled Therapeutic Interventions/Progress Updates:    Pt presents in room in bed, agreeable to PT. Pt denies pain. Session focused on therapeutic activities with emphasis on pt directing care for bed mobility and transfers as well as standing tolerance and BLE/core/UE muscle fiber recruitment. Pt noted to be incontinent, pt provided with skilled cues for rolling in bed, max assist for rolling bilaterally, total assist for periarea hygiene and brief management as well as donning pants and shoes. Pt completes supine to sit with max assist x2, bringing BLEs off bed then max assist for trunk to upright. Pt completes slideboard transfer with max assist x2, pt able to direct care for placement of slideboard as well as body positioning for ease of transfer. Pt self propels PWC from room to main gym with supervision and positioned in standing frame. Pt completes standing in standing frame for 9 minutes with pt demonstrating forward trunk fold for rest while standing, prompted to come to upright posture to complete 4x10 glute sets with pt demonstrating increased L quad activation during glute sets. Pt then returns to sitting, pt demonstrating L knee locked in hyperextension and when cued to bend pt demonstrating quick descent however denies pain, pt lower the rest of the way to sitting and positioned backwards in chair with max assist x2. Pt returned to room supervision propelling PWC, able to direct care for max assist x2 slideboard transfer back to bed, max assist x2 for sit to supine. Pt able to reposition towards [facility] with assist x2 with therapist placing BLEs in hooklying, bed in trendelenberg, pt able to push to  desired position. Pt remains semi reclined with prevalon boots donned, all needs within reach, sip and puff call light in place at end of session.  Therapy Documentation Precautions:  Precautions Precautions: Cervical, Fall Precaution/Restrictions Comments: MAP >65 Splint/Cast - Date Prophylactic Dressing Applied (if applicable):  (on) Restrictions Weight Bearing Restrictions Per Provider Order: Yes LUE Weight Bearing Per Provider Order: Touch down weight bearing Other Position/Activity Restrictions: Forearm based thumb spica LUE (per ortho); WHO RUE, B PRAFOs   Therapy/Group: Individual Therapy  Reche Ohara PT, DPT 05/08/2024, 5:12 PM

## 2024-05-08 NOTE — Progress Notes (Signed)
 Physical Therapy Session Note  Patient Details  Name: Tyrone Mckinney MRN: [id number] Date of Birth: [birth year]  Today's Date: 05/08/2024 PT Individual Time: 0800-0900 PT Individual Time Calculation (min): 60 min   Short Term Goals: Week 4:  PT Short Term Goal 1 (Week 4): STG = LTG due to ELOS  Skilled Therapeutic Interventions/Progress Updates:   Pt received supine in bed, agreeable to therapy. Pt required brief change total A +2 for pericare and rolling R/L for donning new brief and sacral pad. Pt required maxA +2 to don pants in supine. Pt performed bed mobility w/ max A for trunk elevation and BLE management, maxA for slide board transfer w/ +2 for safety, stabilizing slide board.   Pt navigated to day room w/ supervision, able to position himself appropriately for slide board transfer and direct transfer verbally. Performed w/ same assist as above.   Seated edge of mat w/ +2 behind pt on yoga ball, pt participated in seated balance activities to improve functional activity tolerance and decrease caregiver burden. Pt demonstrated good lateral stability, resisting perturbations in all 4 directions, ant/posterior w/ decreased strength/coordination. Pt performed several sets of abdominal crunches from reclined yoga ball, up to seated balance, required momentum and multiple attempts for success, given CGA for safety. Pt fatigued quickly, requiring rest breaks between sets.  Pt performed slide board transfer w/ same assist as above, navigated to room w/ supervision and given sip and puff call bell and all other needs in reach.   Therapy Documentation Precautions:  Precautions Precautions: Cervical, Fall Precaution/Restrictions Comments: MAP >65 Splint/Cast - Date Prophylactic Dressing Applied (if applicable):  (on) Restrictions Weight Bearing Restrictions Per Provider Order: Yes LUE Weight Bearing Per Provider Order: Touch down weight bearing Other Position/Activity Restrictions: Forearm  based thumb spica LUE (per ortho); WHO RUE, B PRAFOs   Therapy/Group: Individual Therapy  Oneil Grumbles 05/08/2024, 12:37 PM

## 2024-05-08 NOTE — Plan of Care (Signed)
  Problem: Consults Goal: RH SPINAL CORD INJURY PATIENT EDUCATION Description:  See Patient Education module for education specifics.  Outcome: Progressing   Problem: SCI BOWEL ELIMINATION Goal: RH STG MANAGE BOWEL WITH ASSISTANCE Description: STG Manage Bowel with max Assistance. Outcome: Progressing   Problem: SCI BLADDER ELIMINATION Goal: RH STG MANAGE BLADDER WITH ASSISTANCE Description: STG Manage Bladder With max Assistance Outcome: Progressing   Problem: RH SKIN INTEGRITY Goal: RH STG SKIN FREE OF INFECTION/BREAKDOWN Description: Manage skin free of infection with max assistance Outcome: Progressing   Problem: RH SAFETY Goal: RH STG ADHERE TO SAFETY PRECAUTIONS W/ASSISTANCE/DEVICE Description: STG Adhere to Safety Precautions With max  Assistance/Device. Outcome: Progressing   Problem: RH PAIN MANAGEMENT Goal: RH STG PAIN MANAGED AT OR BELOW PT'S PAIN GOAL Description: <4 w/ prns Outcome: Progressing   Problem: RH KNOWLEDGE DEFICIT SCI Goal: RH STG INCREASE KNOWLEDGE OF SELF CARE AFTER SCI Description: Manage increase knowledge of self care after SCI with max assistance from spouse/ family using educational materials  provided.  Outcome: Progressing

## 2024-05-08 NOTE — Progress Notes (Signed)
 Occupational Therapy Session Note  Patient Details  Name: Tyrone Mckinney MRN: [id number] Date of Birth: June 06, 1981  Today's Date: 05/08/2024 OT Individual Time: 0930-1040 OT Individual Time Calculation (min): 70 min    Short Term Goals: Week 4:  OT Short Term Goal 1 (Week 4): STG=LTG 2/2 ELOS  Skilled Therapeutic Interventions/Progress Updates:    Pt resting in PWC upon arrival. Skilled OT interventin with focus on PWC mobility, RUE wrist/finger extension, triceps activation, bil pecs strengthening, and activity tolerance to increase independence with BADLs and direct care. All PWC mobility with supervision except min A for backing up.   Saebo Stim One RUE wrist/finger extensors to increase conrol and function for ADLs and PWC mobility-30 mins attended 330 pulse width 35 Hz pulse rate On 8 sec/ off 8 sec Ramp up/ down 2 sec Symmetrical Biphasic wave form  Max intensity at 500 Ohm load  Pt responded well but unable to replicate when Saebo removed  OTA provided support at RUE elbow while pt initiated punching small beach ball. Trace triceps.  Pt squeezed small therapy ball with BUE to strengthen deltoids and pecs-3x10 and 3x10 squeeze and hold.  Pt returned to room and remained in PWC. Pt able to activate sip n puff.     Therapy Documentation Precautions:  Precautions Precautions: Cervical, Fall Precaution/Restrictions Comments: MAP >65 Splint/Cast - Date Prophylactic Dressing Applied (if applicable):  (on) Restrictions Weight Bearing Restrictions Per Provider Order: Yes LUE Weight Bearing Per Provider Order: Touch down weight bearing Other Position/Activity Restrictions: Forearm based thumb spica LUE (per ortho); WHO RUE, B PRAFOs Pain: Pt reports his Lt shoulder feels ok   Therapy/Group: Individual Therapy  [name] 05/08/2024, 11:02 AM

## 2024-05-08 NOTE — Progress Notes (Incomplete)
 PROGRESS NOTE   Subjective/Complaints:  Pt reports L knee hurts with lateral movement- mainly with hip abduction/rolling of L hip- none with ROM of actual knee.  Says taking Oxy ~ q6 hour.  Says L knee pai is mild  ROS: as per HPI.   Pt denies SOB, abd pain, CP, N/V/C/D, and vision changes    Objective:   DG Wrist Complete Left Result Date: 05/07/2024 CLINICAL DATA:  Ulnar styloid process fracture EXAM: LEFT WRIST - COMPLETE 3+ VIEW COMPARISON:  March 31, 2024 FINDINGS: Similar alignment of minimally displaced ulnar styloid process fracture. Unchanged nonspecific calcification projecting over the distal ulna seen in the superficial soft tissues on prior CT. Unchanged punctate calcifications at the radiocarpal joint. No new fractures identified IMPRESSION: Similar alignment of minimally displaced ulnar styloid process fracture. Unchanged radiodense body projecting over the distal ulna, better visualized in the superficial soft tissues on prior CT. Electronically Signed   By: Michaeline Blanch M.D.   On: 05/07/2024 14:24     Recent Labs    05/06/24 0534  WBC 4.9  HGB 11.5*  HCT 34.8*  PLT 284      No results for input(s): NA, K, CL, CO2, GLUCOSE, BUN, CREATININE, CALCIUM  in the last 72 hours.         Intake/Output Summary (Last 24 hours) at 05/08/2024 0835 Last data filed at 05/08/2024 0751 Gross per 24 hour  Intake 460 ml  Output 400 ml  Net 60 ml         Physical Exam: Vital Signs Blood pressure 113/72, pulse 72, temperature 98.2 F (36.8 C), temperature source Oral, resp. rate 18, height 6' 4 (1.93 m), weight 88.9 kg, SpO2 98%.     General: awake, alert, appropriate, sitting up in bed- being turned by therapy/OT; NAD HENT: conjugate gaze; oropharynx moist CV: regular rate and rhythm; no JVD Pulmonary: CTA B/L; no W/R/R- good air movement GI: soft, NT, ND, (+)BS Psychiatric:  appropriate- bright affect Neurological: Ox3 MSK- mild swelling of L knee- no pain or TTP over actual knee or IT band- but elicited pain with abduction and rotation of L hip. Ext: no clubbing, cyanosis, or edema except mild swelling of L knee- maybe mild effusion Psych: pleasant and cooperative  Neuro: A&O x4, memory intact, insight and awareness intact.   PRIOR EXAMS: MSK: generalized tenderness to palpation in shoulders/upper arms with bed mobility and AROM/PROM Skin: minimal drainage from lower aspect of incision, sacral wound dressed.  Neurological:     Mental Status: He is alert.     Comments: Alert and oriented x 3. Normal insight and awareness. Intact Memory. Normal language and speech. Cranial nerve exam unremarkable. MMT: RUE: delt 2+/5, biceps 3- to 3/5, triceps maybe trace, wrist and HI all 0/5.  LUE: delt 1/5, biceps 2/5, triceps, wrist, HI all 0/5. RLE: 1/5 HE and ~1+/5 KE, 0/5 everywhere else. LLE: 2 to 2+/5 HF, HAD and KE, ADF/PF 2+/5. Sensory exam diminished below C5 on right and ~C6 on left. 1/2 sensation in trunk and both LE, as well as inguinal/rectal area. Sensed sl more in LLE. DTR's 1+ in BUE and 3+ in BLE. Pt without resting BLE tone.  Perhaps a beat or two of clonus in either foot.    Assessment/Plan: 1. Functional deficits which require 3+ hours per day of interdisciplinary therapy in a comprehensive inpatient rehab setting. Physiatrist is providing close team supervision and 24 hour management of active medical problems listed below. Physiatrist and rehab team continue to assess barriers to discharge/monitor patient progress toward functional and medical goals  Care Tool:  Bathing        Body parts bathed by helper: Buttocks, Right upper leg, Left upper leg, Right lower leg, Left lower leg, Face, Right arm, Chest, Left arm, Abdomen, Front perineal area     Bathing assist Assist Level: Dependent - Patient 0%     Upper Body Dressing/Undressing Upper body  dressing   What is the patient wearing?: Pull over shirt    Upper body assist Assist Level: Dependent - Patient 0%    Lower Body Dressing/Undressing Lower body dressing      What is the patient wearing?: Incontinence brief, Pants     Lower body assist Assist for lower body dressing: Dependent - Patient 0%     Toileting Toileting    Toileting assist Assist for toileting: Dependent - Patient 0%     Transfers Chair/bed transfer  Transfers assist     Chair/bed transfer assist level: Dependent - mechanical lift     Locomotion Ambulation   Ambulation assist   Ambulation activity did not occur: Safety/medical concerns          Walk 10 feet activity   Assist  Walk 10 feet activity did not occur: Safety/medical concerns        Walk 50 feet activity   Assist Walk 50 feet with 2 turns activity did not occur: Safety/medical concerns         Walk 150 feet activity   Assist Walk 150 feet activity did not occur: Safety/medical concerns         Walk 10 feet on uneven surface  activity   Assist Walk 10 feet on uneven surfaces activity did not occur: Safety/medical concerns         Wheelchair     Assist Is the patient using a wheelchair?: Yes Type of Wheelchair: Power    Wheelchair assist level: Contact Guard/Touching assist, Minimal Assistance - Patient > 75% Max wheelchair distance: 200+    Wheelchair 50 feet with 2 turns activity    Assist        Assist Level: Minimal Assistance - Patient > 75%   Wheelchair 150 feet activity     Assist      Assist Level: Minimal Assistance - Patient > 75%   Blood pressure 113/72, pulse 72, temperature 98.2 F (36.8 C), temperature source Oral, resp. rate 18, height 6' 4 (1.93 m), weight 88.9 kg, SpO2 98%.  Medical Problem List and Plan: 1. 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 Darnella of [facility] on 03/28/24:             -patient may not yet shower             -ELOS/Goals: 05/14/24.  mod to max assist goals             -No collar needed per Dr. Darnella             -  Bilateral PRAFO's, R WHO.  -Will switch PRAFOs to Prevalons due to spasticity D/c date 9/30, power w/c education Con't CIR PT and OT Team conference to f/u on progress D/w OT- about L knee pain- as below #23 2.  Antithrombotics: -DVT/anticoagulation:  Pharmaceutical: Lovenox  30mg  BID 9/1- is (-) for DVT 9/4- needs Elqiuis or Lovenox  for a total of 3 months             -antiplatelet therapy: ASA 81mg  daily 3. Pain Management: Cymbalta  30mg  nightly, Gabapentin  600mg  TID, Tylenol  1000mg  q6h, Robaxin  1000mg  q8h, oxycodone  10-15mg  q4h prn,  -Consider increasing Cymbalta  to 60mg  nightly for improved pain control if needed -Pt developing early hypertonicity in LE's. Will begin scheduling tizanidine  at 2mg  tid to start. Can have 2mg  tid prn as well -04/13/24 decrease tylenol  650mg  QID d/t transminitis as below 9/1- changed Cymbalta  to qday since had nightmares last night- didn't take trazodone . 9/3- still having nightmares, but not med related- might be PTSD? Vs subconscious dealing with his injury? 9/15- pain is improved. Pt satsified 9/18- will increase Cymbalta  to 60 mg daily- to help mood which I'm concerned about as well as pain 9/19 patient reported upper body soreness today. Told me he was satisfied with pain mgt currently 9/23- added voltaren  gel for L knee 4. Mood/Behavior/Sleep: ego support             -antipsychotic agents: n/a             -Psych consult inpatient, no needs at that time -Pt will need emotional support. Seems to be pretty upbeat. Family sounds supportive. Will have Dr. Corina see pt at some point during  admit  9/4- D/w pt and family today that he's very 'bright- but I'm expecting him to hit a wall when all this really hits.  9/19 cymbalta  increased to 60mg  as above 5. Neuropsych/cognition: This patient is capable of making decisions on his own behalf. 6. Skin/Wound Care/large forehead laceration: staples removed 04/04/24.  Routine skin care.  9/8- incision on posterior neck puffy, draining, called Dr Darnella via NP 7. Fluids/Electrolytes/Nutrition: routine I&O, routine labs, continue vitamins/supplements (Ensure plus high protein BID, MVI)             -Regular diet 8. 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 Lester of [facility], continue ASA 81mg  daily 9. Superior endplate compression fx T5, T7, T8, T9, B/L upper thoracic posterior paraspinal muscle injury and edema, interspinous ligament injury T2-3 and possibly T3-4: no intervention needed. Per Dr. Darnella.  10. B/L apical pulmonary contusions, small pulmonary lac RLL: aggressive pulmonary toilet 11. L Ulnar styloid fx: NWB LUE, splint, f/up Dr. Murrell 9/4- when gets closer to 6 weeks, will call Dr Marjorie to go over when can WBAT 9/9- cannot see a surgery done? So 6 weeks from 8/14- called Dr Marjorie- ordered a wrist xray to f/u- and unlikely to be able to [facility] prior to d/c  -05/04/24 it's been just over 6wks now, consider talking to Dr. Murrell this week 9/23- will call today-L/M for Dr Murrell to call me back and see what the next step is- since it's been 6 weeks 12. Orthostatic hypotension: continue Midodrine  20mg  TID, Strattera  25mg  daily also started per Dr. Jyl recs with goal perhaps of coming off midodrine . -continue TEDs and abdominal binder -04/14/24 BPs improving, monitor 9/1-9/2 in general BP's soft, in 100s-110s. Supine, but doing better- con't regimen 9/3-9/4 BP's running low 90's to 110's- no concern about OH currently per  therapy -9/6-7/25 BP occasionally elevated briefly but improves, no AD sxs, monitor 9/8- BP 131 systolic  this AM! Sitting upright 9/15--bp remains very soft. Continue acclimation with PT 9/16- 9/17- BP still very soft, but doing better in last 24 hours- low 100's systolic- -con't regimen 9/18-19- BP's soft but stable  -continue midodrine  and strattera  -9/20-21/25 BPs improving, usually has spike in afternoon, monitor Vitals:   05/05/24 0403 05/05/24 1610 05/05/24 2200 05/06/24 0438  BP: 95/61 122/84 112/78 102/65   05/06/24 1407 05/06/24 1934 05/07/24 0403 05/07/24 1159  BP: (!) 156/98 104/72 100/69 128/78   05/07/24 1300 05/07/24 1929 05/08/24 0606 05/08/24 0743  BP: 108/79 103/76 (!) 103/59 113/72    13. Neurogenic bowel: on  bowel program: Dulcolax suppository 10mg  daily, Miralax  BID, SenokotS 1 tab BID             -will adjust bowel program to 1800 daily             -senokot-s 2 tabs q am and miralax  BID  9/5- no BM last night and none with another bowel program this AM in spite of being done-- will give Sorbitol  60cc now and then SSE this evening with bowel program- ordered -04/21/24 small BM last night-- monitor 9/9- Will give 60cc Sorbitol  at 2pm and SE at 6pm at part of bowel program- cannot add Linzess since isn't insured- might need at home to take Mg citrate every few days- also taking a lot of Oxycodone  as well - hopefully this will decrease over time.  9/11 having response to bowel program but stool too soft  -hold miralax  bid and 3 senna-s's daily for now  -resume them in some form tomorrow 9/12 formed stool last night, resume daily miralax  tomorrow and 2 senna-s tabs today -9/15 had a large T6 bm at 0100 this am 9/16- Had large BM overnight with bowel program 9/17- had large BM at 11:45 pm- 9/18-19 no bm or bowel program documented---I am follow up with nursing. Needs to be on program -9/20-21/25 BM overnight, getting bowel program. Monitor. 9/23- Small BM and minimal results from day bowel program - if no good BM by tomorrow, will do sorbitol   14. Neurogenic bladder:               -adjust I/O cath schedule to volumes of 300-500 cc -04/13/24 apparently used condom cath last night-- does feel urge to urinate, no bladder scans documented but pt says they were done but no ISC needed -Reviewed with nursing the plan for him, no condom cath for now, attempt with urinal q4-6h and bladder scan -ISC if scan >250cc 9/4- still not voiding with toileting 9/5- I still think he's kicking off, but not emptying- he has no feeling of voiding, just leaking-- same 9/6-7, no caths still, but incontinent 9/12-15 pt has been incontinent with low to medium PVR's. He insists that he can feel when he needs to empty and that staff can't get to him in time. He has mentioned not wanting to bother nursing so much.  -continue timed voids q3-4 hours and obsv  -encouraged pt to notify staff when he needs to empty -continue voiding trial/education 9/16- Pt says staff cannot get to him, but mentions the time is very short- like 1-2 minutes at times- of note- con't regimen 9/17-9/19 still kicking off, regularly incontinent. Consider antispasmodic and I/O caths vs condom cath/bag.  15. Polysubstance use disorder: UDS +cocaine, THC, Benzos-- provide counseling 16. 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 F/up outpatient.  17. ABLA: hgb stable 8-9 range, monitor twice weekly  -04/13/24 Hgb 9.6, monitor>> stable 9/4 at 10 18. Transaminitis: -04/13/24 Alk phos 286, AST 58, ALT 153-- could be from shock liver, monitor again on Monday. Decrease tylenol  to 650mg  QID. No abd pain/n/v but consider imaging/work up if those develop.  9/1-  Down to 13 and 119 from 32 and 153- will reduction in Tylenol - -if not better Thursday, will reduce Tylenol  further 9/4- will stop scheduled tylenol  and make 325 mg q6 hours prn if need be 9/8- AST/ALT about the  same- 48/110- and alk phos 213 (alk phos is down substantially)- if not down by Thursday, will reassess 9/9- cannot start Dantrolene with these issues 9/16- AST 42 up from 29 and ALT 95 up from 74- will recheck Thursday- because not taking tylenol - could be baclofen , but I don't know what else can can do for spasticity? 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 - pt had BP yesterday of 166/92- pt admits had HA- so had AD- educated pt and family on AD- will continue to educate staff, because we were not called on the probable AD episode yesterday- 166/92 was BP- isolated spike in BP = AD esp in pt that has OH- went over with PA and staff about how to treat- sit pt up, loosen tight clothing, then if doesn't improve, cath him; do bowel program- that's 80% of all AD causes- if not improved, let PA/Doctor know 9/5- Had BP of 177/92 this AM- likely AD, although no HA- nurse did great catch and tried to do bowel program- no results- will order another round of Sorbitol  60cc with SSE this evening to get him cleaned out- esp with constipation -04/20/24 occasionally elevated BP but improves quickly, no AD symptoms, monitor 9/9- had AD yesterday AM- no HA or nasal congestions but BP -9/12-15 no further AD. Having daily results with bowel program.    20. Spasticity-  9/5- will increase Baclofen  to 10 mg TID_ and first dose at 630am- d/w nursing and therapy that spasticity is progressing- once it starts, it's pretty fast initially- usually starts around 3-4 weeks and progresses, as I educated family about- and unfortunately we will try to keep up, but it's hard- he also has Zanaflex  2 mg TID AND 2 mg TID prn for additional spasms 9/8- has improved spasticity-  9/9- spasms much worse this AM- like didn't get meds, but per chart, received Baclofen  at 546 this AM- will increase Zanaflex  to 4 mg TID-  it's progressing so fast- not sure due to inflammation from mild incision infection- on keflex - can set it off 9/12-15 continue above schedule. Spasticity is MUCH improved compared to admit 9/16-9/19- spasticity much improved 9/23- spasticity controlled per pt- con't regimen 21. 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 If so, will order SLP eval.  22. Wound abscess- in superficial tissues, not actual deep wound infection per Dr Darnella 9/9-11- NSU saw and put on keflex  for 7 days-  will con't to monitor- is still draining- - per pt, Dr Darnella mentioned that if things weren't better at day 7 (9/15 at latest) that to call him back.  9/12-15 wound/drainage appear to be improving--continue with above plan 9/23- assessed wound yesterday- looks open, however didn't appear to have drainage- will reassess.  23. L knee pain  9/23- will add Voltaren  gel 4x/day for L knee    I spent a total of    minutes on total care today- >50% coordination of care- due to    LOS: 26 days A FACE TO FACE EVALUATION WAS PERFORMED  Glendon Fiser 05/08/2024, 8:35 AM

## 2024-05-09 LAB — COMPREHENSIVE METABOLIC PANEL WITH GFR
ALT: 130 U/L — ABNORMAL HIGH (ref 0–44)
AST: 60 U/L — ABNORMAL HIGH (ref 15–41)
Albumin: 2.9 g/dL — ABNORMAL LOW (ref 3.5–5.0)
Alkaline Phosphatase: 182 U/L — ABNORMAL HIGH (ref 38–126)
Anion gap: 8 (ref 5–15)
BUN: 19 mg/dL (ref 6–20)
CO2: 29 mmol/L (ref 22–32)
Calcium: 9.1 mg/dL (ref 8.9–10.3)
Chloride: 101 mmol/L (ref 98–111)
Creatinine, Ser: 0.83 mg/dL (ref 0.61–1.24)
GFR, Estimated: >60 mL/min (ref >60)
Glucose, Bld: 101 mg/dL — ABNORMAL HIGH (ref 70–99)
Potassium: 4.1 mmol/L (ref 3.5–5.1)
Sodium: 138 mmol/L (ref 135–145)
Total Bilirubin: 0.4 mg/dL (ref 0.0–1.2)
Total Protein: 7.1 g/dL (ref 6.5–8.1)

## 2024-05-09 LAB — CBC
HCT: 31.8 % — ABNORMAL LOW (ref 39.0–52.0)
Hemoglobin: 10.4 g/dL — ABNORMAL LOW (ref 13.0–17.0)
MCH: 29.5 pg (ref 26.0–34.0)
MCHC: 32.7 g/dL (ref 30.0–36.0)
MCV: 90.1 fL (ref 80.0–100.0)
Platelets: 286 K/uL (ref 150–400)
RBC: 3.53 MIL/uL — ABNORMAL LOW (ref 4.22–5.81)
RDW: 12.9 % (ref 11.5–15.5)
WBC: 4.9 K/uL (ref 4.0–10.5)
nRBC: 0 % (ref 0.0–0.2)

## 2024-05-09 MED ORDER — SENNOSIDES-DOCUSATE SODIUM 8.6-50 MG PO TABS
3.0000 | ORAL_TABLET | Freq: Every day | ORAL | Status: DC
  Administered 2024-05-10 – 2024-05-14 (×4): 3 via ORAL
  Filled 2024-05-09 (×5): qty 3

## 2024-05-09 NOTE — Progress Notes (Shared)
 IP Rehab Bowel Program Documentation   Bowel Program Start time 1840  Dig Stim Indicated? Yes  Dig Stim Prior to Suppository or mini Enema X 3   Output from dig stim: None   Ordered intervention: Suppository Yes , mini enema No ,   Repeat dig stim after Suppository or Mini enema  X 3,  Output? Minimal   Bowel Program Complete? {YES/NO:21197}, handoff given ***  Patient Tolerated? {YES/NO:21197}

## 2024-05-09 NOTE — Progress Notes (Signed)
 Physical Therapy Session Note  Patient Details  Name: Tyrone Mckinney MRN: [id number] Date of Birth: September 18, 1980  Today's Date: 05/09/2024 PT Individual Time: 0805-0900 PT Individual Time Calculation (min): 55 min   Short Term Goals: Week 4:  PT Short Term Goal 1 (Week 4): STG = LTG due to ELOS  Skilled Therapeutic Interventions/Progress Updates:    Pt presents in room in bed, agreeable to PT. Pt denies pain throughout session. Session focused on therapeutic activities for bed mobility training as well as family education with pt daughter. Pt noted to have incontinence due to poor seal with purewick, requiring increased time for hygiene at start of session. Pt completes rolling bilaterally multiple times with total assist for hygiene and brief management, pt demonstrating improving BLE muscle activation to assist with rolling however continues to require max assist for rolling bilaterally. Pt completes supine to sit with max assist x2 with pt daughter assisting, cues for sequencing and using hospital bed to assist. Pt daughter assist with slideboard transfer with therapist positioned in front, cues for body mechanics provided as well as hand placement, pt able to self direct care with calling out steps for slideboard transfer as well as appropriate placement of slideboard. Pt requires max assist x2 for transfer to [facility]. Pt self propels PWC from room to day room and set up for transfer at [facility]. Therapist educates pt and pt daughter on appropriate positioning for initiating transfer from [facility] to mat with tilting to neutral and decreasing seat elevation. Pt daughter positioned in front with cues from therapist for body mechanics, hand placement and foot placement, 2nd person assist posteriorly for transfer. Pt then completes trnasfer back to [facility] with improved transfer mechanics noted. Pt returns to room via PWC and remains seated in PWC to await OT session at end of session.  Therapy  Documentation Precautions:  Precautions Precautions: Cervical, Fall Precaution/Restrictions Comments: MAP >65 Splint/Cast - Date Prophylactic Dressing Applied (if applicable):  (on) Restrictions Weight Bearing Restrictions Per Provider Order: Yes LUE Weight Bearing Per Provider Order: Touch down weight bearing Other Position/Activity Restrictions: Forearm based thumb spica LUE (per ortho); WHO RUE, B PRAFOs     Therapy/Group: Individual Therapy  Reche Ohara PT, DPT 05/09/2024, 12:22 PM

## 2024-05-09 NOTE — Progress Notes (Signed)
 Occupational Therapy Session Note  Patient Details  Name: Chares Serque Marconi MRN: [id number] Date of Birth: 09/28/1980  Today's Date: 05/09/2024 OT Individual Time: 0900-1000 OT Individual Time Calculation (min): 60 min    Short Term Goals: Week 4:  OT Short Term Goal 1 (Week 4): STG=LTG 2/2 ELOS  Skilled Therapeutic Interventions/Progress Updates:    Skilled OT intervention with focus on family educaiton with dtr. Reviewed wearing schedule and purpose of Ted hose. Demonstrated various methods for donning Ted hose. Recommended use of incontinence pads in bed and provided info for pads and incontinence briefs. Provded handout for use of hoyer lift. Confirmed that family has purchased sling for [employer]. Reviewed recommendation for daily schedule and positioning in bed and PWC. Reviewed AD and handout in [facility] book. Answered all questions. Pt remained in PWC. Dtr and wife present.   Therapy Documentation Precautions:  Precautions Precautions: Cervical, Fall Precaution/Restrictions Comments: MAP >65 Splint/Cast - Date Prophylactic Dressing Applied (if applicable):  (on) Restrictions Weight Bearing Restrictions Per Provider Order: Yes LUE Weight Bearing Per Provider Order: Touch down weight bearing Other Position/Activity Restrictions: Forearm based thumb spica LUE (per ortho); WHO RUE, B PRAFOs    Pain: Pt reports pain in Lt shoulder is ok   Therapy/Group: Individual Therapy  [name] 05/09/2024, 12:32 PM

## 2024-05-09 NOTE — Progress Notes (Signed)
 Recreational Therapy Session Note  Patient Details  Name: Tyrone Mckinney MRN: [id number] Date of Birth: [birth year] Today's Date: 05/09/2024  Pain: c/o 8/10 shoulder pain, nurse aware and providing medications Skilled Therapeutic Interventions/Progress Updates: Session focused on discharge planning/pt education.  Discussion included community resources including transportation, leisure and travel emphasizing quality of life after discharge.  Pt with questions about returning to work.  Encouraged pt to ask  Dr. Cornelio about this as well as about sex & intimacy.  Pt appreciative of this information and stated understanding.  Therapy/Group: Co-Treatment   Sharin Altidor 05/09/2024, 11:43 AM

## 2024-05-09 NOTE — Progress Notes (Signed)
 Patient ID: Tyrone Mckinney, male   DOB: 1981/03/25, 43 y.o.   MRN: [id number]  SW faxed PT referral to [facility] ([phone]/[phone]).  SW emailed PT referral to [facility]- [email or web address] .   Graeme Jude, MSW, LCSW Office: [phone] Cell: [phone] Fax: [phone]

## 2024-05-09 NOTE — Progress Notes (Signed)
 Occupational Therapy Session Note  Patient Details  Name: Tyrone Mckinney MRN: [id number] Date of Birth: 1980-12-09  Today's Date: 05/09/2024 OT Individual Time: 1200-1300 OT Individual Time Calculation (min): 60 min    Short Term Goals: Week 3:  OT Short Term Goal 1 (Week 3): Pt will roll laterally in bed with Max A x1 to assist with ADL routine. OT Short Term Goal 1 - Progress (Week 3): Met OT Short Term Goal 2 (Week 3): Pt will self-feed with Mod A using compensatory techniques/strategies as needed. OT Short Term Goal 2 - Progress (Week 3): Progressing toward goal OT Short Term Goal 3 (Week 3): Pt will bathe face with Mod A using compensatory techniques/strategies as needed. OT Short Term Goal 3 - Progress (Week 3): Progressing toward goal OT Short Term Goal 4 (Week 3): Pt will maintain sitting balance EOB with max A in preparation for TB transfers OT Short Term Goal 4 - Progress (Week 3): Met Week 4:  OT Short Term Goal 1 (Week 4): STG=LTG 2/2 ELOS  Skilled Therapeutic Interventions/Progress Updates:    1:1 Pt received in the power chair. Pt reports he voided urine while sleeping. Used the maxi move and assisted pt back into bed. Focus on rolling with min A once LE are in flexed position. Pt was able to bridge for pants to be pulled up and down. Engaged in self feeding working on shoulder adduction/ abduction with SABEO mobile arm support with OT total A with distal control of holding sandwich and drink bottle. Pt was able to then bring food/ drink to mouth with active shoulder movement assisted against gravity. Pt ate in bed with KREG bed in chair position. Pt left sitting in chair position in bed.   Therapy Documentation Precautions:  Precautions Precautions: Cervical, Fall Precaution/Restrictions Comments: MAP >65 Splint/Cast - Date Prophylactic Dressing Applied (if applicable):  (on) Restrictions Weight Bearing Restrictions Per Provider Order: Yes LUE Weight Bearing Per  Provider Order: Touch down weight bearing Other Position/Activity Restrictions: Forearm based thumb spica LUE (per ortho); WHO RUE, B PRAFOs  Pain: No c/o pain   Therapy/Group: Individual Therapy  Claudene Nest [facility] 05/09/2024, 3:22 PM

## 2024-05-09 NOTE — Progress Notes (Signed)
 Occupational Therapy Session Note  Patient Details  Name: Tyrone Mckinney MRN: [id number] Date of Birth: 1981-08-04  Today's Date: 05/09/2024 OT Individual Time: 1300-1345 OT Individual Time Calculation (min): 45 min    Short Term Goals: Week 4:  OT Short Term Goal 1 (Week 4): STG=LTG 2/2 ELOS  Skilled Therapeutic Interventions/Progress Updates:      Therapy Documentation Precautions:  Precautions Precautions: Cervical, Fall Precaution/Restrictions Comments: MAP >65 Splint/Cast - Date Prophylactic Dressing Applied (if applicable):  (on) Restrictions Weight Bearing Restrictions Per Provider Order: Yes LUE Weight Bearing Per Provider Order: Touch down weight bearing Other Position/Activity Restrictions: Forearm based thumb spica LUE (per ortho); WHO RUE, B PRAFOs General: Pt in chair position in bed upon OT arrival, agreeable to OT session.  Pain:  5/10 pain reported in BUEs, activity, intermittent rest breaks, distractions provided for pain management, pt reports tolerable to proceed.   Other Treatments: OT educating pt on community integration and returning home after obtaining SCI. OT covering many topics such as transportation options, peer mentors on social media, peer mentors in area, SCI support group, traveling, outings into community, Catering manager. OT also brought up topic of sex and referring to MD for initial talk and referring to OT for further questions after speaking with MD. Pt very appreciative of conversation.    Pt in chair position in bed with bed alarm activated, 4 bed rails up for safety, call light within reach and 4Ps assessed.   Therapy/Group: Individual Therapy  Camie Hoe, OTD, OTR/L 05/09/2024, 3:48 PM

## 2024-05-09 NOTE — Progress Notes (Signed)
 PROGRESS NOTE   Subjective/Complaints:  Pt reports having BM's usually overnight after bowel program- not WITH bowel program. [place] had one overnight but NO results with bowel program in bowel program note.  Had medium BM yesterday at 8am, but nothing overnight.   Slept OK- L wrist is really sore this AM- taking pain meds- last dose ~ 5am.    ROS: as per HPI.   Pt denies SOB, abd pain, CP, N/V/C/D, and vision changes    Objective:   DG Wrist Complete Left Result Date: 05/07/2024 CLINICAL DATA:  Ulnar styloid process fracture EXAM: LEFT WRIST - COMPLETE 3+ VIEW COMPARISON:  March 31, 2024 FINDINGS: Similar alignment of minimally displaced ulnar styloid process fracture. Unchanged nonspecific calcification projecting over the distal ulna seen in the superficial soft tissues on prior CT. Unchanged punctate calcifications at the radiocarpal joint. No new fractures identified IMPRESSION: Similar alignment of minimally displaced ulnar styloid process fracture. Unchanged radiodense body projecting over the distal ulna, better visualized in the superficial soft tissues on prior CT. Electronically Signed   By: Michaeline Blanch M.D.   On: 05/07/2024 14:24     Recent Labs    05/09/24 0506  WBC 4.9  HGB 10.4*  HCT 31.8*  PLT 286      Recent Labs    05/09/24 0506  NA 138  K 4.1  CL 101  CO2 29  GLUCOSE 101*  BUN 19  CREATININE 0.83  CALCIUM  9.1           Intake/Output Summary (Last 24 hours) at 05/09/2024 0853 Last data filed at 05/09/2024 0504 Gross per 24 hour  Intake 220 ml  Output 500 ml  Net -280 ml         Physical Exam: Vital Signs Blood pressure 107/68, pulse 69, temperature 98.3 F (36.8 C), temperature source Oral, resp. rate 16, height 6' 4 (1.93 m), weight 88.9 kg, SpO2 99%.      General: awake, alert, appropriate, supine in bed; on Kreg bed; NAD HENT: conjugate gaze; oropharynx moist-  wound on forehead healing slowly CV: regular rate and rhythm; no JVD Pulmonary: CTA B/L; no W/R/R- good air movement GI: soft, NT, ND, (+)BS- hypoactive Psychiatric: appropriate Neurological: Ox3  MSK- mild swelling of L knee- no pain or TTP over actual knee or IT band- but elicited pain with abduction and rotation of L hip. Ext: no clubbing, cyanosis, or edema except mild swelling of L knee- maybe mild effusion Psych: pleasant and cooperative  Neuro: A&O x4, memory intact, insight and awareness intact.   PRIOR EXAMS: MSK: generalized tenderness to palpation in shoulders/upper arms with bed mobility and AROM/PROM Skin: minimal drainage from lower aspect of incision, sacral wound dressed.  Neurological:     Mental Status: He is alert.     Comments: Alert and oriented x 3. Normal insight and awareness. Intact Memory. Normal language and speech. Cranial nerve exam unremarkable. MMT: RUE: delt 2+/5, biceps 3- to 3/5, triceps maybe trace, wrist and HI all 0/5.  LUE: delt 1/5, biceps 2/5, triceps, wrist, HI all 0/5. RLE: 1/5 HE and ~1+/5 KE, 0/5 everywhere else. LLE: 2 to 2+/5 HF, HAD and KE, ADF/PF  2+/5. Sensory exam diminished below C5 on right and ~C6 on left. 1/2 sensation in trunk and both LE, as well as inguinal/rectal area. Sensed sl more in LLE. DTR's 1+ in BUE and 3+ in BLE. Pt without resting BLE tone.  Perhaps a beat or two of clonus in either foot.    Assessment/Plan: 1. Functional deficits which require 3+ hours per day of interdisciplinary therapy in a comprehensive inpatient rehab setting. Physiatrist is providing close team supervision and 24 hour management of active medical problems listed below. Physiatrist and rehab team continue to assess barriers to discharge/monitor patient progress toward functional and medical goals  Care Tool:  Bathing        Body parts bathed by helper: Buttocks, Right upper leg, Left upper leg, Right lower leg, Left lower leg, Face, Right arm,  Chest, Left arm, Abdomen, Front perineal area     Bathing assist Assist Level: Dependent - Patient 0%     Upper Body Dressing/Undressing Upper body dressing   What is the patient wearing?: Pull over shirt    Upper body assist Assist Level: Dependent - Patient 0%    Lower Body Dressing/Undressing Lower body dressing      What is the patient wearing?: Incontinence brief, Pants     Lower body assist Assist for lower body dressing: Dependent - Patient 0%     Toileting Toileting    Toileting assist Assist for toileting: Dependent - Patient 0%     Transfers Chair/bed transfer  Transfers assist     Chair/bed transfer assist level: Dependent - mechanical lift     Locomotion Ambulation   Ambulation assist   Ambulation activity did not occur: Safety/medical concerns          Walk 10 feet activity   Assist  Walk 10 feet activity did not occur: Safety/medical concerns        Walk 50 feet activity   Assist Walk 50 feet with 2 turns activity did not occur: Safety/medical concerns         Walk 150 feet activity   Assist Walk 150 feet activity did not occur: Safety/medical concerns         Walk 10 feet on uneven surface  activity   Assist Walk 10 feet on uneven surfaces activity did not occur: Safety/medical concerns         Wheelchair     Assist Is the patient using a wheelchair?: Yes Type of Wheelchair: Power    Wheelchair assist level: Contact Guard/Touching assist, Minimal Assistance - Patient > 75% Max wheelchair distance: 200+    Wheelchair 50 feet with 2 turns activity    Assist        Assist Level: Minimal Assistance - Patient > 75%   Wheelchair 150 feet activity     Assist      Assist Level: Minimal Assistance - Patient > 75%   Blood pressure 107/68, pulse 69, temperature 98.3 F (36.8 C), temperature source Oral, resp. rate 16, height 6' 4 (1.93 m), weight 88.9 kg, SpO2 99%.  Medical Problem List  and Plan: 1. 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 Darnella of [facility] on 03/28/24:             -patient may not yet shower             -  ELOS/Goals: 05/14/24.  mod to max assist goals             -No collar needed per Dr. Darnella             -Bilateral PRAFO's, R WHO.  -Will switch PRAFOs to Prevalons due to spasticity D/c date 9/30, power w/c education Con't CIR PT and OT Educated that he's supposed to have BM with bowel program, not afterwards- also educated that if don't do at home, which was planned, will go into AD- which can be very dangerous- he voiced understanding 2.  Antithrombotics: -DVT/anticoagulation:  Pharmaceutical: Lovenox  30mg  BID 9/1- is (-) for DVT 9/4- needs Elqiuis or Lovenox  for a total of 3 months             -antiplatelet therapy: ASA 81mg  daily 3. Pain Management: Cymbalta  30mg  nightly, Gabapentin  600mg  TID, Tylenol  1000mg  q6h, Robaxin  1000mg  q8h, oxycodone  10-15mg  q4h prn,  -Consider increasing Cymbalta  to 60mg  nightly for improved pain control if needed -Pt developing early hypertonicity in LE's. Will begin scheduling tizanidine  at 2mg  tid to start. Can have 2mg  tid prn as well -04/13/24 decrease tylenol  650mg  QID d/t transminitis as below 9/1- changed Cymbalta  to qday since had nightmares last night- didn't take trazodone . 9/3- still having nightmares, but not med related- might be PTSD? Vs subconscious dealing with his injury? 9/15- pain is improved. Pt satsified 9/18- will increase Cymbalta  to 60 mg daily- to help mood which I'm concerned about as well as pain 9/19 patient reported upper body soreness today. Told me he was satisfied with pain mgt currently 9/23- added voltaren  gel for L knee 9/25-  pain mgmt working adequately- con't regimen 4. Mood/Behavior/Sleep: ego support             -antipsychotic agents: n/a             -Psych consult inpatient, no needs at that time -Pt will need emotional support. Seems to be pretty upbeat. Family sounds supportive. Will have Dr. Corina see pt at some point during admit  9/4- D/w pt and family today that he's very 'bright- but I'm expecting him to hit a wall when all this really hits.  9/19 cymbalta  increased to 60mg  as above 5. Neuropsych/cognition: This patient is capable of making decisions on his own behalf. 6. Skin/Wound Care/large forehead laceration: staples removed 04/04/24.  Routine skin care.  9/8- incision on posterior neck puffy, draining, called Dr Darnella via NP 7. Fluids/Electrolytes/Nutrition: routine I&O, routine labs, continue vitamins/supplements (Ensure plus high protein BID, MVI)             -Regular diet 8. 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 Lester of [facility], continue ASA 81mg  daily 9. Superior endplate compression fx T5, T7, T8, T9, B/L upper thoracic posterior paraspinal muscle injury and edema, interspinous ligament injury T2-3 and possibly T3-4: no intervention needed. Per Dr. Darnella.  10. B/L apical pulmonary contusions, small pulmonary lac RLL: aggressive pulmonary toilet 11. L Ulnar styloid fx: NWB LUE, splint, f/up Dr. Murrell 9/4- when gets closer to 6 weeks, will call Dr Marjorie to go over when can WBAT 9/9- cannot see a surgery done? So 6 weeks from 8/14- called Dr Marjorie- ordered a wrist xray to f/u- and unlikely to be able to [facility] prior to d/c  -05/04/24 it's been just over 6wks now, consider talking to Dr. Murrell this week 9/23- will call today-L/M for Dr Murrell to call me back and see what the next  step is- since it's been 6 weeks 9/25- called and got xrays- pending Dr Michel 12. Orthostatic hypotension: continue Midodrine  20mg  TID, Strattera  25mg  daily also started per Dr. Jyl recs with goal  perhaps of coming off midodrine . -continue TEDs and abdominal binder -04/14/24 BPs improving, monitor 9/1-9/2 in general BP's soft, in 100s-110s. Supine, but doing better- con't regimen 9/3-9/4 BP's running low 90's to 110's- no concern about OH currently per therapy -9/6-7/25 BP occasionally elevated briefly but improves, no AD sxs, monitor 9/8- BP 131 systolic this AM! Sitting upright 9/15--bp remains very soft. Continue acclimation with PT 9/16- 9/17- BP still very soft, but doing better in last 24 hours- low 100's systolic- -con't regimen 9/18-19- BP's soft but stable  -continue midodrine  and strattera  -9/25-  BPs improving, usually has spike in afternoon, monitor Vitals:   05/06/24 0438 05/06/24 1407 05/06/24 1934 05/07/24 0403  BP: 102/65 (!) 156/98 104/72 100/69   05/07/24 1159 05/07/24 1300 05/07/24 1929 05/08/24 0606  BP: 128/78 108/79 103/76 (!) 103/59   05/08/24 0743 05/08/24 1300 05/08/24 1957 05/09/24 0444  BP: 113/72 (!) 140/88 110/78 107/68    13. Neurogenic bowel: on  bowel program: Dulcolax suppository 10mg  daily, Miralax  BID, SenokotS 1 tab BID             -will adjust bowel program to 1800 daily             -senokot-s 2 tabs q am and miralax  BID  9/5- no BM last night and none with another bowel program this AM in spite of being done-- will give Sorbitol  60cc now and then SSE this evening with bowel program- ordered -04/21/24 small BM last night-- monitor 9/9- Will give 60cc Sorbitol  at 2pm and SE at 6pm at part of bowel program- cannot add Linzess since isn't insured- might need at home to take Mg citrate every few days- also taking a lot of Oxycodone  as well - hopefully this will decrease over time.  9/11 having response to bowel program but stool too soft  -hold miralax  bid and 3 senna-s's daily for now  -resume them in some form tomorrow 9/12 formed stool last night, resume daily miralax  tomorrow and 2 senna-s tabs today -9/15 had a large T6 bm at 0100 this  am 9/16- Had large BM overnight with bowel program 9/17- had large BM at 11:45 pm- 9/18-19 no bm or bowel program documented---I am follow up with nursing. Needs to be on program -9/20-21/25 BM overnight, getting bowel program. Monitor. 9/23- Small BM and minimal results from day bowel program - if no good BM by tomorrow, will do sorbitol   14. Neurogenic bladder:              -adjust I/O cath schedule to volumes of 300-500 cc -04/13/24 apparently used condom cath last night-- does feel urge to urinate, no bladder scans documented but pt says they were done but no ISC needed -Reviewed with nursing the plan for him, no condom cath for now, attempt with urinal q4-6h and bladder scan -ISC if scan >250cc 9/4- still not voiding with toileting 9/5- I still think he's kicking off, but not emptying- he has no feeling of voiding, just leaking-- same 9/6-7, no caths still, but incontinent 9/12-15 pt has been incontinent with low to medium PVR's. He insists that he can feel when he needs to empty and that staff can't get to him in time. He has mentioned not wanting to bother nursing so much.  -continue timed voids  q3-4 hours and obsv  -encouraged pt to notify staff when he needs to empty -continue voiding trial/education 9/16- Pt says staff cannot get to him, but mentions the time is very short- like 1-2 minutes at times- of note- con't regimen 9/17-9/19 still kicking off, regularly incontinent. Consider antispasmodic and I/O caths vs condom cath/bag.  15. Polysubstance use disorder: UDS +cocaine, THC, Benzos-- provide counseling 16. 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 F/up outpatient.  17. ABLA: hgb stable 8-9 range, monitor twice weekly  -04/13/24 Hgb 9.6, monitor>> stable 9/4 at 10 18. Transaminitis: -04/13/24 Alk phos 286, AST 58,  ALT 153-- could be from shock liver, monitor again on Monday. Decrease tylenol  to 650mg  QID. No abd pain/n/v but consider imaging/work up if those develop.  9/1-  Down to 42 and 119 from 5 and 153- will reduction in Tylenol - -if not better Thursday, will reduce Tylenol  further 9/4- will stop scheduled tylenol  and make 325 mg q6 hours prn if need be 9/8- AST/ALT about the same- 48/110- and alk phos 213 (alk phos is down substantially)- if not down by Thursday, will reassess 9/9- cannot start Dantrolene with these issues 9/16- AST 42 up from 29 and ALT 95 up from 74- will recheck Thursday- because not taking tylenol - could be baclofen , but I don't know what else can can do for spasticity? 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 - pt had BP yesterday of 166/92- pt admits had HA- so had AD- educated pt and family on AD- will continue to educate staff, because we were not called on the probable AD episode yesterday- 166/92 was BP- isolated spike in BP = AD esp in pt that has OH- went over with PA and staff about how to treat- sit pt up, loosen tight clothing, then if doesn't improve, cath him; do bowel program- that's 80% of all AD causes- if not improved, let PA/Doctor know 9/5- Had BP of 177/92 this AM- likely AD, although no HA- nurse did great catch and tried to do bowel program- no results- will order another round of Sorbitol  60cc with SSE this evening to get him cleaned out- esp with constipation -04/20/24 occasionally elevated BP but improves quickly, no AD symptoms, monitor 9/9- had AD yesterday AM- no HA or nasal congestions but BP -9/12-15 no further AD. Having daily results with bowel program.    20. Spasticity-  9/5- will increase Baclofen  to 10 mg TID_ and first dose at 630am- d/w nursing and therapy that spasticity is progressing- once it starts, it's pretty fast initially-  usually starts around 3-4 weeks and progresses, as I educated family about- and unfortunately we will try to keep up, but it's hard- he also has Zanaflex  2 mg TID AND 2 mg TID prn for additional spasms 9/8- has improved spasticity-  9/9- spasms much worse this AM- like didn't get meds, but per chart, received Baclofen  at 546 this AM- will increase Zanaflex  to 4 mg TID- it's progressing so fast- not sure due to inflammation from mild incision infection- on keflex - can set it off 9/12-15 continue above schedule. Spasticity is MUCH improved compared to admit 9/16-9/19- spasticity much improved 9/23- spasticity controlled per pt- con't regimen 21. 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 If so, will order SLP eval.  22. Wound abscess- in superficial tissues, not actual deep wound infection per Dr Darnella 9/9-11- NSU saw and put on keflex  for 7 days- will con't to monitor- is still draining- - per pt, Dr Darnella mentioned that if things weren't better at day 7 (9/15 at latest) that to call him back.  9/12-15 wound/drainage appear to be improving--continue with above plan 9/23- assessed wound yesterday- looks open, however didn't appear to have drainage- will reassess.  23. L knee pain  9/23- will add Voltaren  gel 4x/day for L knee    I spent a total of 36   minutes on total care today- >50% coordination of care- due to   D/w pt and education about bowel program and need to continue at home- also goal to have BM WITH bowel program- calling Dr Murrell to determine plan of wrist.   LOS: 27 days A FACE TO FACE EVALUATION WAS PERFORMED  Minami Arriaga 05/09/2024, 8:53 AM

## 2024-05-10 MED ORDER — HYDROCERIN EX CREA
TOPICAL_CREAM | Freq: Every day | CUTANEOUS | Status: DC
  Filled 2024-05-10: qty 113

## 2024-05-10 NOTE — Progress Notes (Shared)
 IP Rehab Bowel Program Documentation   Bowel Program Start time [phone]  Dig Stim Indicated? Yes  Dig Stim Prior to Suppository or mini Enema X 3   Output from dig stim: Moderate  Ordered intervention: Suppository Yes , mini enema No ,   Repeat dig stim after Suppository or Mini enema  X 3,  Output? {Desc; minimal/small/moderate/large/very large:110034}   Bowel Program Complete? {YES/NO:21197}, handoff given ***  Patient Tolerated? {YES/NO:21197}

## 2024-05-10 NOTE — Progress Notes (Signed)
 Physical Therapy Session Note  Patient Details  Name: Tyrone Mckinney MRN: [id number] Date of Birth: 1981-03-04  Today's Date: 05/10/2024 PT Individual Time: 0805-0900 PT Individual Time Calculation (min): 55 min   Short Term Goals: Week 4:  PT Short Term Goal 1 (Week 4): STG = LTG due to ELOS  Skilled Therapeutic Interventions/Progress Updates:    Pt presents in room in bed, agreeable to PT. Pt denies pain at this time. Session focused on therapeutic activities to facilitate improved participation with self care tasks as well as bed mobility and transfers. Therapist doffs purewick, dons TED hose, pants, and shoes total assist. Pt completes therex with BLE with pt BLEs in hooklying for hip abductor/adductor strength with pt demonstrating hip abductor/adductor strength 3-/5 on L, 2-/5 on R with emerging muscle fiber recruitment, x5 reps BLE for each exercise. Pt completes bed mobility with max assist for BLE management off bed, assist x2 for supine to sit EOB with trunk management. Pt requires max assist x2 for slideboard transfer bed to [facility] and max assist for repositioning in PWC. Pt positioned at sink with therapist providing max assist for oral hygiene and face hygiene. Therapist provides max assist for changing shirt, provided with cues for directing pt care for sequencing with donning/doffing shirt with pt verbalizing understanding. Pt remains seated in PWC with handoff to OT at end of session.  Therapy Documentation Precautions:  Precautions Precautions: Cervical, Fall Precaution/Restrictions Comments: MAP >65 Splint/Cast - Date Prophylactic Dressing Applied (if applicable):  (on) Restrictions Weight Bearing Restrictions Per Provider Order: Yes LUE Weight Bearing Per Provider Order: Touch down weight bearing Other Position/Activity Restrictions: Forearm based thumb spica LUE (per ortho); WHO RUE, B PRAFOs    Therapy/Group: Individual Therapy  Reche Ohara PT, DPT 05/10/2024,  4:01 PM

## 2024-05-10 NOTE — Progress Notes (Signed)
 PROGRESS NOTE   Subjective/Complaints:  Pt reports he's doing well- -spasms under control.  LBM overnight.  Slept well.   ROS: as per HPI.    Pt denies SOB, abd pain, CP, N/V/C/D, and vision changes    Objective:   No results found.    Recent Labs    05/09/24 0506  WBC 4.9  HGB 10.4*  HCT 31.8*  PLT 286      Recent Labs    05/09/24 0506  NA 138  K 4.1  CL 101  CO2 29  GLUCOSE 101*  BUN 19  CREATININE 0.83  CALCIUM  9.1           Intake/Output Summary (Last 24 hours) at 05/10/2024 1001 Last data filed at 05/10/2024 0914 Gross per 24 hour  Intake 120 ml  Output 350 ml  Net -230 ml         Physical Exam: Vital Signs Blood pressure 93/63, pulse 75, temperature 99.1 F (37.3 C), temperature source Oral, resp. rate 16, height 6' 4 (1.93 m), weight 88.9 kg, SpO2 98%.       General: awake, alert, appropriate, siting up in bed; nurse in room; NAD HENT: conjugate gaze; oropharynx moist CV: regular rate and rhythm; no JVD Pulmonary: CTA B/L; no W/R/R- good air movement GI: soft, NT, ND, (+)BS Psychiatric: appropriate- bright affect Neurological: Ox3   MSK- mild swelling of L knee- no pain or TTP over actual knee or IT band- but elicited pain with abduction and rotation of L hip. Ext: no clubbing, cyanosis, or edema except mild swelling of L knee- maybe mild effusion Psych: pleasant and cooperative  Neuro: A&O x4, memory intact, insight and awareness intact.   PRIOR EXAMS: MSK: generalized tenderness to palpation in shoulders/upper arms with bed mobility and AROM/PROM Skin: minimal drainage from lower aspect of incision, sacral wound dressed.  Neurological:     Mental Status: He is alert.     Comments: Alert and oriented x 3. Normal insight and awareness. Intact Memory. Normal language and speech. Cranial nerve exam unremarkable. MMT: RUE: delt 2+/5, biceps 3- to 3/5, triceps maybe  trace, wrist and HI all 0/5.  LUE: delt 1/5, biceps 2/5, triceps, wrist, HI all 0/5. RLE: 1/5 HE and ~1+/5 KE, 0/5 everywhere else. LLE: 2 to 2+/5 HF, HAD and KE, ADF/PF 2+/5. Sensory exam diminished below C5 on right and ~C6 on left. 1/2 sensation in trunk and both LE, as well as inguinal/rectal area. Sensed sl more in LLE. DTR's 1+ in BUE and 3+ in BLE. Pt without resting BLE tone.  Perhaps a beat or two of clonus in either foot.    Assessment/Plan: 1. Functional deficits which require 3+ hours per day of interdisciplinary therapy in a comprehensive inpatient rehab setting. Physiatrist is providing close team supervision and 24 hour management of active medical problems listed below. Physiatrist and rehab team continue to assess barriers to discharge/monitor patient progress toward functional and medical goals  Care Tool:  Bathing        Body parts bathed by helper: Buttocks, Right upper leg, Left upper leg, Right lower leg, Left lower leg, Face, Right arm, Chest, Left arm, Abdomen, Front  perineal area     Bathing assist Assist Level: Dependent - Patient 0%     Upper Body Dressing/Undressing Upper body dressing   What is the patient wearing?: Pull over shirt    Upper body assist Assist Level: Dependent - Patient 0%    Lower Body Dressing/Undressing Lower body dressing      What is the patient wearing?: Incontinence brief, Pants     Lower body assist Assist for lower body dressing: Dependent - Patient 0%     Toileting Toileting    Toileting assist Assist for toileting: Dependent - Patient 0%     Transfers Chair/bed transfer  Transfers assist     Chair/bed transfer assist level: Dependent - mechanical lift     Locomotion Ambulation   Ambulation assist   Ambulation activity did not occur: Safety/medical concerns          Walk 10 feet activity   Assist  Walk 10 feet activity did not occur: Safety/medical concerns        Walk 50 feet  activity   Assist Walk 50 feet with 2 turns activity did not occur: Safety/medical concerns         Walk 150 feet activity   Assist Walk 150 feet activity did not occur: Safety/medical concerns         Walk 10 feet on uneven surface  activity   Assist Walk 10 feet on uneven surfaces activity did not occur: Safety/medical concerns         Wheelchair     Assist Is the patient using a wheelchair?: Yes Type of Wheelchair: Power    Wheelchair assist level: Contact Guard/Touching assist, Minimal Assistance - Patient > 75% Max wheelchair distance: 200+    Wheelchair 50 feet with 2 turns activity    Assist        Assist Level: Minimal Assistance - Patient > 75%   Wheelchair 150 feet activity     Assist      Assist Level: Minimal Assistance - Patient > 75%   Blood pressure 93/63, pulse 75, temperature 99.1 F (37.3 C), temperature source Oral, resp. rate 16, height 6' 4 (1.93 m), weight 88.9 kg, SpO2 98%.  Medical Problem List and Plan: 1. 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 Darnella of [facility] on 03/28/24:             -patient may not yet shower             -ELOS/Goals: 05/14/24.  mod to max assist goals             -No collar needed per Dr. Darnella             -Bilateral PRAFO's, R WHO.  -Will switch PRAFOs to Prevalons due to spasticity D/c date 9/30, power w/c education Educated that he's supposed to have BM with bowel program, not afterwards- also educated that if don't do at home, which was planned, will go into AD- which can be very dangerous- he voiced understanding Con't CIR PT and OT 2.  Antithrombotics: -DVT/anticoagulation:  Pharmaceutical: Lovenox  30mg  BID 9/1- is (-) for  DVT 9/4- needs Elqiuis or Lovenox  for a total of 3 months ideally-  9/26- will go home on 1 month of Lovenox  or Eliquis-  hopefully Medicaid will kick in by then, and can get another 2- 4 weeks of meds             -antiplatelet therapy: ASA 81mg  daily 3. Pain Management: Cymbalta  30mg  nightly, Gabapentin  600mg  TID, Tylenol  1000mg  q6h, Robaxin  1000mg  q8h, oxycodone  10-15mg  q4h prn,  -Consider increasing Cymbalta  to 60mg  nightly for improved pain control if needed -Pt developing early hypertonicity in LE's. Will begin scheduling tizanidine  at 2mg  tid to start. Can have 2mg  tid prn as well -04/13/24 decrease tylenol  650mg  QID d/t transminitis as below 9/1- changed Cymbalta  to qday since had nightmares last night- didn't take trazodone . 9/3- still having nightmares, but not med related- might be PTSD? Vs subconscious dealing with his injury? 9/15- pain is improved. Pt satsified 9/18- will increase Cymbalta  to 60 mg daily- to help mood which I'm concerned about as well as pain 9/19 patient reported upper body soreness today. Told me he was satisfied with pain mgt currently 9/23- added voltaren  gel for L knee 9/25- 9/26- pain mgmt working adequately- con't regimen 4. Mood/Behavior/Sleep: ego support             -antipsychotic agents: n/a             -Psych consult inpatient, no needs at that time -Pt will need emotional support. Seems to be pretty upbeat. Family sounds supportive. Will have Dr. Corina see pt at some point during admit  9/4- D/w pt and family today that he's very 'bright- but I'm expecting him to hit a wall when all this really hits.  9/19 cymbalta  increased to 60mg  as above 5. Neuropsych/cognition: This patient is capable of making decisions on his own behalf. 6. Skin/Wound Care/large forehead laceration: staples removed 04/04/24.  Routine skin care.  9/8- incision on posterior neck puffy, draining, called Dr Darnella via NP 7. Fluids/Electrolytes/Nutrition: routine I&O, routine  labs, continue vitamins/supplements (Ensure plus high protein BID, MVI)             -Regular diet 8. 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 Lester of [facility], continue ASA 81mg  daily 9. Superior endplate compression fx T5, T7, T8, T9, B/L upper thoracic posterior paraspinal muscle injury and edema, interspinous ligament injury T2-3 and possibly T3-4: no intervention needed. Per Dr. Darnella.  10. B/L apical pulmonary contusions, small pulmonary lac RLL: aggressive pulmonary toilet 11. L Ulnar styloid fx: NWB LUE, splint, f/up Dr. Murrell 9/4- when gets closer to 6 weeks, will call Dr Marjorie to go over when can WBAT 9/9- cannot see a surgery done? So 6 weeks from 8/14- called Dr Marjorie- ordered a wrist xray to f/u- and unlikely to be able to [facility] prior to d/c  -05/04/24 it's been just over 6wks now, consider talking to Dr. Murrell this week 9/23- will call today-L/M for Dr Murrell to call me back and see what the next step is- since it's been 6 weeks 9/25- called and got xrays- pending Dr Murrell 9/26- will have NP call and double check plan 12. Orthostatic hypotension: continue Midodrine  20mg  TID, Strattera  25mg  daily also started per Dr. Jyl recs with goal perhaps of coming off midodrine . -continue TEDs and abdominal binder -04/14/24 BPs improving, monitor 9/1-9/2 in general BP's soft, in 100s-110s. Supine, but doing better- con't regimen 9/3-9/4 BP's running low 90's to 110's- no concern about OH currently per therapy -9/6-7/25 BP occasionally elevated briefly but improves, no AD sxs, monitor 9/8- BP 131 systolic this AM! Sitting upright 9/15--bp remains very  soft. Continue acclimation with PT 9/16- 9/17- BP still very soft, but doing better in last 24 hours- low 100's systolic- -con't regimen 9/18-19- BP's soft but stable  -continue midodrine  and strattera  -9/25-9/26  BPs improving, usually has spike in afternoon, monitor Vitals:   05/07/24 0403 05/07/24 1159 05/07/24 1300 05/07/24 1929   BP: 100/69 128/78 108/79 103/76   05/08/24 0606 05/08/24 0743 05/08/24 1300 05/08/24 1957  BP: (!) 103/59 113/72 (!) 140/88 110/78   05/09/24 0444 05/09/24 1425 05/09/24 1943 05/10/24 0309  BP: 107/68 121/75 105/77 93/63    13. Neurogenic bowel: on  bowel program: Dulcolax suppository 10mg  daily, Miralax  BID, SenokotS 1 tab BID             -will adjust bowel program to 1800 daily             -senokot-s 2 tabs q am and miralax  BID  9/5- no BM last night and none with another bowel program this AM in spite of being done-- will give Sorbitol  60cc now and then SSE this evening with bowel program- ordered -04/21/24 small BM last night-- monitor 9/9- Will give 60cc Sorbitol  at 2pm and SE at 6pm at part of bowel program- cannot add Linzess since isn't insured- might need at home to take Mg citrate every few days- also taking a lot of Oxycodone  as well - hopefully this will decrease over time.  9/11 having response to bowel program but stool too soft  -hold miralax  bid and 3 senna-s's daily for now  -resume them in some form tomorrow 9/12 formed stool last night, resume daily miralax  tomorrow and 2 senna-s tabs today -9/15 had a large T6 bm at 0100 this am 9/16- Had large BM overnight with bowel program 9/17- had large BM at 11:45 pm- 9/18-19 no bm or bowel program documented---I am follow up with nursing. Needs to be on program -9/20-21/25 BM overnight, getting bowel program. Monitor. 9/23- Small BM and minimal results from day bowel program - if no good BM by tomorrow, will do sorbitol   9/26- Small BM overnight- if no results or small results will need Sorbitol  on Saturday 14. Neurogenic bladder:              -adjust I/O cath schedule to volumes of 300-500 cc -04/13/24 apparently used condom cath last night-- does feel urge to urinate, no bladder scans documented but pt says they were done but no ISC needed -Reviewed with nursing the plan for him, no condom cath for now, attempt with urinal q4-6h  and bladder scan -ISC if scan >250cc 9/4- still not voiding with toileting 9/5- I still think he's kicking off, but not emptying- he has no feeling of voiding, just leaking-- same 9/6-7, no caths still, but incontinent 9/12-15 pt has been incontinent with low to medium PVR's. He insists that he can feel when he needs to empty and that staff can't get to him in time. He has mentioned not wanting to bother nursing so much.  -continue timed voids q3-4 hours and obsv  -encouraged pt to notify staff when he needs to empty -continue voiding trial/education 9/16- Pt says staff cannot get to him, but mentions the time is very short- like 1-2 minutes at times- of note- con't regimen 9/17-9/19 still kicking off, regularly incontinent. Consider antispasmodic and I/O caths vs condom cath/bag.  15. Polysubstance use disorder: UDS +cocaine, THC, Benzos-- provide counseling 16. 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 F/up outpatient.  17. ABLA: hgb stable 8-9 range, monitor twice weekly  -04/13/24 Hgb 9.6, monitor>> stable 9/4 at 10 18. Transaminitis: -04/13/24 Alk phos 286, AST 58, ALT 153-- could be from shock liver, monitor again on Monday. Decrease tylenol  to 650mg  QID. No abd pain/n/v but consider imaging/work up if those develop.  9/1-  Down to 42 and 119 from 30 and 153- will reduction in Tylenol - -if not better Thursday, will reduce Tylenol  further 9/4- will stop scheduled tylenol  and make 325 mg q6 hours prn if need be 9/8- AST/ALT about the same- 48/110- and alk phos 213 (alk phos is down substantially)- if not down by Thursday, will reassess 9/9- cannot start Dantrolene with these issues 9/16- AST 42 up from 29 and ALT 95 up from 74- will recheck Thursday- because not taking tylenol - could be baclofen , but I don't know what else can  can do for spasticity? 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 - pt had BP yesterday of 166/92- pt admits had HA- so had AD- educated pt and family on AD- will continue to educate staff, because we were not called on the probable AD episode yesterday- 166/92 was BP- isolated spike in BP = AD esp in pt that has OH- went over with PA and staff about how to treat- sit pt up, loosen tight clothing, then if doesn't improve, cath him; do bowel program- that's 80% of all AD causes- if not improved, let PA/Doctor know 9/5- Had BP of 177/92 this AM- likely AD, although no HA- nurse did great catch and tried to do bowel program- no results- will order another round of Sorbitol  60cc with SSE this evening to get him cleaned out- esp with constipation -04/20/24 occasionally elevated BP but improves quickly, no AD symptoms, monitor 9/9- had AD yesterday AM- no HA or nasal congestions but BP -9/12-15 no further AD. Having daily results with bowel program.    20. Spasticity-  9/5- will increase Baclofen  to 10 mg TID_ and first dose at 630am- d/w nursing and therapy that spasticity is progressing- once it starts, it's pretty fast initially- usually starts around 3-4 weeks and progresses, as I educated family about- and unfortunately we will try to keep up, but it's hard- he also has Zanaflex  2 mg TID AND 2 mg TID prn for additional spasms 9/8- has improved spasticity-  9/9- spasms much worse this AM- like didn't get meds, but per chart, received Baclofen  at 546 this AM- will increase Zanaflex  to 4 mg TID- it's progressing so fast- not sure due to inflammation from mild incision infection- on keflex - can set it off 9/12-15 continue above schedule. Spasticity is MUCH improved compared to admit 9/16-9/19-  spasticity much improved 9/26- spasticity controlled per pt- con't regimen 21. 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 If so, will order SLP eval.  22. Wound abscess- in superficial tissues, not actual  deep wound infection per Dr Darnella 9/9-11- NSU saw and put on keflex  for 7 days- will con't to monitor- is still draining- - per pt, Dr Darnella mentioned that if things weren't better at day 7 (9/15 at latest) that to call him back.  9/12-15 wound/drainage appear to be improving--continue with above plan 9/23- assessed wound yesterday- looks open, however didn't appear to have drainage- will reassess.  23. L knee pain  9/23- will add Voltaren  gel 4x/day for L knee  9/26- hasn't been complaining of knee pain since started    I spent a total of 37    minutes on total care today- >50% coordination of care- due to  D/w pt about Bowel/bladder and spasticity- d/w nursing as well- also NP to call Dr Murrell about L wrist WB status  LOS: 28 days A FACE TO FACE EVALUATION WAS PERFORMED  Carolle Ishii 05/10/2024, 10:01 AM

## 2024-05-10 NOTE — Discharge Instructions (Addendum)
   Inpatient Rehab Discharge Instructions  Tyrone Mckinney Discharge date and time:    Activities/Precautions/ Functional Status: Activity: no lifting, driving, or strenuous exercise for till cleared by MD Diet: regular diet Wound Care:   Functional status:  ___ No restrictions     ___ Walk up steps independently _X__ 24/7 supervision/assistance   ___ Walk up steps with assistance ___ Intermittent supervision/assistance  ___ Bathe/dress independently ___ Walk with walker     _X__ Bathe/dress with assistance ___ Walk Independently    ___ Shower independently ___ Walk with assistance    ___ Shower with assistance _X__ No alcohol     ___ Return to work/school ________  Special Instructions: Need to avoid tylenol , ibuprofen and alcohol   COMMUNITY REFERRALS UPON DISCHARGE:    Home Health:   Please be sure to use home exercise program provided.   Outpatient: PT              Agency: 2 Phone:               Appointment Date/Time: Be sure to call to check the status of your application.  Medical Equipment/Items Ordered:sling and bariatric drop arm bedside commode                                                 Agency/Supplier:[employer] [phone]  GENERAL COMMUNITY RESOURCES FOR PATIENT/FAMILY:  To check the status of  transportation application by contacting-  [employer] at [phone], Option 2 or email [email or web address].   My questions have been answered and I understand these instructions. I will adhere to these goals and the provided educational materials after my discharge from the hospital.  Patient/Caregiver Signature _______________________________ Date __________  Clinician Signature _______________________________________ Date __________  Please bring this form and your medication list with you to all your follow-up doctor's appointments.

## 2024-05-10 NOTE — Progress Notes (Signed)
 Occupational Therapy Session Note  Patient Details  Name: Tyrone Mckinney MRN: [id number] Date of Birth: [birth year]  Today's Date: 05/10/2024 OT Individual Time: 0900-1015 OT Individual Time Calculation (min): 75 min    Short Term Goals: Week 1:  OT Short Term Goal 1 (Week 1): Pt will roll laterally in bed with Max A x1 to assist with ADL routine. OT Short Term Goal 1 - Progress (Week 1): Progressing toward goal OT Short Term Goal 2 (Week 1): Pt will bathe face with Mod A using compensatory techniques/strategies as needed. OT Short Term Goal 2 - Progress (Week 1): Progressing toward goal OT Short Term Goal 3 (Week 1): Pt will self-feed with Mod A using compensatory techniques/strategies as needed. OT Short Term Goal 3 - Progress (Week 1): Progressing toward goal Week 2:  OT Short Term Goal 1 (Week 2): Pt will roll laterally in bed with Max A x1 to assist with ADL routine. OT Short Term Goal 1 - Progress (Week 2): Progressing toward goal OT Short Term Goal 2 (Week 2): Pt will bathe face with Mod A using compensatory techniques/strategies as needed. OT Short Term Goal 2 - Progress (Week 2): Progressing toward goal OT Short Term Goal 3 (Week 2): Pt will self-feed with Mod A using compensatory techniques/strategies as needed. OT Short Term Goal 3 - Progress (Week 2): Progressing toward goal  Skilled Therapeutic Interventions/Progress Updates:    1:1 Pt received from PT and was up in the chair. Pt reports his family is not here for fam education and his wife is working. Pt also reported that his switch to power on/ off the w/c is not working. Decided to take a shower and talk about bathing at home and home modifications he would need if he was going to use a roll in shower chair. Pt lifted with hoyer lift into shower chair and clothing doffed with total A. Daughter did practiced donning the lift pad in prep for transfer.  Pt became incontinent of urine sitting in chair chair. Pt was able to  bathe his abdomen with his right hand with placement of cloth on hand. Plan was for daughter to assist with dressing at bed level but pt reports he wants his wife to perform this task instead of her. PT donned shirt in sitting position in the chair and lift pad place. Pt lifted back to the bed. Rolling in the bed with knees bent with mod A for LB clothing to be pull up. Pt left in bed resting.   Provided pt with [employer] bookbag with information on joining, peer support, sex/ sexuality, healthy foods/ lifestyle, exercise, pressure relief and resources and community support.   Therapy Documentation Precautions:  Precautions Precautions: Cervical, Fall Precaution/Restrictions Comments: MAP >65 Splint/Cast - Date Prophylactic Dressing Applied (if applicable):  (on) Restrictions Weight Bearing Restrictions Per Provider Order: Yes LUE Weight Bearing Per Provider Order: Touch down weight bearing Other Position/Activity Restrictions: Forearm based thumb spica LUE (per ortho); WHO RUE, B PRAFOs  Pain:     Therapy/Group: Individual Therapy  Claudene Nest [facility] 05/10/2024, 1:10 PM

## 2024-05-10 NOTE — Progress Notes (Addendum)
 Patient ID: Tyrone Mckinney, male   DOB: 14-Jan-1981, 43 y.o.   MRN: [id number]  SW spoke with [name] to follow-up about any questions. She inquires about transportation to home and if ambulance transport. SW shared w/c [place] transportation. She confirms that the ramp is being installed this weekend.   SW spoke with [employer] and informed his application is active to schedule transportation.  Will send his application to appropriate staff since [place] city limits.   1053- SW schedueld transportation to home for 10:30am pick with [facility] [phone]).  1359- SW called [name] to inform on above.   Graeme Jude, MSW, LCSW Office: [phone] Cell: [phone] Fax: [phone]

## 2024-05-10 NOTE — Progress Notes (Signed)
 Occupational Therapy Session Note  Patient Details  Name: Tyrone Mckinney MRN: [id number] Date of Birth: Jul 24, 1981  Today's Date: 05/10/2024 OT Individual Time: 8569-8450 OT Individual Time Calculation (min): 79 min    Short Term Goals: Week 1:  OT Short Term Goal 1 (Week 1): Pt will roll laterally in bed with Max A x1 to assist with ADL routine. OT Short Term Goal 1 - Progress (Week 1): Progressing toward goal OT Short Term Goal 2 (Week 1): Pt will bathe face with Mod A using compensatory techniques/strategies as needed. OT Short Term Goal 2 - Progress (Week 1): Progressing toward goal OT Short Term Goal 3 (Week 1): Pt will self-feed with Mod A using compensatory techniques/strategies as needed. OT Short Term Goal 3 - Progress (Week 1): Progressing toward goal Week 2:  OT Short Term Goal 1 (Week 2): Pt will roll laterally in bed with Max A x1 to assist with ADL routine. OT Short Term Goal 1 - Progress (Week 2): Progressing toward goal OT Short Term Goal 2 (Week 2): Pt will bathe face with Mod A using compensatory techniques/strategies as needed. OT Short Term Goal 2 - Progress (Week 2): Progressing toward goal OT Short Term Goal 3 (Week 2): Pt will self-feed with Mod A using compensatory techniques/strategies as needed. OT Short Term Goal 3 - Progress (Week 2): Progressing toward goal Week 3:  OT Short Term Goal 1 (Week 3): Pt will roll laterally in bed with Max A x1 to assist with ADL routine. OT Short Term Goal 1 - Progress (Week 3): Met OT Short Term Goal 2 (Week 3): Pt will self-feed with Mod A using compensatory techniques/strategies as needed. OT Short Term Goal 2 - Progress (Week 3): Progressing toward goal OT Short Term Goal 3 (Week 3): Pt will bathe face with Mod A using compensatory techniques/strategies as needed. OT Short Term Goal 3 - Progress (Week 3): Progressing toward goal OT Short Term Goal 4 (Week 3): Pt will maintain sitting balance EOB with max A in preparation  for TB transfers OT Short Term Goal 4 - Progress (Week 3): Met Week 4:  OT Short Term Goal 1 (Week 4): STG=LTG 2/2 ELOS  Skilled Therapeutic Interventions/Progress Updates:    1:1 Pt received in the bed and reported he voided - assisted with clothing change. Pt needed to continue to void assisted with urinal with total A. Hoyer lift to power chair and able to self propel down to the dayroom.   W/c evaluation with [facility] with Selinda Bach with focus on proper seating, positioning and ability to control chair's motor features. Discussed and educated on use fo w/c in community and home setting to fit his self care and mobility needs. Pt will receive loaner chair on Monday and will d/c home with loan chair Tuesday via [employer] transport at 10:30 am. Arm rests and controls adapted for uneven arm rest and for more UE support.   Pt left sitting up in room in chair.   Therapy Documentation Precautions:  Precautions Precautions: Cervical, Fall Precaution/Restrictions Comments: MAP >65 Splint/Cast - Date Prophylactic Dressing Applied (if applicable):  (on) Restrictions Weight Bearing Restrictions Per Provider Order: Yes LUE Weight Bearing Per Provider Order: Touch down weight bearing Other Position/Activity Restrictions: Forearm based thumb spica LUE (per ortho); WHO RUE, B PRAFOs  Pain:  No reports   Therapy/Group: Individual Therapy  Claudene Nest [facility] 05/10/2024, 3:49 PM

## 2024-05-11 MED ORDER — SORBITOL 70 % SOLN
30.0000 mL | Freq: Once | Status: DC
  Filled 2024-05-11: qty 30

## 2024-05-11 NOTE — Progress Notes (Signed)
 Occupational Therapy Session Note  Patient Details  Name: Tyrone Mckinney MRN: [id number] Date of Birth: Dec 13, 1980  Today's Date: 05/11/2024 OT Individual Time: 9184-9084 OT Individual Time Calculation (min): 60 min    Short Term Goals: Week 4:  OT Short Term Goal 1 (Week 4): STG=LTG 2/2 ELOS  Skilled Therapeutic Interventions/Progress Updates:    Pt resting in bed upon arrival. Incontinent of bladder. Dependent for hygiene and clothng mgmt. Rolling R/L in bed with max A+1. OTA donned Ted hose prior to LB dressing. LB dressing at bed level with tot A. Pt reported he needed to use urinal. Dependent for positioning, hegiene, and clothing mgmt. UB dressing with tot A. Pt initiates threading RUE into shirt sleeve. Pt directs care independently. Oral care and washing face with tot A. Pt remained in bed with all needs within reach.   Therapy Documentation Precautions:  Precautions Precautions: Cervical, Fall Precaution/Restrictions Comments: MAP >65 Splint/Cast - Date Prophylactic Dressing Applied (if applicable):  (on) Restrictions Weight Bearing Restrictions Per Provider Order: Yes LUE Weight Bearing Per Provider Order: Touch down weight bearing Other Position/Activity Restrictions: Forearm based thumb spica LUE (per ortho); WHO RUE, B PRAFOs    Pain: Pt reports 8/10 Lt shoulder pain; repositioned with relief noted   Therapy/Group: Individual Therapy  [name] 05/11/2024, 10:04 AM

## 2024-05-11 NOTE — Progress Notes (Signed)
 Wound Plan  Wounds present: Wound 05/02/24 1030 Pressure Injury Heel Left Deep Tissue Pressure Injury - Purple or maroon localized area of discolored intact skin or blood-filled blister due to damage of underlying soft tissue from pressure and/or shear.   Interventions:  WOC consult Diet regular Room / THIn liquids patient eating 50-100 % (feeding supplement) PROSource Plus liquid 30 mL30 mL, Oral, 2 times daily between meals, ascorbic acid  (VITAMIN C ) tablet 1,000 mg1,000 mg, 1 tablet, Oral, Daily multivitamin with minerals tablet 1 tablet ,Oral, Daily (ENSURE PLUS HIGH PROTEIN) liquid 237 mL, Oral, 3 times daily with meals & bedtime zinc  sulfate (50mg  elemental zinc ) capsule 220 mg Oral, Daily Mattress Replacement Prevalon boot Offload heels at all times Laterality: Bilateral Turn patient every 2 hours while in bed or assist with pressure relief every 15 min while in chair Routine, Until discontinued  Braden Score: 11  Sensory: 2  Moisture: 3  Activity: 1  Mobility: 1  Nutrition: 3  Friction: 1  Contributors: Dr. Cornelio Daphne Finders NP Lela Holm RN, CNS, [facility], MSN.  Eulalio Falls BSN, RN-BC [employer]

## 2024-05-11 NOTE — Progress Notes (Addendum)
 IP Rehab Bowel Program Documentation   Bowel Program Start time 1842  Dig Stim Indicated? Yes  Dig Stim Prior to Suppository or mini Enema=per MD order   Output from dig stim: Small  Ordered intervention: Suppository Yes , mini enema No ,   Repeat dig stim after Suppository or Mini enema  X = Yes, per MD order  Output? Medium   Bowel Program Complete? Yes , handoff given Rosina, RN  Patient Tolerated? Yes

## 2024-05-11 NOTE — Plan of Care (Signed)
  Problem: Consults Goal: RH SPINAL CORD INJURY PATIENT EDUCATION Description:  See Patient Education module for education specifics.  Outcome: Progressing   Problem: SCI BOWEL ELIMINATION Goal: RH STG MANAGE BOWEL WITH ASSISTANCE Description: STG Manage Bowel with max Assistance. Outcome: Progressing   Problem: SCI BLADDER ELIMINATION Goal: RH STG MANAGE BLADDER WITH ASSISTANCE Description: STG Manage Bladder With max Assistance Outcome: Progressing   Problem: RH SKIN INTEGRITY Goal: RH STG SKIN FREE OF INFECTION/BREAKDOWN Description: Manage skin free of infection with max assistance Outcome: Progressing   Problem: RH SAFETY Goal: RH STG ADHERE TO SAFETY PRECAUTIONS W/ASSISTANCE/DEVICE Description: STG Adhere to Safety Precautions With max  Assistance/Device. Outcome: Progressing   Problem: RH KNOWLEDGE DEFICIT SCI Goal: RH STG INCREASE KNOWLEDGE OF SELF CARE AFTER SCI Description: Manage increase knowledge of self care after SCI with max assistance from spouse/ family using educational materials  provided.  Outcome: Progressing

## 2024-05-11 NOTE — Progress Notes (Signed)
 PROGRESS NOTE   Subjective/Complaints:  Pt doing well, slept well, pain well managed, LBM last night, urinating fine with purewick. No other complaints or concerns.   ROS: as per HPI.    Pt denies SOB, abd pain, CP, N/V/C/D, and vision changes    Objective:   No results found.    Recent Labs    05/09/24 0506  WBC 4.9  HGB 10.4*  HCT 31.8*  PLT 286      Recent Labs    05/09/24 0506  NA 138  K 4.1  CL 101  CO2 29  GLUCOSE 101*  BUN 19  CREATININE 0.83  CALCIUM  9.1           Intake/Output Summary (Last 24 hours) at 05/11/2024 1048 Last data filed at 05/10/2024 1920 Gross per 24 hour  Intake 472 ml  Output 200 ml  Net 272 ml         Physical Exam: Vital Signs Blood pressure 97/71, pulse 65, temperature 98.2 F (36.8 C), temperature source Oral, resp. rate 16, height 6' 4 (1.93 m), weight 88.9 kg, SpO2 99%.    General: awake, alert, appropriate, laying in bed but OT getting him dressed; NAD HENT: conjugate gaze; oropharynx moist CV: regular rate and rhythm; no JVD Pulmonary: CTA B/L; no W/R/R- good air movement GI: soft, NT, ND, (+)BS Psychiatric: appropriate- bright affect Neuro: A&O x4, memory intact, insight and awareness intact.   PRIOR EXAMS: MSK- mild swelling of L knee- no pain or TTP over actual knee or IT band- but elicited pain with abduction and rotation of L hip. Ext: no clubbing, cyanosis, or edema except mild swelling of L knee- maybe mild effusion   MSK: generalized tenderness to palpation in shoulders/upper arms with bed mobility and AROM/PROM Skin: minimal drainage from lower aspect of incision, sacral wound dressed.  Neurological:     Mental Status: He is alert.     Comments: Alert and oriented x 3. Normal insight and awareness. Intact Memory. Normal language and speech. Cranial nerve exam unremarkable. MMT: RUE: delt 2+/5, biceps 3- to 3/5, triceps maybe trace,  wrist and HI all 0/5.  LUE: delt 1/5, biceps 2/5, triceps, wrist, HI all 0/5. RLE: 1/5 HE and ~1+/5 KE, 0/5 everywhere else. LLE: 2 to 2+/5 HF, HAD and KE, ADF/PF 2+/5. Sensory exam diminished below C5 on right and ~C6 on left. 1/2 sensation in trunk and both LE, as well as inguinal/rectal area. Sensed sl more in LLE. DTR's 1+ in BUE and 3+ in BLE. Pt without resting BLE tone.  Perhaps a beat or two of clonus in either foot.    Assessment/Plan: 1. Functional deficits which require 3+ hours per day of interdisciplinary therapy in a comprehensive inpatient rehab setting. Physiatrist is providing close team supervision and 24 hour management of active medical problems listed below. Physiatrist and rehab team continue to assess barriers to discharge/monitor patient progress toward functional and medical goals  Care Tool:  Bathing    Body parts bathed by patient: Chest   Body parts bathed by helper: Buttocks, Right upper leg, Left upper leg, Right lower leg, Left lower leg, Face, Right arm, Left arm, Abdomen, Front  perineal area     Bathing assist Assist Level: Total Assistance - Patient < 25%     Upper Body Dressing/Undressing Upper body dressing   What is the patient wearing?: Pull over shirt    Upper body assist Assist Level: Total Assistance - Patient < 25%    Lower Body Dressing/Undressing Lower body dressing      What is the patient wearing?: Incontinence brief, Pants     Lower body assist Assist for lower body dressing: 2 Helpers     Toileting Toileting    Toileting assist Assist for toileting: Dependent - Patient 0%     Transfers Chair/bed transfer  Transfers assist     Chair/bed transfer assist level: Dependent - mechanical lift     Locomotion Ambulation   Ambulation assist   Ambulation activity did not occur: Safety/medical concerns          Walk 10 feet activity   Assist  Walk 10 feet activity did not occur: Safety/medical concerns         Walk 50 feet activity   Assist Walk 50 feet with 2 turns activity did not occur: Safety/medical concerns         Walk 150 feet activity   Assist Walk 150 feet activity did not occur: Safety/medical concerns         Walk 10 feet on uneven surface  activity   Assist Walk 10 feet on uneven surfaces activity did not occur: Safety/medical concerns         Wheelchair     Assist Is the patient using a wheelchair?: Yes Type of Wheelchair: Power    Wheelchair assist level: Contact Guard/Touching assist, Minimal Assistance - Patient > 75% Max wheelchair distance: 200+    Wheelchair 50 feet with 2 turns activity    Assist        Assist Level: Minimal Assistance - Patient > 75%   Wheelchair 150 feet activity     Assist      Assist Level: Minimal Assistance - Patient > 75%   Blood pressure 97/71, pulse 65, temperature 98.2 F (36.8 C), temperature source Oral, resp. rate 16, height 6' 4 (1.93 m), weight 88.9 kg, SpO2 99%.  Medical Problem List and Plan: 1. 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 Darnella of [facility] on 03/28/24:             -patient may not yet shower             -ELOS/Goals: 05/14/24.  mod to max assist goals             -No collar needed per Dr. Darnella             -Bilateral PRAFO's, R WHO.  -Will switch PRAFOs to Prevalons due to spasticity D/c date 9/30, power w/c education Educated that he's supposed to have BM with bowel program, not afterwards- also educated that if don't do at home, which was planned, will go into AD- which can be very dangerous- he voiced understanding Con't CIR PT and OT 2.  Antithrombotics: -DVT/anticoagulation:  Pharmaceutical: Lovenox  30mg   BID 9/1- is (-) for DVT 9/4- needs Elqiuis or Lovenox  for a total of 3 months ideally-  9/26- will go home on 1 month of Lovenox   or Eliquis- hopefully Medicaid will kick in by then, and can get another 2- 4 weeks of meds             -antiplatelet therapy: ASA 81mg  daily 3. Pain Management: Cymbalta  30mg  nightly, Gabapentin  600mg  TID, Tylenol  1000mg  q6h, Robaxin  1000mg  q8h, oxycodone  10-15mg  q4h prn,  -Consider increasing Cymbalta  to 60mg  nightly for improved pain control if needed -Pt developing early hypertonicity in LE's. Will begin scheduling tizanidine  at 2mg  tid to start. Can have 2mg  tid prn as well -04/13/24 decrease tylenol  650mg  QID d/t transminitis as below 9/1- changed Cymbalta  to qday since had nightmares last night- didn't take trazodone . 9/3- still having nightmares, but not med related- might be PTSD? Vs subconscious dealing with his injury? 9/15- pain is improved. Pt satsified 9/18- will increase Cymbalta  to 60 mg daily- to help mood which I'm concerned about as well as pain 9/19 patient reported upper body soreness today. Told me he was satisfied with pain mgt currently 9/23- added voltaren  gel for L knee 9/25- 9/27- pain mgmt working adequately- con't regimen 4. Mood/Behavior/Sleep: ego support             -antipsychotic agents: n/a             -Psych consult inpatient, no needs at that time -Pt will need emotional support. Seems to be pretty upbeat. Family sounds supportive. Will have Dr. Corina see pt at some point during admit  9/4- D/w pt and family today that he's very 'bright- but I'm expecting him to hit a wall when all this really hits.  9/19 cymbalta  increased to 60mg  as above 5. Neuropsych/cognition: This patient is capable of making decisions on his own behalf. 6. Skin/Wound Care/large forehead laceration: staples removed 04/04/24.  Routine skin care.  9/8- incision on posterior neck puffy, draining, called Dr Darnella via NP 7. Fluids/Electrolytes/Nutrition:  routine I&O, routine labs, continue vitamins/supplements (Ensure plus high protein BID, MVI)             -Regular diet 8. 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 Lester of [facility], continue ASA 81mg  daily 9. Superior endplate compression fx T5, T7, T8, T9, B/L upper thoracic posterior paraspinal muscle injury and edema, interspinous ligament injury T2-3 and possibly T3-4: no intervention needed. Per Dr. Darnella.  10. B/L apical pulmonary contusions, small pulmonary lac RLL: aggressive pulmonary toilet 11. L Ulnar styloid fx: NWB LUE, splint, f/up Dr. Murrell 9/4- when gets closer to 6 weeks, will call Dr Marjorie to go over when can WBAT 9/9- cannot see a surgery done? So 6 weeks from 8/14- called Dr Marjorie- ordered a wrist xray to f/u- and unlikely to be able to [facility] prior to d/c  -05/04/24 it's been just over 6wks now, consider talking to Dr. Murrell this week 9/23- will call today-L/M for Dr Murrell to call me back and see what the next step is- since it's been 6 weeks 9/25- called and got xrays- pending Dr Murrell 9/26- will have NP call and double check plan 12. Orthostatic hypotension: continue Midodrine  20mg  TID, Strattera  25mg  daily also started per Dr. Jyl recs with goal perhaps of coming off midodrine . -continue TEDs and abdominal binder -04/14/24 BPs improving, monitor 9/1-9/2 in general BP's soft, in 100s-110s. Supine, but doing better- con't regimen 9/3-9/4 BP's running low 90's to 110's- no concern about OH currently per therapy -9/6-7/25 BP occasionally elevated briefly but improves, no AD sxs, monitor 9/8- BP 131 systolic this AM! Sitting upright 9/15--bp  remains very soft. Continue acclimation with PT 9/16- 9/17- BP still very soft, but doing better in last 24 hours- low 100's systolic- -con't regimen 9/18-19- BP's soft but stable  -continue midodrine  and strattera  -9/25-9/27  BPs improving, usually has spike in afternoon, monitor Vitals:   05/07/24 1929 05/08/24 0606  05/08/24 0743 05/08/24 1300  BP: 103/76 (!) 103/59 113/72 (!) 140/88   05/08/24 1957 05/09/24 0444 05/09/24 1425 05/09/24 1943  BP: 110/78 107/68 121/75 105/77   05/10/24 0309 05/10/24 1650 05/10/24 1937 05/11/24 0454  BP: 93/63 133/88 128/85 97/71    13. Neurogenic bowel: on  bowel program: Dulcolax suppository 10mg  daily, Miralax  BID, SenokotS 1 tab BID             -will adjust bowel program to 1800 daily             -senokot-s 2 tabs q am and miralax  BID  9/5- no BM last night and none with another bowel program this AM in spite of being done-- will give Sorbitol  60cc now and then SSE this evening with bowel program- ordered -04/21/24 small BM last night-- monitor 9/9- Will give 60cc Sorbitol  at 2pm and SE at 6pm at part of bowel program- cannot add Linzess since isn't insured- might need at home to take Mg citrate every few days- also taking a lot of Oxycodone  as well - hopefully this will decrease over time.  9/11 having response to bowel program but stool too soft  -hold miralax  bid and 3 senna-s's daily for now  -resume them in some form tomorrow 9/12 formed stool last night, resume daily miralax  tomorrow and 2 senna-s tabs today -9/15 had a large T6 bm at 0100 this am 9/16- Had large BM overnight with bowel program 9/17- had large BM at 11:45 pm- 9/18-19 no bm or bowel program documented---I am follow up with nursing. Needs to be on program -9/20-21/25 BM overnight, getting bowel program. Monitor. 9/23- Small BM and minimal results from day bowel program - if no good BM by tomorrow, will do sorbitol   9/26- Small BM overnight- if no results or small results will need Sorbitol  on Saturday -05/11/24 small BM overnight-- per weekday team, will do sorbitol  now.  14. Neurogenic bladder:              -adjust I/O cath schedule to volumes of 300-500 cc -04/13/24 apparently used condom cath last night-- does feel urge to urinate, no bladder scans documented but pt says they were done but no ISC  needed -Reviewed with nursing the plan for him, no condom cath for now, attempt with urinal q4-6h and bladder scan -ISC if scan >250cc 9/4- still not voiding with toileting 9/5- I still think he's kicking off, but not emptying- he has no feeling of voiding, just leaking-- same 9/6-7, no caths still, but incontinent 9/12-15 pt has been incontinent with low to medium PVR's. He insists that he can feel when he needs to empty and that staff can't get to him in time. He has mentioned not wanting to bother nursing so much.  -continue timed voids q3-4 hours and obsv  -encouraged pt to notify staff when he needs to empty -continue voiding trial/education 9/16- Pt says staff cannot get to him, but mentions the time is very short- like 1-2 minutes at times- of note- con't regimen 9/17-9/19 still kicking off, regularly incontinent. Consider antispasmodic and I/O caths vs condom cath/bag.  -05/11/24 pt is able to tell when he needs to urinate  just doesn't have very much time before incontinent-- OT witnessed this today, was able to use urinal just needed it handed to him quickly.  15. Polysubstance use disorder: UDS +cocaine, THC, Benzos-- provide counseling 16. 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 F/up outpatient.  17. ABLA: hgb stable 8-9 range, monitor twice weekly  -04/13/24 Hgb 9.6, monitor>> stable 9/4 at 10 18. Transaminitis: -04/13/24 Alk phos 286, AST 58, ALT 153-- could be from shock liver, monitor again on Monday. Decrease tylenol  to 650mg  QID. No abd pain/n/v but consider imaging/work up if those develop.  9/1-  Down to 104 and 119 from 64 and 153- will reduction in Tylenol - -if not better Thursday, will reduce Tylenol  further 9/4- will stop scheduled tylenol  and make 325 mg q6 hours prn if need be 9/8- AST/ALT about the same-  48/110- and alk phos 213 (alk phos is down substantially)- if not down by Thursday, will reassess 9/9- cannot start Dantrolene with these issues 9/16- AST 42 up from 29 and ALT 95 up from 74- will recheck Thursday- because not taking tylenol - could be baclofen , but I don't know what else can can do for spasticity? 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 - pt had BP yesterday of 166/92- pt admits had HA- so had AD- educated pt and family on AD- will continue to educate staff, because we were not called on the probable AD episode yesterday- 166/92 was BP- isolated spike in BP = AD esp in pt that has OH- went over with PA and staff about how to treat- sit pt up, loosen tight clothing, then if doesn't improve, cath him; do bowel program- that's 80% of all AD causes- if not improved, let PA/Doctor know 9/5- Had BP of 177/92 this AM- likely AD, although no HA- nurse did great catch and tried to do bowel program- no results- will order another round of Sorbitol  60cc with SSE this evening to get him cleaned out- esp with constipation -04/20/24 occasionally elevated BP but improves quickly, no AD symptoms, monitor 9/9- had AD yesterday AM- no HA or nasal congestions but BP -9/12-15 no further AD. Having daily results with bowel program.    20. Spasticity-  9/5- will increase Baclofen  to 10 mg TID_ and first dose at 630am- d/w nursing and therapy that spasticity is progressing- once it starts, it's pretty fast initially- usually starts around 3-4 weeks and progresses, as I educated family about- and unfortunately we will try to keep up, but it's hard- he also has Zanaflex  2 mg TID AND 2 mg TID prn for additional spasms 9/8- has improved spasticity-  9/9- spasms much worse this AM- like didn't get  meds, but per chart, received Baclofen  at 546 this AM- will increase Zanaflex  to 4 mg TID- it's progressing so fast- not sure due to inflammation from mild incision infection- on keflex - can set it off 9/12-15 continue above schedule. Spasticity is MUCH improved compared to admit 9/16-9/19- spasticity much improved 9/26- spasticity controlled per pt- con't regimen 21. 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 If so, will order SLP eval.  22. Wound abscess- in superficial tissues, not actual deep wound infection per Dr Darnella 9/9-11- NSU saw and put on keflex  for 7 days- will con't to monitor- is still draining- - per pt, Dr Darnella mentioned that if things weren't better at day 7 (9/15 at latest) that to call him back.  9/12-15 wound/drainage appear to be improving--continue with above plan 9/23- assessed wound yesterday- looks open, however didn't appear to have drainage- will reassess.  23. L knee pain  9/23- will add Voltaren  gel 4x/day for L knee  9/26- hasn't been complaining of knee pain since started     LOS: 29 days A FACE TO FACE EVALUATION WAS PERFORMED  [address] 05/11/2024, 10:48 AM

## 2024-05-11 NOTE — Progress Notes (Signed)
 Physical Therapy Session Note  Patient Details  Name: Tyrone Mckinney MRN: [id number] Date of Birth: [birth year]  Today's Date: 05/11/2024 PT Individual Time: 0950-1027 PT Individual Time Calculation (min): 37 min   Short Term Goals: Week 4:  PT Short Term Goal 1 (Week 4): STG = LTG due to ELOS  Skilled Therapeutic Interventions/Progress Updates: Patient semi-reclined in bed on entrance to room. Patient alert and agreeable to PT session.   Patient reported unrated pain in B hands around thumb with nsg reporting medication being provided prior to PTA arrival. Pt performed SAQ with 4 rolled up towels under L knee 2 x 10 with cues to control eccentric (PTA provided min resistance on 2nd round). Pt then performed available ROM SAQ on R LE with one pillow rolled up (unable to achieve full extension vs L) x 10. On 2nd round, PTA passively moved LE into full knee extension with cues for pt to control eccentric (minA at first to control, then mod/maxA to control on last few reps with spasticity kicking in). Pt set up in chair position in bed and performed static upright sitting balance (B feet supported on platform) with maxA + 2 and pt cued to flex/extend R UE to available ROM with PTA providing light manual resistance against flexion/extension. Pt then cued to control eccentric (maxA) back to semi-reclined position with multimodal cuing to engage abdominal flexors multiple reps, then progressed to cues of flexing forward with noted engagement (still maxA). Pt required VC to exhale breath when flexing forward to avoid valsalva maneuver. Nsg arrived with pt's late breakfast (time given at end of session for pt to eat while it was warm).   Patient semi-reclined in bed at end of session with brakes locked, nsg present, and all needs within reach.      Therapy Documentation Precautions:  Precautions Precautions: Cervical, Fall Precaution/Restrictions Comments: MAP >65 Splint/Cast - Date Prophylactic  Dressing Applied (if applicable):  (on) Restrictions Weight Bearing Restrictions Per Provider Order: Yes LUE Weight Bearing Per Provider Order: Touch down weight bearing Other Position/Activity Restrictions: Forearm based thumb spica LUE (per ortho); WHO RUE, B PRAFOs   Therapy/Group: Individual Therapy  Ikechukwu Cerny PTA 05/11/2024, 12:38 PM

## 2024-05-11 NOTE — Plan of Care (Signed)
  Problem: Consults Goal: RH SPINAL CORD INJURY PATIENT EDUCATION Description:  See Patient Education module for education specifics.  Outcome: Progressing   Problem: SCI BOWEL ELIMINATION Goal: RH STG MANAGE BOWEL WITH ASSISTANCE Description: STG Manage Bowel with max Assistance. Outcome: Progressing   Problem: SCI BLADDER ELIMINATION Goal: RH STG MANAGE BLADDER WITH ASSISTANCE Description: STG Manage Bladder With max Assistance Outcome: Progressing   Problem: RH SKIN INTEGRITY Goal: RH STG SKIN FREE OF INFECTION/BREAKDOWN Description: Manage skin free of infection with max assistance Outcome: Progressing   Problem: RH SAFETY Goal: RH STG ADHERE TO SAFETY PRECAUTIONS W/ASSISTANCE/DEVICE Description: STG Adhere to Safety Precautions With max  Assistance/Device. Outcome: Progressing   Problem: RH PAIN MANAGEMENT Goal: RH STG PAIN MANAGED AT OR BELOW PT'S PAIN GOAL Description: <4 w/ prns Outcome: Progressing   Problem: RH KNOWLEDGE DEFICIT SCI Goal: RH STG INCREASE KNOWLEDGE OF SELF CARE AFTER SCI Description: Manage increase knowledge of self care after SCI with max assistance from spouse/ family using educational materials  provided.  Outcome: Progressing

## 2024-05-12 NOTE — Progress Notes (Signed)
   IP Rehab Bowel Program Documentation    Bowel Program Start time [phone]   Dig Stim Indicated? Yes  Dig Stim Prior to Suppository or mini Enema=per MD order    Output from dig stim: Small   Ordered intervention: Suppository Yes , mini enema No ,    Repeat dig stim after Suppository or Mini enema  X = Yes, per MD order   Output? None at this time   Bowel Program Complete? Yes    Patient Tolerated? Yes

## 2024-05-12 NOTE — Progress Notes (Signed)
 PROGRESS NOTE   Subjective/Complaints:  Pt doing well again today, slept well, pain well managed, LBM yesterday x2 (refused sorbitol  but had good BMs without it), urinating fine with purewick. No other complaints or concerns.   ROS: as per HPI.    Pt denies SOB, abd pain, CP, N/V/C/D, and vision changes    Objective:   No results found.    No results for input(s): WBC, HGB, HCT, PLT in the last 72 hours.     No results for input(s): NA, K, CL, CO2, GLUCOSE, BUN, CREATININE, CALCIUM  in the last 72 hours.          Intake/Output Summary (Last 24 hours) at 05/12/2024 1011 Last data filed at 05/11/2024 1300 Gross per 24 hour  Intake 577 ml  Output --  Net 577 ml         Physical Exam: Vital Signs Blood pressure 94/69, pulse 74, temperature 97.7 F (36.5 C), temperature source Oral, resp. rate 16, height 6' 4 (1.93 m), weight 88.9 kg, SpO2 97%.    General: awake, alert, appropriate, laying in bed watching TV; NAD HENT: conjugate gaze; oropharynx moist CV: regular rate and rhythm; no JVD Pulmonary: CTA B/L; no W/R/R- good air movement GI: soft, NT, ND, (+)BS Psychiatric: appropriate- bright affect Neuro: A&O x4, memory intact, insight and awareness intact.   PRIOR EXAMS: MSK- mild swelling of L knee- no pain or TTP over actual knee or IT band- but elicited pain with abduction and rotation of L hip. Ext: no clubbing, cyanosis, or edema except mild swelling of L knee- maybe mild effusion   MSK: generalized tenderness to palpation in shoulders/upper arms with bed mobility and AROM/PROM Skin: minimal drainage from lower aspect of incision, sacral wound dressed.  Neurological:     Mental Status: He is alert.     Comments: Alert and oriented x 3. Normal insight and awareness. Intact Memory. Normal language and speech. Cranial nerve exam unremarkable. MMT: RUE: delt 2+/5, biceps 3-  to 3/5, triceps maybe trace, wrist and HI all 0/5.  LUE: delt 1/5, biceps 2/5, triceps, wrist, HI all 0/5. RLE: 1/5 HE and ~1+/5 KE, 0/5 everywhere else. LLE: 2 to 2+/5 HF, HAD and KE, ADF/PF 2+/5. Sensory exam diminished below C5 on right and ~C6 on left. 1/2 sensation in trunk and both LE, as well as inguinal/rectal area. Sensed sl more in LLE. DTR's 1+ in BUE and 3+ in BLE. Pt without resting BLE tone.  Perhaps a beat or two of clonus in either foot.    Assessment/Plan: 1. Functional deficits which require 3+ hours per day of interdisciplinary therapy in a comprehensive inpatient rehab setting. Physiatrist is providing close team supervision and 24 hour management of active medical problems listed below. Physiatrist and rehab team continue to assess barriers to discharge/monitor patient progress toward functional and medical goals  Care Tool:  Bathing    Body parts bathed by patient: Chest   Body parts bathed by helper: Buttocks, Right upper leg, Left upper leg, Right lower leg, Left lower leg, Face, Right arm, Left arm, Abdomen, Front perineal area     Bathing assist Assist Level: Total Assistance - Patient < 25%  Upper Body Dressing/Undressing Upper body dressing   What is the patient wearing?: Pull over shirt    Upper body assist Assist Level: Total Assistance - Patient < 25%    Lower Body Dressing/Undressing Lower body dressing      What is the patient wearing?: Incontinence brief, Pants     Lower body assist Assist for lower body dressing: 2 Helpers     Toileting Toileting    Toileting assist Assist for toileting: Dependent - Patient 0%     Transfers Chair/bed transfer  Transfers assist     Chair/bed transfer assist level: Dependent - mechanical lift     Locomotion Ambulation   Ambulation assist   Ambulation activity did not occur: Safety/medical concerns          Walk 10 feet activity   Assist  Walk 10 feet activity did not occur:  Safety/medical concerns        Walk 50 feet activity   Assist Walk 50 feet with 2 turns activity did not occur: Safety/medical concerns         Walk 150 feet activity   Assist Walk 150 feet activity did not occur: Safety/medical concerns         Walk 10 feet on uneven surface  activity   Assist Walk 10 feet on uneven surfaces activity did not occur: Safety/medical concerns         Wheelchair     Assist Is the patient using a wheelchair?: Yes Type of Wheelchair: Power    Wheelchair assist level: Contact Guard/Touching assist, Minimal Assistance - Patient > 75% Max wheelchair distance: 200+    Wheelchair 50 feet with 2 turns activity    Assist        Assist Level: Minimal Assistance - Patient > 75%   Wheelchair 150 feet activity     Assist      Assist Level: Minimal Assistance - Patient > 75%   Blood pressure 94/69, pulse 74, temperature 97.7 F (36.5 C), temperature source Oral, resp. rate 16, height 6' 4 (1.93 m), weight 88.9 kg, SpO2 97%.  Medical Problem List and Plan: 1. 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 Darnella of [facility] on 03/28/24:             -patient may not yet shower             -ELOS/Goals: 05/14/24.  mod to max assist goals             -No collar needed per Dr. Darnella             -Bilateral PRAFO's, R WHO.  -Will switch PRAFOs to Prevalons due to spasticity D/c date 9/30, power w/c education Educated that he's supposed to have BM with bowel program, not afterwards- also educated that if don't do at home, which was planned, will go into AD- which can be very dangerous- he voiced understanding Con't CIR PT and OT 2.  Antithrombotics: -DVT/anticoagulation:   Pharmaceutical: Lovenox  30mg  BID 9/1- is (-) for DVT 9/4- needs Elqiuis or Lovenox  for a total of 3 months ideally-  9/26- will go home on 1 month of Lovenox  or Eliquis- hopefully Medicaid will kick in by then, and can get another 2- 4 weeks of meds             -  antiplatelet therapy: ASA 81mg  daily 3. Pain Management: Cymbalta  30mg  nightly, Gabapentin  600mg  TID, Tylenol  1000mg  q6h, Robaxin  1000mg  q8h, oxycodone  10-15mg  q4h prn,  -Consider increasing Cymbalta  to 60mg  nightly for improved pain control if needed -Pt developing early hypertonicity in LE's. Will begin scheduling tizanidine  at 2mg  tid to start. Can have 2mg  tid prn as well -04/13/24 decrease tylenol  650mg  QID d/t transminitis as below 9/1- changed Cymbalta  to qday since had nightmares last night- didn't take trazodone . 9/3- still having nightmares, but not med related- might be PTSD? Vs subconscious dealing with his injury? 9/15- pain is improved. Pt satsified 9/18- will increase Cymbalta  to 60 mg daily- to help mood which I'm concerned about as well as pain 9/19 patient reported upper body soreness today. Told me he was satisfied with pain mgt currently 9/23- added voltaren  gel for L knee 9/25- 9/27- pain mgmt working adequately- con't regimen 4. Mood/Behavior/Sleep: ego support             -antipsychotic agents: n/a             -Psych consult inpatient, no needs at that time -Pt will need emotional support. Seems to be pretty upbeat. Family sounds supportive. Will have Dr. Corina see pt at some point during admit  9/4- D/w pt and family today that he's very 'bright- but I'm expecting him to hit a wall when all this really hits.  9/19 cymbalta  increased to 60mg  as above 5. Neuropsych/cognition: This patient is capable of making decisions on his own behalf. 6. Skin/Wound Care/large forehead laceration: staples removed 04/04/24.  Routine skin care.  9/8- incision on posterior neck puffy, draining, called Dr Darnella via NP 7.  Fluids/Electrolytes/Nutrition: routine I&O, routine labs, continue vitamins/supplements (Ensure plus high protein BID, MVI)             -Regular diet 8. 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 Lester of [facility], continue ASA 81mg  daily 9. Superior endplate compression fx T5, T7, T8, T9, B/L upper thoracic posterior paraspinal muscle injury and edema, interspinous ligament injury T2-3 and possibly T3-4: no intervention needed. Per Dr. Darnella.  10. B/L apical pulmonary contusions, small pulmonary lac RLL: aggressive pulmonary toilet 11. L Ulnar styloid fx: NWB LUE, splint, f/up Dr. Murrell 9/4- when gets closer to 6 weeks, will call Dr Marjorie to go over when can WBAT 9/9- cannot see a surgery done? So 6 weeks from 8/14- called Dr Marjorie- ordered a wrist xray to f/u- and unlikely to be able to [facility] prior to d/c  -05/04/24 it's been just over 6wks now, consider talking to Dr. Murrell this week 9/23- will call today-L/M for Dr Murrell to call me back and see what the next step is- since it's been 6 weeks 9/25- called and got xrays- pending Dr Murrell 9/26- will have NP call and double check plan 12. Orthostatic hypotension: continue Midodrine  20mg  TID, Strattera  25mg  daily also started per Dr. Jyl recs with goal perhaps of coming off midodrine . -continue TEDs and abdominal binder -04/14/24 BPs improving, monitor 9/1-9/2 in general BP's soft, in 100s-110s. Supine, but doing better- con't regimen 9/3-9/4 BP's running low 90's to 110's- no concern about OH currently per therapy -9/6-7/25 BP occasionally elevated briefly but improves, no AD sxs, monitor 9/8- BP 131 systolic this AM! Sitting upright 9/15--bp remains very soft. Continue acclimation with PT 9/16- 9/17- BP still very soft, but doing better in last 24 hours- low 100's systolic- -con't regimen 9/18-19- BP's soft but stable  -  continue midodrine  and strattera  -9/25-9/28  BPs improving, usually has spike in afternoon, monitor Vitals:    05/08/24 1300 05/08/24 1957 05/09/24 0444 05/09/24 1425  BP: (!) 140/88 110/78 107/68 121/75   05/09/24 1943 05/10/24 0309 05/10/24 1650 05/10/24 1937  BP: 105/77 93/63 133/88 128/85   05/11/24 0454 05/11/24 1300 05/11/24 1934 05/12/24 0249  BP: 97/71 115/77 136/83 94/69    13. Neurogenic bowel: on  bowel program: Dulcolax suppository 10mg  daily, Miralax  BID, SenokotS 1 tab BID             -will adjust bowel program to 1800 daily             -senokot-s 2 tabs q am and miralax  BID  9/5- no BM last night and none with another bowel program this AM in spite of being done-- will give Sorbitol  60cc now and then SSE this evening with bowel program- ordered -04/21/24 small BM last night-- monitor 9/9- Will give 60cc Sorbitol  at 2pm and SE at 6pm at part of bowel program- cannot add Linzess since isn't insured- might need at home to take Mg citrate every few days- also taking a lot of Oxycodone  as well - hopefully this will decrease over time.  9/11 having response to bowel program but stool too soft  -hold miralax  bid and 3 senna-s's daily for now  -resume them in some form tomorrow 9/12 formed stool last night, resume daily miralax  tomorrow and 2 senna-s tabs today -9/15 had a large T6 bm at 0100 this am 9/16- Had large BM overnight with bowel program 9/17- had large BM at 11:45 pm- 9/18-19 no bm or bowel program documented---I am follow up with nursing. Needs to be on program -9/20-21/25 BM overnight, getting bowel program. Monitor. 9/23- Small BM and minimal results from day bowel program - if no good BM by tomorrow, will do sorbitol   9/26- Small BM overnight- if no results or small results will need Sorbitol  on Saturday -05/11/24 small BM overnight-- per weekday team, will do sorbitol  now. -05/12/24 refused sorbitol  yesterday but had good BMs yesterday anyway. Cont regimen.   14. Neurogenic bladder:              -adjust I/O cath schedule to volumes of 300-500 cc -04/13/24 apparently used condom  cath last night-- does feel urge to urinate, no bladder scans documented but pt says they were done but no ISC needed -Reviewed with nursing the plan for him, no condom cath for now, attempt with urinal q4-6h and bladder scan -ISC if scan >250cc 9/4- still not voiding with toileting 9/5- I still think he's kicking off, but not emptying- he has no feeling of voiding, just leaking-- same 9/6-7, no caths still, but incontinent 9/12-15 pt has been incontinent with low to medium PVR's. He insists that he can feel when he needs to empty and that staff can't get to him in time. He has mentioned not wanting to bother nursing so much.  -continue timed voids q3-4 hours and obsv  -encouraged pt to notify staff when he needs to empty -continue voiding trial/education 9/16- Pt says staff cannot get to him, but mentions the time is very short- like 1-2 minutes at times- of note- con't regimen 9/17-9/19 still kicking off, regularly incontinent. Consider antispasmodic and I/O caths vs condom cath/bag.  -05/11/24 pt is able to tell when he needs to urinate just doesn't have very much time before incontinent-- OT witnessed this today, was able to use urinal just needed  it handed to him quickly.  15. Polysubstance use disorder: UDS +cocaine, THC, Benzos-- provide counseling 16. 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 F/up outpatient.  17. ABLA: hgb stable 8-9 range, monitor twice weekly  -04/13/24 Hgb 9.6, monitor>> stable 9/4 at 10 18. Transaminitis: -04/13/24 Alk phos 286, AST 58, ALT 153-- could be from shock liver, monitor again on Monday. Decrease tylenol  to 650mg  QID. No abd pain/n/v but consider imaging/work up if those develop.  9/1-  Down to 15 and 119 from 6 and 153- will reduction in Tylenol - -if not better Thursday, will reduce Tylenol   further 9/4- will stop scheduled tylenol  and make 325 mg q6 hours prn if need be 9/8- AST/ALT about the same- 48/110- and alk phos 213 (alk phos is down substantially)- if not down by Thursday, will reassess 9/9- cannot start Dantrolene with these issues 9/16- AST 42 up from 29 and ALT 95 up from 74- will recheck Thursday- because not taking tylenol - could be baclofen , but I don't know what else can can do for spasticity? 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 - pt had BP yesterday of 166/92- pt admits had HA- so had AD- educated pt and family on AD- will continue to educate staff, because we were not called on the probable AD episode yesterday- 166/92 was BP- isolated spike in BP = AD esp in pt that has OH- went over with PA and staff about how to treat- sit pt up, loosen tight clothing, then if doesn't improve, cath him; do bowel program- that's 80% of all AD causes- if not improved, let PA/Doctor know 9/5- Had BP of 177/92 this AM- likely AD, although no HA- nurse did great catch and tried to do bowel program- no results- will order another round of Sorbitol  60cc with SSE this evening to get him cleaned out- esp with constipation -04/20/24 occasionally elevated BP but improves quickly, no AD symptoms, monitor 9/9- had AD yesterday AM- no HA or nasal congestions but BP -9/12-15 no further AD. Having daily results with bowel program.    20. Spasticity-  9/5- will increase Baclofen  to 10 mg TID_ and first dose at 630am- d/w nursing and therapy that spasticity is progressing- once it starts, it's pretty fast initially- usually starts around 3-4 weeks and progresses, as I educated family about- and unfortunately we will try to keep up, but it's hard- he also has Zanaflex  2 mg TID AND 2 mg  TID prn for additional spasms 9/8- has improved spasticity-  9/9- spasms much worse this AM- like didn't get meds, but per chart, received Baclofen  at 546 this AM- will increase Zanaflex  to 4 mg TID- it's progressing so fast- not sure due to inflammation from mild incision infection- on keflex - can set it off 9/12-15 continue above schedule. Spasticity is MUCH improved compared to admit 9/16-9/19- spasticity much improved 9/26- spasticity controlled per pt- con't regimen 21. 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 If so, will order SLP eval.  22. Wound abscess- in superficial tissues, not actual deep wound infection per Dr Darnella 9/9-11- NSU saw and put on keflex  for 7 days- will con't to monitor- is still draining- - per pt, Dr Darnella mentioned that if things weren't better at day 7 (9/15 at latest) that to call him back.  9/12-15 wound/drainage appear to be improving--continue with above plan 9/23- assessed wound yesterday- looks open, however didn't appear to have drainage- will reassess.  23. L knee pain  9/23- will add Voltaren  gel 4x/day for L knee  9/26- hasn't been complaining of knee pain since started     LOS: 30 days A FACE TO FACE EVALUATION WAS PERFORMED  [address] Malita Ignasiak 05/12/2024, 10:11 AM

## 2024-05-13 ENCOUNTER — Other Ambulatory Visit (HOSPITAL_COMMUNITY): Payer: Self-pay

## 2024-05-13 LAB — COMPREHENSIVE METABOLIC PANEL WITH GFR
ALT: 105 U/L — ABNORMAL HIGH (ref 0–44)
AST: 47 U/L — ABNORMAL HIGH (ref 15–41)
Albumin: 3 g/dL — ABNORMAL LOW (ref 3.5–5.0)
Alkaline Phosphatase: 189 U/L — ABNORMAL HIGH (ref 38–126)
Anion gap: 9 (ref 5–15)
BUN: 15 mg/dL (ref 6–20)
CO2: 27 mmol/L (ref 22–32)
Calcium: 9.3 mg/dL (ref 8.9–10.3)
Chloride: 101 mmol/L (ref 98–111)
Creatinine, Ser: 0.59 mg/dL — ABNORMAL LOW (ref 0.61–1.24)
GFR, Estimated: >60 mL/min (ref >60)
Glucose, Bld: 93 mg/dL (ref 70–99)
Potassium: 4.1 mmol/L (ref 3.5–5.1)
Sodium: 137 mmol/L (ref 135–145)
Total Bilirubin: 0.7 mg/dL (ref 0.0–1.2)
Total Protein: 7.3 g/dL (ref 6.5–8.1)

## 2024-05-13 LAB — CBC
HCT: 33.8 % — ABNORMAL LOW (ref 39.0–52.0)
Hemoglobin: 11.1 g/dL — ABNORMAL LOW (ref 13.0–17.0)
MCH: 28.9 pg (ref 26.0–34.0)
MCHC: 32.8 g/dL (ref 30.0–36.0)
MCV: 88 fL (ref 80.0–100.0)
Platelets: 315 K/uL (ref 150–400)
RBC: 3.84 MIL/uL — ABNORMAL LOW (ref 4.22–5.81)
RDW: 12.9 % (ref 11.5–15.5)
WBC: 4.7 K/uL (ref 4.0–10.5)
nRBC: 0 % (ref 0.0–0.2)

## 2024-05-13 MED ORDER — ASCORBIC ACID 1000 MG PO TABS
1000.0000 mg | ORAL_TABLET | Freq: Every day | ORAL | 0 refills | Status: DC
  Filled 2024-05-13: qty 30, 30d supply, fill #0

## 2024-05-13 MED ORDER — ZINC SULFATE 220 (50 ZN) MG PO TABS
220.0000 mg | ORAL_TABLET | Freq: Every day | ORAL | 0 refills | Status: DC
  Filled 2024-05-13: qty 30, 30d supply, fill #0

## 2024-05-13 MED ORDER — SENNOSIDES-DOCUSATE SODIUM 8.6-50 MG PO TABS
3.0000 | ORAL_TABLET | Freq: Every day | ORAL | 0 refills | Status: DC
  Filled 2024-05-13: qty 90, 30d supply, fill #0

## 2024-05-13 MED ORDER — GABAPENTIN 300 MG PO CAPS
600.0000 mg | ORAL_CAPSULE | Freq: Three times a day (TID) | ORAL | 0 refills | Status: DC
  Filled 2024-05-13: qty 180, 30d supply, fill #0

## 2024-05-13 MED ORDER — APIXABAN 2.5 MG PO TABS
2.5000 mg | ORAL_TABLET | Freq: Two times a day (BID) | ORAL | 0 refills | Status: DC
  Filled 2024-05-13: qty 60, 30d supply, fill #0

## 2024-05-13 MED ORDER — POLYETHYLENE GLYCOL 3350 17 GM/SCOOP PO POWD
17.0000 g | Freq: Every day | ORAL | 0 refills | Status: AC
  Filled 2024-05-13: qty 476, 28d supply, fill #0

## 2024-05-13 MED ORDER — ORAL CARE MOUTH RINSE: 15.0000 mL | Freq: Four times a day (QID) | OROMUCOSAL | Status: DC

## 2024-05-13 MED ORDER — ASPIRIN 81 MG PO TBEC
81.0000 mg | DELAYED_RELEASE_TABLET | Freq: Every day | ORAL | 0 refills | Status: DC
  Filled 2024-05-13: qty 100, 100d supply, fill #0

## 2024-05-13 MED ORDER — METHOCARBAMOL 500 MG PO TABS
1000.0000 mg | ORAL_TABLET | Freq: Three times a day (TID) | ORAL | 0 refills | Status: DC | PRN
  Filled 2024-05-13: qty 180, 30d supply, fill #0

## 2024-05-13 MED ORDER — BACLOFEN 10 MG PO TABS
10.0000 mg | ORAL_TABLET | Freq: Three times a day (TID) | ORAL | 0 refills | Status: DC
  Filled 2024-05-13: qty 90, 30d supply, fill #0

## 2024-05-13 MED ORDER — ATOMOXETINE HCL 25 MG PO CAPS
25.0000 mg | ORAL_CAPSULE | Freq: Every day | ORAL | 0 refills | Status: DC
  Filled 2024-05-13: qty 30, 30d supply, fill #0

## 2024-05-13 MED ORDER — BISACODYL 10 MG RE SUPP
10.0000 mg | Freq: Every day | RECTAL | 0 refills | Status: DC
  Filled 2024-05-13: qty 30, 30d supply, fill #0
  Filled 2024-05-14: qty 12, 12d supply, fill #0

## 2024-05-13 MED ORDER — ENSURE PLUS HIGH PROTEIN PO LIQD: 237.0000 mL | Freq: Three times a day (TID) | ORAL | Status: DC

## 2024-05-13 MED ORDER — TIZANIDINE HCL 4 MG PO TABS
4.0000 mg | ORAL_TABLET | Freq: Three times a day (TID) | ORAL | 0 refills | Status: DC
  Filled 2024-05-13: qty 90, 30d supply, fill #0
  Filled 2024-06-12: qty 30, 10d supply, fill #1

## 2024-05-13 MED ORDER — OXYCODONE HCL 10 MG PO TABS
10.0000 mg | ORAL_TABLET | ORAL | 0 refills | Status: AC | PRN
  Filled 2024-05-13: qty 42, 5d supply, fill #0

## 2024-05-13 MED ORDER — DICLOFENAC SODIUM 1 % EX GEL
4.0000 g | Freq: Four times a day (QID) | CUTANEOUS | 0 refills | Status: DC
  Filled 2024-05-13: qty 100, 7d supply, fill #0

## 2024-05-13 MED ORDER — ADULT MULTIVITAMIN W/MINERALS CH: 1.0000 | ORAL_TABLET | Freq: Every day | ORAL | Status: DC

## 2024-05-13 MED ORDER — MEDIHONEY WOUND/BURN DRESSING EX PSTE
1.0000 | PASTE | Freq: Every day | CUTANEOUS | 0 refills | Status: DC
  Filled 2024-05-13: qty 44, 44d supply, fill #0

## 2024-05-13 MED ORDER — MIDODRINE HCL 10 MG PO TABS
20.0000 mg | ORAL_TABLET | Freq: Three times a day (TID) | ORAL | 0 refills | Status: DC
  Filled 2024-05-13: qty 180, 30d supply, fill #0

## 2024-05-13 MED ORDER — DULOXETINE HCL 60 MG PO CPEP
60.0000 mg | ORAL_CAPSULE | Freq: Every day | ORAL | 0 refills | Status: DC
  Filled 2024-05-13: qty 30, 30d supply, fill #0

## 2024-05-13 MED ORDER — HYDROCERIN EX CREA
1.0000 | TOPICAL_CREAM | Freq: Every day | CUTANEOUS | 0 refills | Status: DC
  Filled 2024-05-13: qty 113, 28d supply, fill #0

## 2024-05-13 NOTE — Plan of Care (Signed)
  Problem: SCI BOWEL ELIMINATION Goal: RH STG MANAGE BOWEL WITH ASSISTANCE Description: STG Manage Bowel with max Assistance. Outcome: Progressing

## 2024-05-13 NOTE — Progress Notes (Signed)
 Orthopedic Tech Progress Note Patient Details:  Tyrone Mckinney [birth year] [id number]  Ortho Devices Type of Ortho Device: Velcro wrist splint Ortho Device/Splint Location: lue Ortho Device/Splint Interventions: Ordered, Application, Adjustment  I called and talked to the rn to ask if they wanted a velcro splint or a real fiberglass splint. They said that a velcro was what was needed. When I arrived to the room and saw the patient they had a velcro thumb spica on. I told them that I was unsure if they needed a new velcro thumb spica or just regular velcro wrist splint and that we might need to wait for clarification. The patient said they were supposed to get a normal one according to what the dr told them. I applied the velcro wrist. Post Interventions Patient Tolerated: Well Instructions Provided: Care of device, Adjustment of device  Chandra Dorn PARAS 05/13/2024, 8:45 PM

## 2024-05-13 NOTE — Progress Notes (Signed)
 Met with patient last Friday and  today re: bowel program. Per patient wife knows how to do bowel program and has done one time. Nursing made aware to let wife do the bowel program tonight.

## 2024-05-13 NOTE — Progress Notes (Signed)
 Occupational Therapy Discharge Summary  Patient Details  Name: Tyrone Mckinney MRN: [id number] Date of Birth: 06-03-1981  Date of Discharge from OT service:May 13, 2024   Patient has met 1 of 5 long term goals due to {due to:3041651}.  Pt made limited progress with self care during this admission. Pt requires tot A for bathing at shower level in rolling shower chair, tot A for dressing at bed level and w/c level. Pt is able to assist with self feeding (max A) and brushing teeth (max A) with universal cuff/wrist support.TB transfers with tot A+2. Family (wife and dtrs) have been present for therapy and have transferred pt with use of Hoyer lift. Per pt, family has Youth worker life for use at home. Patient to discharge at overall {LOA:3049010} level.  Patient's care partner {care partner:3041650} to provide the necessary {assistance:3041652} assistance at discharge.    Reasons goals not met: Pt requires tot A for bathing/dressing tasks and TB transfers  Recommendation:  Patient will benefit from ongoing skilled OT services in {setting:3041680} to continue to advance functional skills in the area of {ADL/iADL:3041649}.  Equipment: No equipment provided  Reasons for discharge: {Reason for discharge:3049018}  Patient/family agrees with progress made and goals achieved: {Pt/Family agree with progress/goals:3049020}  OT Discharge ADL ADL Eating: Maximal assistance Where Assessed-Eating: Wheelchair Grooming: Maximal assistance Where Assessed-Grooming: Wheelchair Upper Body Bathing: Dependent Where Assessed-Upper Body Bathing: Shower Lower Body Bathing: Dependent Where Assessed-Lower Body Bathing: Shower Upper Body Dressing: Dependent Where Assessed-Upper Body Dressing: Wheelchair Lower Body Dressing: Dependent Where Assessed-Lower Body Dressing: Bed level Toileting: Dependent Where Assessed-Toileting: Bed level Toilet Transfer: Dependent Toilet Transfer Method:  Conservation officer, historic buildings Transfer: Dependent Tub/Shower Transfer Method:  (rolling shower chair) Tub/Shower Equipment: Other (comment) (rolling shower chair) Film/video editor: Not assessed Vision Baseline Vision/History: 1 Wears glasses Patient Visual Report: No change from baseline Vision Assessment?: No apparent visual deficits Perception  Perception: (P) Within Functional Limits Praxis Praxis: (P) WFL Cognition Cognition Overall Cognitive Status: (P) Within Functional Limits for tasks assessed Arousal/Alertness: Awake/alert Orientation Level: Person;Place;Situation Person: Oriented Place: Oriented Situation: Oriented Memory: Impaired Memory Impairment: Decreased recall of new information Awareness: Appears intact Problem Solving: Appears intact Safety/Judgment: (P) Appears intact Brief Interview for Mental Status (BIMS) Repetition of Three Words (First Attempt): 3 Temporal Orientation: Year: Correct Temporal Orientation: Month: Accurate within 5 days Temporal Orientation: Day: Correct Recall: Sock: Yes, no cue required Recall: Blue: Yes, no cue required Recall: Bed: No, could not recall BIMS Summary Score: 13 Sensation Sensation Light Touch: (P) Impaired Detail Peripheral sensation comments: (P) Sporadic sensation R LE Light Touch Impaired Details: (P) Impaired LLE;Absent LLE;Impaired RLE;Impaired RUE;Impaired LUE Proprioception: (P) Impaired by gross assessment Coordination Gross Motor Movements are Fluid and Coordinated: (P) No Fine Motor Movements are Fluid and Coordinated: (P) No Coordination and Movement Description: (P) 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 Motor  Motor Motor: (P) Tetraplegia;Abnormal postural alignment and control Mobility  Bed Mobility Rolling Right: Total Assistance - Patient < 25% Rolling Left: Total Assistance - Patient < 25% Sit to Supine: Dependent - Patient equal 0%  Trunk/Postural Assessment  Cervical  Assessment Cervical Assessment: Exceptions to [facility] (forward with lateral tilt to Rt) Thoracic Assessment Thoracic Assessment: Exceptions to [facility] (rounded shoulders) Lumbar Assessment Lumbar Assessment: Exceptions to [facility] (posterior pelvic tilt) Postural Control Postural Control: Deficits on evaluation Head Control: lateral tilt to right Trunk Control: Absent Righting Reactions: Absent  Balance Static Sitting Balance Static Sitting - Balance Support: Feet supported Static  Sitting - Level of Assistance: 1: +1 Total assist Dynamic Sitting Balance Sitting balance - Comments: total assistane Extremity/Trunk Assessment RUE Assessment RUE Assessment: Exceptions to [facility] Active Range of Motion (AROM) Comments: WFL General Strength Comments: Delt 2+/5, biceps 3- to 3/5, triceps, wrist and digits 0/5 LUE Assessment LUE Assessment: Exceptions to [facility] General Strength Comments: Delt 1/5, biceps 2/5, triceps, wrist, and digits all 0/5   Tyrone Mckinney 05/13/2024, 10:45 AM

## 2024-05-13 NOTE — Consult Note (Signed)
 WOC Nurse wound follow up Wound type: Healing DTPI to L heel Measurement: 2 cm x 2 cm Wound azi:ijmx purple maroon callous with edges lifting to reveal healed skin Drainage (amount, consistency, odor) none Periwound: intact Dressing procedure/placement/frequency:  Clean with warm water, pat dry. Apply Foam dressing to the L heel. Change every 3 days or PRN. Lift dressing daily to inspect site.  Patient reports elevating bilateral heels utilizing Prevalon boots (lawson # [id number]) when in bed.   WOC Nurse team will follow with you and see patient within 10 days for wound assessments.  Please notify WOC nurses of any acute changes in the wounds or any new areas of concern  Thank you,  Doyal Polite, MSN, RN, [facility] WOC Team [phone] (Available Mon-Fri 0700-1500)

## 2024-05-13 NOTE — Progress Notes (Signed)
 Patient with one incontinent bowel movement at 0225 this shift. Patient voiding with purewick in place, no issues noted. Per patient, wife is comfortable doing bowel program but wife not at bedside this shift to verify. Dressing changed to posterior cervical site. Moderate amount of drainage noted with some tissue build up. Wound care treatment followed per nursing order.

## 2024-05-13 NOTE — Progress Notes (Signed)
 Physical Therapy Session Note  Patient Details  Name: Tyrone Mckinney MRN: [id number] Date of Birth: [birth year]  Today's Date: 05/13/2024 PT Individual Time: 8584-8541 PT Individual Time Calculation (min): 43 min   Short Term Goals: Week 1:  PT Short Term Goal 1 (Week 1): Pt will roll to Left w/ total A consistently. PT Short Term Goal 1 - Progress (Week 1): Met PT Short Term Goal 2 (Week 1): Pt will tolerate OOB in w/c x 2 hours. PT Short Term Goal 2 - Progress (Week 1): Met PT Short Term Goal 3 (Week 1): Pt will tolerate sitting EOB x 5' w/ total A. PT Short Term Goal 3 - Progress (Week 1): Met Week 2:  PT Short Term Goal 1 (Week 2): Pt will maintain static sitting with UE support with max a >30 sec PT Short Term Goal 1 - Progress (Week 2): Progressing toward goal PT Short Term Goal 2 (Week 2): Pt will recall and teach back pressure relief strategies PT Short Term Goal 2 - Progress (Week 2): Progressing toward goal PT Short Term Goal 3 (Week 2): Pt will be able to direct transfer with board with min cueing PT Short Term Goal 3 - Progress (Week 2): Met Week 3:  PT Short Term Goal 1 (Week 3): Pt will recall and teach back pressure relief strategies PT Short Term Goal 1 - Progress (Week 3): Progressing toward goal PT Short Term Goal 2 (Week 3): Pt will initiate PWC navigation with assist PT Short Term Goal 2 - Progress (Week 3): Met PT Short Term Goal 3 (Week 3): Pt will direct transfers without cue PT Short Term Goal 3 - Progress (Week 3): Progressing toward goal  Skilled Therapeutic Interventions/Progress Updates:    Pt presents up in [facility] - agreeable to session. States that ATP from [place] was here with loaner chair earlier and worked on it for [employer] but ultimately needed to take it back to the office to reprogram it, so unable to trial it today. Discussed with primary OT as well to try to check on patient before d/c as pt going home in the [facility] with transport. Discussed with pt that  if ATP comes later, to ask nursing to use lift to get pt in loaner w/c while ATP here to make adjustments if needed as there will not be time in the AM for them to come out before planned d/c. Pt verbalized understanding. Pt performed PWC mobility on unit with S with set up of features initially by PT. NMR during W/c <> mat table transfers with +total +2 assist with slideboard with focus on anterior weightshift and WB through LLE during transfer. NMR for dynamic sitting balance and trunk activation with therapy ball in front of patient with PT supported either UE for anterior weightshift and trunk activation, stabilization to find balance point and then release back x 5-6 trials. Transferred back to w/c and focused on NMR seated with activation in LLE for LAQ and hip adduction squeezes to medicine ball x 10 reps each. Discussed and educated on importance of BLE stretching/ROM home program and pt verbalized understanding. Returned to room with al lneeds in reach and sip and puff set up.  Therapy Documentation Precautions:  Precautions Precautions: Cervical, Fall Precaution/Restrictions Comments: MAP >65 Required Braces or Orthoses: Splint/Cast Splint/Cast: LUE- WBAT 9/29 with splint (was PWB through elbow only prior) Splint/Cast - Date Prophylactic Dressing Applied (if applicable):  (on) Restrictions Weight Bearing Restrictions Per Provider Order: Yes LUE Weight Bearing  Per Provider Order: Partial weight bearing LUE Partial Weight Bearing Percentage or Pounds: through elbow/forearm OK; once splint placed can WBAT 9/29 Other Position/Activity Restrictions: Forearm based thumb spica LUE (per ortho); WHO RUE, B PRAFOs    Pain: No c/o pain.   Therapy/Group: Individual Therapy  Elnor Pizza Sherrell Pizza WENDI Elnor, PT, DPT, CBIS  05/13/2024, 3:05 PM

## 2024-05-13 NOTE — Progress Notes (Signed)
 Physical Therapy Discharge Summary  Patient Details  Name: Tyrone Mckinney MRN: [id number] Date of Birth: 11-10-1980  Date of Discharge from PT service:May 13, 2024    Patient has met 3 of 5 long term goals due to improved activity tolerance, improved balance, improved postural control, increased strength, decreased pain, and ability to compensate for deficits.  Patient to discharge at a wheelchair level S for mobility but total +2 for mobility.   Patient's care partner is independent to provide the necessary physical assistance at discharge and completed family education prior to d/c.  Reasons goals not met: Pt requires +2 for bed mobility and up to total A for dynamic sitting balance.  Recommendation:  Patient will benefit from ongoing skilled PT services in home health setting to continue to advance safe functional mobility, address ongoing impairments in strength, balance, coordination, postural control, functional mobility, ROM, endurance, and minimize fall risk.  Equipment: PWC through [employer], slideboard, and pt family ordered a hoyer lift with sling  Reasons for discharge: treatment goals met and discharge from hospital  Patient/family agrees with progress made and goals achieved: Yes  PT Discharge Precautions/Restrictions Precautions Precautions: Cervical;Fall Required Braces or Orthoses: Splint/Cast Splint/Cast: LUE- WBAT 9/29 with splint (was PWB through elbow only prior) Restrictions Weight Bearing Restrictions Per Provider Order: Yes LUE Weight Bearing Per Provider Order: Partial weight bearing LUE Partial Weight Bearing Percentage or Pounds: through elbow/forearm OK; once splint placed can WBAT 9/29  Pain Interference Pain Interference Pain Effect on Sleep: 2. Occasionally Pain Interference with Therapy Activities: 1. Rarely or not at all Pain Interference with Day-to-Day Activities: 1. Rarely or not at all Vision/Perception  Vision -  History Ability to See in Adequate Light: 0 Adequate Perception Perception: Within Functional Limits Praxis Praxis: WFL  Cognition Overall Cognitive Status: Within Functional Limits for tasks assessed Arousal/Alertness: Awake/alert Memory: Impaired Memory Impairment: Decreased recall of new information Awareness: Appears intact Problem Solving: Appears intact Safety/Judgment: Appears intact Sensation Sensation Light Touch: Impaired Detail Peripheral sensation comments: Sporadic sensation R LE Light Touch Impaired Details: Impaired LLE;Absent LLE;Impaired RLE;Impaired RUE;Impaired LUE Proprioception: Impaired by gross assessment Coordination Gross Motor Movements are Fluid and Coordinated: No Fine Motor Movements are Fluid and Coordinated: No Coordination and Movement Description: 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 Motor  Motor Motor: Tetraplegia;Abnormal postural alignment and control  Mobility Bed Mobility Rolling Right: Max A Rolling Left: Max A Sit to Supine: 2 helpers Transfers Transfers: Consulting civil engineer Transfers: 2 Advertising account planner (Assistive device):  (slideboard) Locomotion  Gait Gait: No Pick up small object from the floor (from standing position) activity did not occur: Safety/medical concerns Naval architect Mobility: Yes Wheelchair Assistance: Doctor, general practice: [employer] Wheelchair [employer] Management: Other (comment);Needs assistance  Trunk/Postural Assessment  Cervical Assessment Cervical Assessment: Exceptions to [facility] (forward with lateral tilt to Rt) Thoracic Assessment Thoracic Assessment: Exceptions to [facility] (rounded shoulders) Lumbar Assessment Lumbar Assessment: Exceptions to [facility] (posterior pelvic tilt) Postural Control Postural Control: Deficits on evaluation Head Control: lateral tilt to right Trunk Control: Absent Righting Reactions: Absent  Balance Balance Balance  Assessed: Yes Static Sitting Balance Static Sitting - Level of Assistance: 2: Max assist Dynamic Sitting Balance Sitting balance - Comments: totalA Extremity Assessment  RUE Assessment RUE Assessment: Exceptions to [facility] Active Range of Motion (AROM) Comments: WFL General Strength Comments: Delt 2+/5, biceps 3- to 3/5, triceps, wrist and digits 0/5 LUE Assessment LUE Assessment: Exceptions to [facility] General Strength Comments: Delt 1/5, biceps 2/5, triceps, wrist,  and digits all 0/5 RLE Strength Right Hip Flexion: 1/5 Right Hip ABduction: 0/5 Right Ankle Dorsiflexion: 0/5 Right Ankle Plantar Flexion: 0/5 LLE Strength Left Hip Flexion: 2-/5 Left Hip ABduction: 2-/5 Left Hip ADduction: 2-/5 Left Knee Extension: 3-/5 Left Ankle Dorsiflexion: 3/5 Left Ankle Plantar Flexion: 3/5   Elnor Pizza Sherrell Pizza WENDI Elnor, PT, DPT, CBIS  05/13/2024, 12:49 PM

## 2024-05-13 NOTE — Progress Notes (Signed)
 Physical Therapy Session Note  Patient Details  Name: Tyrone Mckinney MRN: [id number] Date of Birth: [birth year]  Today's Date: 05/13/2024 PT Individual Time: 1015-1055 PT Individual Time Calculation (min): 40 min   Short Term Goals: Week 3:  PT Short Term Goal 1 (Week 3): Pt will recall and teach back pressure relief strategies PT Short Term Goal 1 - Progress (Week 3): Progressing toward goal PT Short Term Goal 2 (Week 3): Pt will initiate PWC navigation with assist PT Short Term Goal 2 - Progress (Week 3): Met PT Short Term Goal 3 (Week 3): Pt will direct transfers without cue PT Short Term Goal 3 - Progress (Week 3): Progressing toward goal Week 4:  PT Short Term Goal 1 (Week 4): STG = LTG due to ELOS  Skilled Therapeutic Interventions/Progress Updates:    Pt presents in bed reporting he needs to be change (urine). Focused on bed mobility including rolling for clothing management, brief change, changing pants due to being soiled, and hygiene - total A needed. Cues for technique provided. Initiated d/c assessment for sensation and MMT scoring (see d/c summary) and discussed plan for w/c and transport home. Per hand off, awaiting pt's power w/c to be delivered today with intent for pt to go home via transportation in that w/c. Pt denies concerns with d/c and reports family was here last week.  Per MD, pt to be WBAT on LUE once splint arrives (was ordered this AM).   End of session left in bed with DME rep to see patient for equipment delivery (not w/c).  Therapy Documentation Precautions:  Precautions Precautions: Cervical, Fall Precaution/Restrictions Comments: MAP >65 Required Braces or Orthoses: Splint/Cast Splint/Cast: LUE- WBAT 9/29 with splint (was PWB through elbow only prior) Splint/Cast - Date Prophylactic Dressing Applied (if applicable):  (on) Restrictions Weight Bearing Restrictions Per Provider Order: Yes LUE Weight Bearing Per Provider Order: Partial weight  bearing LUE Partial Weight Bearing Percentage or Pounds: through elbow/forearm OK; once splint placed can WBAT 9/29 Other Position/Activity Restrictions: Forearm based thumb spica LUE (per ortho); WHO RUE, B PRAFOs    Pain: Reports pain is ok right now.    Therapy/Group: Individual Therapy  Tyrone Mckinney, PT, DPT, CBIS  05/13/2024, 12:40 PM

## 2024-05-13 NOTE — Progress Notes (Signed)
 Occupational Therapy Session Note  Patient Details  Name: Tyrone Mckinney MRN: [id number] Date of Birth: 1980-11-19  Today's Date: 05/13/2024 OT Individual Time: 9299-9184 OT Individual Time Calculation (min): 75 min    Short Term Goals: Week 4:  OT Short Term Goal 1 (Week 4): STG=LTG 2/2 ELOS  Skilled Therapeutic Interventions/Progress Updates:    Skilled OT intervention with focus on bed mobility, directing care, discharge planning, and activity tolerance to increase independence with BADLs and prepare for discharge home tomorrow. All rolling R/L with tot A+1 and pt hooking UE on bed rails. LB dressing at bed level. UB dressing seated in chair positin in Kreg bed. MD and PA attended to pt to assess cervical incision. Pt assisted with brushing teeth using universal cuff/wrist support. Pt looking forward to going home tomorrow. Pt remained in bed. Pt able to activated sip n puff.  Therapy Documentation Precautions:  Precautions Precautions: Cervical, Fall Precaution/Restrictions Comments: MAP >65 Splint/Cast - Date Prophylactic Dressing Applied (if applicable):  (on) Restrictions Weight Bearing Restrictions Per Provider Order: Yes LUE Weight Bearing Per Provider Order: Partial weight bearing Other Position/Activity Restrictions: Forearm based thumb spica LUE (per ortho); WHO RUE, B PRAFOs Pain:  Pt reports his Rt shoulder feels ok ADL: ADL Eating: Dependent Where Assessed-Eating: Bed level Grooming: Dependent Where Assessed-Grooming: Bed level Upper Body Bathing: Dependent Where Assessed-Upper Body Bathing: Bed level Lower Body Bathing: Dependent Where Assessed-Lower Body Bathing: Bed level Upper Body Dressing: Dependent Where Assessed-Upper Body Dressing: Bed level Lower Body Dressing: Dependent Where Assessed-Lower Body Dressing: Bed level Toileting: Dependent Where Assessed-Toileting: Bed level Toilet Transfer: Not assessed Toilet Transfer Method: Not  assessed Tub/Shower Transfer: Not assessed Walk-In Shower Transfer: Not assessed   Therapy/Group: Individual Therapy  [name] 05/13/2024, 8:26 AM

## 2024-05-13 NOTE — Progress Notes (Signed)
 PROGRESS NOTE   Subjective/Complaints:  Pt reports doing fine- nursing brought it to my attention family hasn't learned to do the bowel program.   He cannot leave without that training  Also, something looked off about incision.  Per nursing.   Pt reports no issues.  Said had BM last night     ROS: as per HPI.    Pt denies SOB, abd pain, CP, N/V/C/D, and vision changes    Objective:   No results found.    Recent Labs    05/13/24 0558  WBC 4.7  HGB 11.1*  HCT 33.8*  PLT 315      Recent Labs    05/13/24 0558  NA 137  K 4.1  CL 101  CO2 27  GLUCOSE 93  BUN 15  CREATININE 0.59*  CALCIUM  9.3           Intake/Output Summary (Last 24 hours) at 05/13/2024 1011 Last data filed at 05/13/2024 0536 Gross per 24 hour  Intake --  Output 750 ml  Net -750 ml         Physical Exam: Vital Signs Blood pressure (!) 90/58, pulse 65, temperature 98.9 F (37.2 C), temperature source Oral, resp. rate 16, height 6' 4 (1.93 m), weight 88.9 kg, SpO2 98%.     General: awake, alert, appropriate, woke him up; NAD HENT: conjugate gaze; oropharynx moist CV: regular rate; no JVD Pulmonary: CTA B/L; no W/R/R- good air movement GI: soft, NT, ND, (+)BS Psychiatric: appropriate Neurological: Ox3  Skin: incision when initially een had a large piece of skin on it and wound looked open- per nursing/OT- when I saw it, it had that large piece come off on dressing and a small hypergranulated piece sticking up form the mouth of closed incision closed by delayed wound healing - no drainage seen on incision Neuro: A&O x4, memory intact, insight and awareness intact.   PRIOR EXAMS: MSK- mild swelling of L knee- no pain or TTP over actual knee or IT band- but elicited pain with abduction and rotation of L hip. Ext: no clubbing, cyanosis, or edema except mild swelling of L knee- maybe mild effusion   MSK: generalized  tenderness to palpation in shoulders/upper arms with bed mobility and AROM/PROM Skin: minimal drainage from lower aspect of incision, sacral wound dressed.  Neurological:     Mental Status: He is alert.     Comments: Alert and oriented x 3. Normal insight and awareness. Intact Memory. Normal language and speech. Cranial nerve exam unremarkable. MMT: RUE: delt 2+/5, biceps 3- to 3/5, triceps maybe trace, wrist and HI all 0/5.  LUE: delt 1/5, biceps 2/5, triceps, wrist, HI all 0/5. RLE: 1/5 HE and ~1+/5 KE, 0/5 everywhere else. LLE: 2 to 2+/5 HF, HAD and KE, ADF/PF 2+/5. Sensory exam diminished below C5 on right and ~C6 on left. 1/2 sensation in trunk and both LE, as well as inguinal/rectal area. Sensed sl more in LLE. DTR's 1+ in BUE and 3+ in BLE. Pt without resting BLE tone.  Perhaps a beat or two of clonus in either foot.    Assessment/Plan: 1. Functional deficits which require 3+ hours per day of interdisciplinary  therapy in a comprehensive inpatient rehab setting. Physiatrist is providing close team supervision and 24 hour management of active medical problems listed below. Physiatrist and rehab team continue to assess barriers to discharge/monitor patient progress toward functional and medical goals  Care Tool:  Bathing    Body parts bathed by patient: Chest   Body parts bathed by helper: Buttocks, Right upper leg, Left upper leg, Right lower leg, Left lower leg, Face, Right arm, Left arm, Abdomen, Front perineal area     Bathing assist Assist Level: Total Assistance - Patient < 25%     Upper Body Dressing/Undressing Upper body dressing   What is the patient wearing?: Pull over shirt    Upper body assist Assist Level: Total Assistance - Patient < 25%    Lower Body Dressing/Undressing Lower body dressing      What is the patient wearing?: Incontinence brief, Pants     Lower body assist Assist for lower body dressing: 2 Helpers     Toileting Toileting    Toileting assist  Assist for toileting: Dependent - Patient 0%     Transfers Chair/bed transfer  Transfers assist     Chair/bed transfer assist level: Dependent - mechanical lift     Locomotion Ambulation   Ambulation assist   Ambulation activity did not occur: Safety/medical concerns          Walk 10 feet activity   Assist  Walk 10 feet activity did not occur: Safety/medical concerns        Walk 50 feet activity   Assist Walk 50 feet with 2 turns activity did not occur: Safety/medical concerns         Walk 150 feet activity   Assist Walk 150 feet activity did not occur: Safety/medical concerns         Walk 10 feet on uneven surface  activity   Assist Walk 10 feet on uneven surfaces activity did not occur: Safety/medical concerns         Wheelchair     Assist Is the patient using a wheelchair?: Yes Type of Wheelchair: Power    Wheelchair assist level: Contact Guard/Touching assist, Minimal Assistance - Patient > 75% Max wheelchair distance: 200+    Wheelchair 50 feet with 2 turns activity    Assist        Assist Level: Minimal Assistance - Patient > 75%   Wheelchair 150 feet activity     Assist      Assist Level: Minimal Assistance - Patient > 75%   Blood pressure (!) 90/58, pulse 65, temperature 98.9 F (37.2 C), temperature source Oral, resp. rate 16, height 6' 4 (1.93 m), weight 88.9 kg, SpO2 98%.  Medical Problem List and Plan: 1. 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 Darnella of [facility] on 03/28/24:             -patient may not yet shower             -ELOS/Goals: 05/14/24.  mod to max assist goals             -No collar needed per Dr. Darnella             -Bilateral  PRAFO's, R WHO.  -Will switch PRAFOs to Prevalons due to spasticity  D/c date 9/30, power w/c education Educated that he's supposed to have BM with bowel program, not afterwards- also educated that if don't do at home, which was planned, will go into AD- which can be very dangerous- he voiced understanding Pt and family need to learn bowel program prior to leaving Dr Murrell said can weight brea with wrist brace Pt seen twice this AM Con't CIR -d/c tomorrow  2.  Antithrombotics: -DVT/anticoagulation:  Pharmaceutical: Lovenox  30mg  BID 9/1- is (-) for DVT 9/4- needs Elqiuis or Lovenox  for a total of 3 months ideally-  9/26- will go home on 1 month of Lovenox  or Eliquis- hopefully Medicaid will kick in by then, and can get another 2- 4 weeks of meds             -antiplatelet therapy: ASA 81mg  daily 3. Pain Management: Cymbalta  30mg  nightly, Gabapentin  600mg  TID, Tylenol  1000mg  q6h, Robaxin  1000mg  q8h, oxycodone  10-15mg  q4h prn,  -Consider increasing Cymbalta  to 60mg  nightly for improved pain control if needed -Pt developing early hypertonicity in LE's. Will begin scheduling tizanidine  at 2mg  tid to start. Can have 2mg  tid prn as well -04/13/24 decrease tylenol  650mg  QID d/t transminitis as below 9/1- changed Cymbalta  to qday since had nightmares last night- didn't take trazodone . 9/3- still having nightmares, but not med related- might be PTSD? Vs subconscious dealing with his injury? 9/15- pain is improved. Pt satsified 9/18- will increase Cymbalta  to 60 mg daily- to help mood which I'm concerned about as well as pain 9/19 patient reported upper body soreness today. Told me he was satisfied with pain mgt currently 9/23- added voltaren  gel for L knee 9/25- 9/27- pain mgmt working adequately- con't regimen 9/29- pain regimen working well 4. Mood/Behavior/Sleep: ego support             -antipsychotic agents: n/a             -Psych consult inpatient, no needs at that time -Pt will need emotional  support. Seems to be pretty upbeat. Family sounds supportive. Will have Dr. Corina see pt at some point during admit  9/4- D/w pt and family today that he's very 'bright- but I'm expecting him to hit a wall when all this really hits.  9/19 cymbalta  increased to 60mg  as above 5. Neuropsych/cognition: This patient is capable of making decisions on his own behalf. 6. Skin/Wound Care/large forehead laceration: staples removed 04/04/24.  Routine skin care.  9/8- incision on posterior neck puffy, draining, called Dr Darnella via NP 7. Fluids/Electrolytes/Nutrition: routine I&O, routine labs, continue vitamins/supplements (Ensure plus high protein BID, MVI)             -Regular diet 8. 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 Lester of [facility], continue ASA 81mg  daily 9. Superior endplate compression fx T5, T7, T8, T9, B/L upper thoracic posterior paraspinal muscle injury and edema, interspinous ligament injury T2-3 and possibly T3-4: no intervention needed. Per Dr. Darnella.  10. B/L apical pulmonary contusions, small pulmonary lac RLL: aggressive pulmonary toilet 11. L Ulnar styloid fx: NWB LUE, splint, f/up Dr. Murrell 9/4- when gets closer to 6 weeks, will call Dr Marjorie to go over when can WBAT 9/9- cannot see a surgery done? So 6 weeks from 8/14- called Dr Marjorie- ordered a wrist xray to f/u- and unlikely to be able to [facility] prior to d/c  -05/04/24 it's been just over 6wks now, consider talking to Dr. Murrell this week 9/23- will call today-L/M for Dr Murrell  to call me back and see what the next step is- since it's been 6 weeks 9/25- called and got xrays- pending Dr Murrell 9/26- will have NP call and double check plan 9/29- can weight bear as tolerated with L Wrist splint 12. Orthostatic hypotension: continue Midodrine  20mg  TID, Strattera  25mg  daily also started per Dr. Jyl recs with goal perhaps of coming off midodrine . -continue TEDs and abdominal binder -04/14/24 BPs improving,  monitor 9/1-9/2 in general BP's soft, in 100s-110s. Supine, but doing better- con't regimen 9/3-9/4 BP's running low 90's to 110's- no concern about OH currently per therapy -9/6-7/25 BP occasionally elevated briefly but improves, no AD sxs, monitor 9/8- BP 131 systolic this AM! Sitting upright 9/15--bp remains very soft. Continue acclimation with PT 9/16- 9/17- BP still very soft, but doing better in last 24 hours- low 100's systolic- -con't regimen 9/18-19- BP's soft but stable  -continue midodrine  and strattera  -9/25-9/27  BPs improving, usually has spike in afternoon, monitor Vitals:   05/09/24 1425 05/09/24 1943 05/10/24 0309 05/10/24 1650  BP: 121/75 105/77 93/63 133/88   05/10/24 1937 05/11/24 0454 05/11/24 1300 05/11/24 1934  BP: 128/85 97/71 115/77 136/83   05/12/24 0249 05/12/24 1604 05/12/24 1929 05/13/24 0538  BP: 94/69 100/74 (!) 140/88 (!) 90/58    13. Neurogenic bowel: on  bowel program: Dulcolax suppository 10mg  daily, Miralax  BID, SenokotS 1 tab BID             -will adjust bowel program to 1800 daily             -senokot-s 2 tabs q am and miralax  BID  9/5- no BM last night and none with another bowel program this AM in spite of being done-- will give Sorbitol  60cc now and then SSE this evening with bowel program- ordered -04/21/24 small BM last night-- monitor 9/9- Will give 60cc Sorbitol  at 2pm and SE at 6pm at part of bowel program- cannot add Linzess since isn't insured- might need at home to take Mg citrate every few days- also taking a lot of Oxycodone  as well - hopefully this will decrease over time.  9/11 having response to bowel program but stool too soft  -hold miralax  bid and 3 senna-s's daily for now  -resume them in some form tomorrow 9/12 formed stool last night, resume daily miralax  tomorrow and 2 senna-s tabs today -9/15 had a large T6 bm at 0100 this am 9/16- Had large BM overnight with bowel program 9/17- had large BM at 11:45 pm- 9/18-19 no bm or  bowel program documented---I am follow up with nursing. Needs to be on program -9/20-21/25 BM overnight, getting bowel program. Monitor. 9/23- Small BM and minimal results from day bowel program - if no good BM by tomorrow, will do sorbitol   9/26- Small BM overnight- if no results or small results will need Sorbitol  on Saturday -05/11/24 small BM overnight-- per weekday team, will do sorbitol  now.  9/29- pt said having BM's- hasn't learned bowel program- asked staff to have pt's family learn before d/c tomorrow 14. Neurogenic bladder:              -adjust I/O cath schedule to volumes of 300-500 cc -04/13/24 apparently used condom cath last night-- does feel urge to urinate, no bladder scans documented but pt says they were done but no ISC needed -Reviewed with nursing the plan for him, no condom cath for now, attempt with urinal q4-6h and bladder scan -ISC if scan >250cc 9/4- still  not voiding with toileting 9/5- I still think he's kicking off, but not emptying- he has no feeling of voiding, just leaking-- same 9/6-7, no caths still, but incontinent 9/12-15 pt has been incontinent with low to medium PVR's. He insists that he can feel when he needs to empty and that staff can't get to him in time. He has mentioned not wanting to bother nursing so much.  -continue timed voids q3-4 hours and obsv  -encouraged pt to notify staff when he needs to empty -continue voiding trial/education 9/16- Pt says staff cannot get to him, but mentions the time is very short- like 1-2 minutes at times- of note- con't regimen 9/17-9/19 still kicking off, regularly incontinent. Consider antispasmodic and I/O caths vs condom cath/bag.  -05/11/24 pt is able to tell when he needs to urinate just doesn't have very much time before incontinent-- OT witnessed this today, was able to use urinal just needed it handed to him quickly.  15. Polysubstance use disorder: UDS +cocaine, THC, Benzos-- provide counseling 16.  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 F/up outpatient.  17. ABLA: hgb stable 8-9 range, monitor twice weekly  -04/13/24 Hgb 9.6, monitor>> stable 9/4 at 10 18. Transaminitis: -04/13/24 Alk phos 286, AST 58, ALT 153-- could be from shock liver, monitor again on Monday. Decrease tylenol  to 650mg  QID. No abd pain/n/v but consider imaging/work up if those develop.  9/1-  Down to 35 and 119 from 91 and 153- will reduction in Tylenol - -if not better Thursday, will reduce Tylenol  further 9/4- will stop scheduled tylenol  and make 325 mg q6 hours prn if need be 9/8- AST/ALT about the same- 48/110- and alk phos 213 (alk phos is down substantially)- if not down by Thursday, will reassess 9/9- cannot start Dantrolene with these issues 9/16- AST 42 up from 29 and ALT 95 up from 74- will recheck Thursday- because not taking tylenol - could be baclofen , but I don't know what else can can do for spasticity? Cannot increase Zanaflex  due to low BP 9/18- AST 31 down from 42 and ALT 80 down from 95- doing better 9/23- will recheck Thursday 9/26- AST 60 up from 31 and ALT 130 up from 80- will call IM next week if not back down on Monday 9/29- AST 47 and ALT 105- is doing better- pt needs to avoid Tylenol  at home completely.  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 - pt had BP yesterday of 166/92- pt admits had HA- so had AD- educated pt and family on AD- will continue to educate staff, because we were not called on the probable AD episode yesterday- 166/92 was BP- isolated spike in BP = AD esp in pt that has OH- went over with PA and staff about how to treat- sit pt up, loosen tight clothing, then if doesn't improve, cath him; do bowel program- that's 80% of all AD causes- if not improved, let PA/Doctor know 9/5- Had BP of 177/92 this AM- likely AD, although no  HA- nurse did great catch and tried to do bowel program- no results- will order another round of Sorbitol  60cc with SSE this evening to get him cleaned out- esp with constipation -04/20/24 occasionally elevated BP but improves quickly, no AD symptoms, monitor 9/9- had AD yesterday AM- no HA or nasal congestions but BP -9/12-15 no further AD. Having daily results with bowel program.  20. Spasticity-  9/5- will increase Baclofen  to 10 mg TID_ and first dose at 630am- d/w nursing and therapy that spasticity is progressing- once it starts, it's pretty fast initially- usually starts around 3-4 weeks and progresses, as I educated family about- and unfortunately we will try to keep up, but it's hard- he also has Zanaflex  2 mg TID AND 2 mg TID prn for additional spasms 9/8- has improved spasticity-  9/9- spasms much worse this AM- like didn't get meds, but per chart, received Baclofen  at 546 this AM- will increase Zanaflex  to 4 mg TID- it's progressing so fast- not sure due to inflammation from mild incision infection- on keflex - can set it off 9/12-15 continue above schedule. Spasticity is MUCH improved compared to admit 9/16-9/19- spasticity much improved 9/26- spasticity controlled per pt- con't regimen 21. 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 If so, will order SLP eval.  22. Wound abscess- in superficial tissues, not actual deep wound infection per Dr Darnella 9/9-11- NSU saw and put on keflex  for 7 days- will con't to monitor- is still draining- - per pt, Dr Darnella mentioned that if things weren't better at day 7 (9/15 at latest) that to call him back.  9/12-15 wound/drainage appear to be improving--continue with above plan 9/23- assessed wound yesterday- looks open, however  didn't appear to have drainage- will reassess.  23. L knee pain  9/23- will add Voltaren  gel 4x/day for L knee  9/26- hasn't been complaining of knee pain since started   Will silver nitrate the Hyper granulation tissue on neck incision today- dw PA  I spent a total of 52   minutes on total care today- >50% coordination of care- due to  D/w team about bowel program as wel las nursing x2 and pt seen again also d/w PA and OT  LOS: 31 days A FACE TO FACE EVALUATION WAS PERFORMED  Binh Doten 05/13/2024, 10:11 AM

## 2024-05-13 NOTE — Progress Notes (Signed)
 Patient states that he and his wife have been educated and trained on how to perform the bowel program. He feels comfortable performing it at home.

## 2024-05-13 NOTE — Progress Notes (Signed)
 1IP Rehab Bowel Program Documentation   Bowel Program Start time [phone]  Dig Stim Indicated? Yes  Dig Stim Prior to Suppository or mini Enema X 2   Output from dig stim: Moderate  Ordered intervention: Suppository Yes , mini enema No ,   Repeat dig stim after Suppository or Mini enema  X 1  Output? Large   Bowel Program Complete? Yes , handoff given Yes  Patient Tolerated? Yes

## 2024-05-13 NOTE — Progress Notes (Signed)
 Physical Therapy Session Note  Patient Details  Name: Tyrone Mckinney MRN: [id number] Date of Birth: [birth year]  Today's Date: 05/13/2024 PT Individual Time: 1117-1203 PT Individual Time Calculation (min): 46 min   Short Term Goals: Week 4:  PT Short Term Goal 1 (Week 4): STG = LTG due to ELOS  Skilled Therapeutic Interventions/Progress Updates:      Pt received supine in bed and agrees to therapy. No complaint of pain> Supine to sit with cues for logrolling and body mechanics, requiring maxA overall. Pt completes slideboard transfer from bed to [facility] on Lt side with totalA +1. Pt drives WC to gym with cues for safe WC management and navigation, x300' total. Pt performs slideboard transfer to Nustep with totalA +2 and cues for initiation, sequencing, and body mechanics. Pt competes Nustep for NMR and reciprocal coordination training. Pt completes x8:00 at workload of 1 (increased to 2 at 5:00) with BLEs and RUE. PT utilizes thigh stabilization bar on RLE and grip facilitator on RUE. Pt able to completes ~25 steps per minute. Pt performs slideboard transfer back to [facility] with maxA +2. Pt drives WC back to room. Left seated with all needs within reach.  Therapy Documentation Precautions:  Precautions Precautions: Cervical, Fall Precaution/Restrictions Comments: MAP >65 Required Braces or Orthoses: Splint/Cast Splint/Cast: LUE- WBAT 9/29 with splint (was PWB through elbow only prior) Splint/Cast - Date Prophylactic Dressing Applied (if applicable):  (on) Restrictions Weight Bearing Restrictions Per Provider Order: Yes LUE Weight Bearing Per Provider Order: Partial weight bearing LUE Partial Weight Bearing Percentage or Pounds: through elbow/forearm OK; once splint placed can WBAT 9/29 Other Position/Activity Restrictions: Forearm based thumb spica LUE (per ortho); WHO RUE, B PRAFOs    Therapy/Group: Individual Therapy  Elsie JAYSON Dawn, PT, DPT 05/13/2024, 3:55 PM

## 2024-05-14 ENCOUNTER — Other Ambulatory Visit (HOSPITAL_COMMUNITY): Payer: Self-pay

## 2024-05-14 NOTE — Progress Notes (Signed)
 PROGRESS NOTE   Subjective/Complaints:  Pt reports asking about transport to appointments- educated pt hat cannot do unless has medicaid then gets from [place].   Worried won't get meds- educated him might cost something today from [facility]- but really needs Strattera  and Lovenox -    Had medium  and large BM with bowel program- wife is trained in bowel program and feels comfortable with it.    ROS: as per HPI.    Pt denies SOB, abd pain, CP, N/V/C/D, and vision changes    Objective:   No results found.    Recent Labs    05/13/24 0558  WBC 4.7  HGB 11.1*  HCT 33.8*  PLT 315      Recent Labs    05/13/24 0558  NA 137  K 4.1  CL 101  CO2 27  GLUCOSE 93  BUN 15  CREATININE 0.59*  CALCIUM  9.3           Intake/Output Summary (Last 24 hours) at 05/14/2024 0816 Last data filed at 05/14/2024 0547 Gross per 24 hour  Intake 240 ml  Output 500 ml  Net -260 ml         Physical Exam: Vital Signs Blood pressure 96/60, pulse 66, temperature 98.6 F (37 C), temperature source Oral, resp. rate 17, height 6' 4 (1.93 m), weight 88.9 kg, SpO2 97%.      General: awake, alert, appropriate, woke him from sleep- sitting up in bed;  NAD HENT: conjugate gaze; oropharynx moist CV: regular rate and rhythm; no JVD Pulmonary: CTA B/L; no W/R/R- good air movement GI: soft, NT, ND, (+)BS- normoactive Psychiatric: appropriate- anxious about meds Neurological: Ox3   Skin: Not assessed today  incision when initially een had a large piece of skin on it and wound looked open- per nursing/OT- when I saw it, it had that large piece come off on dressing and a small hypergranulated piece sticking up form the mouth of closed incision closed by delayed wound healing - no drainage seen on incision Neuro: A&O x4, memory intact, insight and awareness intact.   PRIOR EXAMS: MSK- mild swelling of L knee- no pain or TTP over  actual knee or IT band- but elicited pain with abduction and rotation of L hip. Ext: no clubbing, cyanosis, or edema except mild swelling of L knee- maybe mild effusion   MSK: generalized tenderness to palpation in shoulders/upper arms with bed mobility and AROM/PROM Skin: minimal drainage from lower aspect of incision, sacral wound dressed.  Neurological:     Mental Status: He is alert.     Comments: Alert and oriented x 3. Normal insight and awareness. Intact Memory. Normal language and speech. Cranial nerve exam unremarkable. MMT: RUE: delt 2+/5, biceps 3- to 3/5, triceps maybe trace, wrist and HI all 0/5.  LUE: delt 1/5, biceps 2/5, triceps, wrist, HI all 0/5. RLE: 1/5 HE and ~1+/5 KE, 0/5 everywhere else. LLE: 2 to 2+/5 HF, HAD and KE, ADF/PF 2+/5. Sensory exam diminished below C5 on right and ~C6 on left. 1/2 sensation in trunk and both LE, as well as inguinal/rectal area. Sensed sl more in LLE. DTR's 1+ in BUE and 3+ in BLE.  Pt without resting BLE tone.  Perhaps a beat or two of clonus in either foot.    Assessment/Plan: 1. Functional deficits which require 3+ hours per day of interdisciplinary therapy in a comprehensive inpatient rehab setting. Physiatrist is providing close team supervision and 24 hour management of active medical problems listed below. Physiatrist and rehab team continue to assess barriers to discharge/monitor patient progress toward functional and medical goals  Care Tool:  Bathing    Body parts bathed by patient: Chest   Body parts bathed by helper: Buttocks, Right upper leg, Left upper leg, Right lower leg, Left lower leg, Face, Right arm, Left arm, Abdomen, Front perineal area     Bathing assist Assist Level: Total Assistance - Patient < 25%     Upper Body Dressing/Undressing Upper body dressing   What is the patient wearing?: Pull over shirt    Upper body assist Assist Level: Total Assistance - Patient < 25%    Lower Body Dressing/Undressing Lower  body dressing      What is the patient wearing?: Incontinence brief, Pants     Lower body assist Assist for lower body dressing: Dependent - Patient 0%     Toileting Toileting    Toileting assist Assist for toileting: Dependent - Patient 0%     Transfers Chair/bed transfer  Transfers assist     Chair/bed transfer assist level: 2 Helpers     Locomotion Ambulation   Ambulation assist   Ambulation activity did not occur: Safety/medical concerns (quadriplegia)          Walk 10 feet activity   Assist  Walk 10 feet activity did not occur: Safety/medical concerns        Walk 50 feet activity   Assist Walk 50 feet with 2 turns activity did not occur: Safety/medical concerns         Walk 150 feet activity   Assist Walk 150 feet activity did not occur: Safety/medical concerns         Walk 10 feet on uneven surface  activity   Assist Walk 10 feet on uneven surfaces activity did not occur: Safety/medical concerns         Wheelchair     Assist Is the patient using a wheelchair?: Yes Type of Wheelchair: Power    Wheelchair assist level: Supervision/Verbal cueing Max wheelchair distance: 200+    Wheelchair 50 feet with 2 turns activity    Assist        Assist Level: Supervision/Verbal cueing   Wheelchair 150 feet activity     Assist      Assist Level: Supervision/Verbal cueing   Blood pressure 96/60, pulse 66, temperature 98.6 F (37 C), temperature source Oral, resp. rate 17, height 6' 4 (1.93 m), weight 88.9 kg, SpO2 97%.  Medical Problem List and Plan: 1. 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 Darnella of [facility] on 03/28/24:             -patient may  not yet shower             -ELOS/Goals: 05/14/24.  mod to max assist goals             -No collar needed per Dr. Darnella             -  Bilateral PRAFO's, R WHO.  -Will switch PRAFOs to Prevalons due to spasticity D/c date 9/30, power w/c education Educated that he's supposed to have BM with bowel program, not afterwards- also educated that if don't do at home, which was planned, will go into AD- which can be very dangerous- he voiced understanding Pt and family need to learn bowel program prior to leaving Dr Murrell said can weight brea with wrist brace D/c today Has transport set up for 10:30 if I remember correctly- let NP know 2.  Antithrombotics: -DVT/anticoagulation:  Pharmaceutical: Lovenox  30mg  BID 9/1- is (-) for DVT 9/4- needs Elqiuis or Lovenox  for a total of 3 months ideally-  9/26- will go home on 1 month of Lovenox  or Eliquis- hopefully Medicaid will kick in by then, and can get another 2- 4 weeks of meds 9/30- let nursing know going home on lovenox              -antiplatelet therapy: ASA 81mg  daily 3. Pain Management: Cymbalta  30mg  nightly, Gabapentin  600mg  TID, Tylenol  1000mg  q6h, Robaxin  1000mg  q8h, oxycodone  10-15mg  q4h prn,  -Consider increasing Cymbalta  to 60mg  nightly for improved pain control if needed -Pt developing early hypertonicity in LE's. Will begin scheduling tizanidine  at 2mg  tid to start. Can have 2mg  tid prn as well -04/13/24 decrease tylenol  650mg  QID d/t transminitis as below 9/1- changed Cymbalta  to qday since had nightmares last night- didn't take trazodone . 9/3- still having nightmares, but not med related- might be PTSD? Vs subconscious dealing with his injury? 9/15- pain is improved. Pt satsified 9/18- will increase Cymbalta  to 60 mg daily- to help mood which I'm concerned about as well as pain 9/19 patient reported upper body soreness today. Told me he was satisfied with pain mgt currently 9/23- added voltaren  gel for L knee 9/25- 9/27- pain mgmt working  adequately- con't regimen 9/29- pain regimen working well 4. Mood/Behavior/Sleep: ego support             -antipsychotic agents: n/a             -Psych consult inpatient, no needs at that time -Pt will need emotional support. Seems to be pretty upbeat. Family sounds supportive. Will have Dr. Corina see pt at some point during admit  9/4- D/w pt and family today that he's very 'bright- but I'm expecting him to hit a wall when all this really hits.  9/19 cymbalta  increased to 60mg  as above 5. Neuropsych/cognition: This patient is capable of making decisions on his own behalf. 6. Skin/Wound Care/large forehead laceration: staples removed 04/04/24.  Routine skin care.  9/8- incision on posterior neck puffy, draining, called Dr Darnella via NP 7. Fluids/Electrolytes/Nutrition: routine I&O, routine labs, continue vitamins/supplements (Ensure plus high protein BID, MVI)             -Regular diet 8. 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 Lester of [facility], continue ASA 81mg  daily 9. Superior endplate compression fx T5, T7, T8, T9, B/L upper thoracic posterior paraspinal muscle injury and edema, interspinous ligament injury T2-3 and possibly T3-4: no intervention needed. Per Dr. Darnella.  10. B/L apical pulmonary contusions, small pulmonary lac RLL: aggressive pulmonary toilet 11. L Ulnar styloid fx: NWB LUE, splint, f/up Dr. Murrell 9/4- when gets closer to 6 weeks, will call Dr Marjorie to go over when can WBAT 9/9- cannot see a surgery done? So 6 weeks from 8/14- called Dr Marjorie- ordered a wrist xray to f/u- and unlikely to be able to  WBAT prior to d/c  -05/04/24 it's been just over 6wks now, consider talking to Dr. Murrell this week 9/23- will call today-L/M for Dr Murrell to call me back and see what the next step is- since it's been 6 weeks 9/25- called and got xrays- pending Dr Murrell 9/26- will have NP call and double check plan 9/29- can weight bear as tolerated with L Wrist splint 12.  Orthostatic hypotension: continue Midodrine  20mg  TID, Strattera  25mg  daily also started per Dr. Jyl recs with goal perhaps of coming off midodrine . -continue TEDs and abdominal binder -04/14/24 BPs improving, monitor 9/1-9/2 in general BP's soft, in 100s-110s. Supine, but doing better- con't regimen 9/3-9/4 BP's running low 90's to 110's- no concern about OH currently per therapy -9/6-7/25 BP occasionally elevated briefly but improves, no AD sxs, monitor 9/8- BP 131 systolic this AM! Sitting upright 9/15--bp remains very soft. Continue acclimation with PT 9/16- 9/17- BP still very soft, but doing better in last 24 hours- low 100's systolic- -con't regimen 9/18-19- BP's soft but stable  -continue midodrine  and strattera  -9/25-9/27  BPs improving, usually has spike in afternoon, monitor Vitals:   05/10/24 1650 05/10/24 1937 05/11/24 0454 05/11/24 1300  BP: 133/88 128/85 97/71 115/77   05/11/24 1934 05/12/24 0249 05/12/24 1604 05/12/24 1929  BP: 136/83 94/69 100/74 (!) 140/88   05/13/24 0538 05/13/24 1636 05/13/24 2025 05/14/24 0529  BP: (!) 90/58 123/76 109/76 96/60    13. Neurogenic bowel: on  bowel program: Dulcolax suppository 10mg  daily, Miralax  BID, SenokotS 1 tab BID             -will adjust bowel program to 1800 daily             -senokot-s 2 tabs q am and miralax  BID  9/5- no BM last night and none with another bowel program this AM in spite of being done-- will give Sorbitol  60cc now and then SSE this evening with bowel program- ordered -04/21/24 small BM last night-- monitor 9/9- Will give 60cc Sorbitol  at 2pm and SE at 6pm at part of bowel program- cannot add Linzess since isn't insured- might need at home to take Mg citrate every few days- also taking a lot of Oxycodone  as well - hopefully this will decrease over time.  9/11 having response to bowel program but stool too soft  -hold miralax  bid and 3 senna-s's daily for now  -resume them in some form tomorrow 9/12 formed  stool last night, resume daily miralax  tomorrow and 2 senna-s tabs today -9/15 had a large T6 bm at 0100 this am 9/16- Had large BM overnight with bowel program 9/17- had large BM at 11:45 pm- 9/18-19 no bm or bowel program documented---I am follow up with nursing. Needs to be on program -9/20-21/25 BM overnight, getting bowel program. Monitor. 9/23- Small BM and minimal results from day bowel program - if no good BM by tomorrow, will do sorbitol   9/26- Small BM overnight- if no results or small results will need Sorbitol  on Saturday -05/11/24 small BM overnight-- per weekday team, will do sorbitol  now.  9/29- pt said having BM's- hasn't learned bowel program- asked staff to have pt's family learn before d/c tomorrow 14. Neurogenic bladder:              -adjust I/O cath schedule to volumes of 300-500 cc -04/13/24 apparently used condom cath last night-- does feel urge to urinate, no bladder scans documented but pt says they were done but no ISC  needed -Reviewed with nursing the plan for him, no condom cath for now, attempt with urinal q4-6h and bladder scan -ISC if scan >250cc 9/4- still not voiding with toileting 9/5- I still think he's kicking off, but not emptying- he has no feeling of voiding, just leaking-- same 9/6-7, no caths still, but incontinent 9/12-15 pt has been incontinent with low to medium PVR's. He insists that he can feel when he needs to empty and that staff can't get to him in time. He has mentioned not wanting to bother nursing so much.  -continue timed voids q3-4 hours and obsv  -encouraged pt to notify staff when he needs to empty -continue voiding trial/education 9/16- Pt says staff cannot get to him, but mentions the time is very short- like 1-2 minutes at times- of note- con't regimen 9/17-9/19 still kicking off, regularly incontinent. Consider antispasmodic and I/O caths vs condom cath/bag.  -05/11/24 pt is able to tell when he needs to urinate just doesn't have very  much time before incontinent-- OT witnessed this today, was able to use urinal just needed it handed to him quickly.  15. Polysubstance use disorder: UDS +cocaine, THC, Benzos-- provide counseling 16. 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 F/up outpatient.  17. ABLA: hgb stable 8-9 range, monitor twice weekly  -04/13/24 Hgb 9.6, monitor>> stable 9/4 at 10 18. Transaminitis: -04/13/24 Alk phos 286, AST 58, ALT 153-- could be from shock liver, monitor again on Monday. Decrease tylenol  to 650mg  QID. No abd pain/n/v but consider imaging/work up if those develop.  9/1-  Down to 42 and 119 from 15 and 153- will reduction in Tylenol - -if not better Thursday, will reduce Tylenol  further 9/4- will stop scheduled tylenol  and make 325 mg q6 hours prn if need be 9/8- AST/ALT about the same- 48/110- and alk phos 213 (alk phos is down substantially)- if not down by Thursday, will reassess 9/9- cannot start Dantrolene with these issues 9/16- AST 42 up from 29 and ALT 95 up from 74- will recheck Thursday- because not taking tylenol - could be baclofen , but I don't know what else can can do for spasticity? Cannot increase Zanaflex  due to low BP 9/18- AST 31 down from 42 and ALT 80 down from 95- doing better 9/23- will recheck Thursday 9/26- AST 60 up from 31 and ALT 130 up from 80- will call IM next week if not back down on Monday 9/29- AST 47 and ALT 105- is doing better- pt needs to avoid Tylenol  at home completely.  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 - pt had BP yesterday of 166/92- pt admits had HA- so had AD- educated pt and family on AD- will continue to educate staff, because we were not called on the probable AD episode yesterday- 166/92 was BP- isolated spike in BP = AD esp in pt that has OH- went over with PA and staff about how to treat- sit pt  up, loosen tight clothing, then if doesn't improve, cath him; do bowel program- that's 80% of all AD causes- if not improved, let PA/Doctor know 9/5- Had BP of 177/92 this AM- likely AD, although no HA- nurse did great catch and tried to do bowel program- no results- will order another round of Sorbitol  60cc with SSE this evening to get him cleaned out- esp with constipation -04/20/24 occasionally elevated BP but improves quickly, no  AD symptoms, monitor 9/9- had AD yesterday AM- no HA or nasal congestions but BP -9/12-15 no further AD. Having daily results with bowel program.    20. Spasticity-  9/5- will increase Baclofen  to 10 mg TID_ and first dose at 630am- d/w nursing and therapy that spasticity is progressing- once it starts, it's pretty fast initially- usually starts around 3-4 weeks and progresses, as I educated family about- and unfortunately we will try to keep up, but it's hard- he also has Zanaflex  2 mg TID AND 2 mg TID prn for additional spasms 9/8- has improved spasticity-  9/9- spasms much worse this AM- like didn't get meds, but per chart, received Baclofen  at 546 this AM- will increase Zanaflex  to 4 mg TID- it's progressing so fast- not sure due to inflammation from mild incision infection- on keflex - can set it off 9/12-15 continue above schedule. Spasticity is MUCH improved compared to admit 9/16-9/19- spasticity much improved 9/26- spasticity controlled per pt- con't regimen 21. 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 If so, will order SLP eval.  22. Wound abscess- in superficial tissues, not actual deep wound infection per Dr Darnella 9/9-11- NSU saw and put on keflex  for 7 days- will con't to monitor- is still draining- - per pt, Dr Darnella mentioned that if  things weren't better at day 7 (9/15 at latest) that to call him back.  9/12-15 wound/drainage appear to be improving--continue with above plan 9/23- assessed wound yesterday- looks open, however didn't appear to have drainage- will reassess.  23. L knee pain  9/23- will add Voltaren  gel 4x/day for L knee  9/26- hasn't been complaining of knee pain since started   Will silver nitrate the Hyper granulation tissue on neck incision today- dw PA  I spent a total of 33   minutes on total care today- >50% coordination of care- due to  D/w pt about meds, transportation and f/u. Also avoid tylenol .    The patient is medically ready for discharge to home and will need follow-up with [facility] ASAP. In addition, they will need to follow up with their PCP,  , Neurosurgery, and Orthopedics. Dr Franky Curia Educated pt there's not a way to get coverage for tranpsort to his appointments if he doesn't get medicaid   LOS: 32 days A FACE TO FACE EVALUATION WAS PERFORMED  Tyrone Mckinney 05/14/2024, 8:16 AM

## 2024-05-14 NOTE — Progress Notes (Signed)
 Inpatient Rehabilitation Discharge Medication Review by a Pharmacist  A complete drug regimen review was completed for this patient to identify any potential clinically significant medication issues.  High Risk Drug Classes Is patient taking? Indication by Medication  Antipsychotic No   Anticoagulant Yes Apixaban - VTE prophylaxis  Antibiotic No   Opioid Yes PRN Oxycodone  - moderate or severe pain  Antiplatelet Yes Aspirin  81 mg - vascular injury   Hypoglycemics/insulin No   Vasoactive Medication Yes Atomoxetine , Midodrine  - othrostatic hypotension  Chemotherapy No   Other Yes Baclofen  - extremity muscle spasms Duloxetine  - mood and pain Gabapentin  - neuropathic pain Diclofenac  gel (left knee/ houlders) - topical pain relief Tizanidine  - hypertonicity in lower extremities Vitamin C , zinc , multivitamin - supplements Bisacodyl  PR, Miralax , Senna-docusate - laxatives  PRNs: Methocarbamol  - central/torso/neck muscle spasms     Type of Medication Issue Identified Description of Issue Recommendation(s)  Drug Interaction(s) (clinically significant)     Duplicate Therapy      Allergy     No Medication Administration End Date     Incorrect Dose     Additional Drug Therapy Needed     Significant med changes from prior encounter (inform family/care partners about these prior to discharge). No medications PTA. All are new.   Other       Clinically significant medication issues were identified that warrant physician communication and completion of prescribed/recommended actions by midnight of the next day:  No  Pharmacist comments:  - Avoiding Acetaminophen  with transaminitis.  Time spent performing this drug regimen review (minutes):  20   Genaro Zebedee Calin, [place] 05/14/2024 9:19 AM

## 2024-05-14 NOTE — Progress Notes (Signed)
 Wound measurements updated; healing. Educated daughter on wound dressing re: neck incision. Wound supplies given to patient.

## 2024-05-15 NOTE — Progress Notes (Signed)
 Inpatient Rehabilitation Care Coordinator Discharge Note   Patient Details  Name: Tyrone Mckinney MRN: [id number] Date of Birth: 11-24-1980   Discharge location: D/c to home  Length of Stay: 31 days  Discharge activity level: wheelchair level S for mobility but total +2 for mobility  Home/community participation: Limited  Patient response un:Yzjouy Literacy - How often do you need to have someone help you when you read instructions, pamphlets, or other written material from your doctor or pharmacy?: Never  Patient response un:Dnrpjo Isolation - How often do you feel lonely or isolated from those around you?: Rarely  Services provided included: MD, RD, PT, OT, RN, CM, Pharmacy, Neuropsych, SW, TR  Financial Services:  Field seismologist Utilized: Other (Comment) (Uninsured)    Choices offered to/list presented to: N/A  Follow-up services arranged:  Outpatient, DME    Outpatient Servicies: [facility] for [facility] and [facility] for PT- will choose from either program DME : [facility] for sling and TTB. [facility] for specialty w/c. Family was able to get hospital bed and hoyer lift.    Patient response to transportation need: Is the patient able to respond to transportation needs?: Yes In the past 12 months, has lack of transportation kept you from medical appointments or from getting medications?: No In the past 12 months, has lack of transportation kept you from meetings, work, or from getting things needed for daily living?: No   Patient/Family verbalized understanding of follow-up arrangements:  Yes  Individual responsible for coordination of the follow-up plan: contact pt or pt wife Lyle  Confirmed correct DME delivered: Graeme DELENA Jude 05/15/2024    Comments (or additional information):fam edu completed  Summary of Stay    Date/Time Discharge Planning CSW  05/06/24 1134 Uninsured. Pt will d/c to home with his ex-wife, and children.  Reports his brother from [place]  is coming to visit-unsure on his length of stay. Fam meeting held last week. Family to begin looking for DME. Fam edu on TR/Fr 8am-10am on 9/18;9/19 and next week 9/25;9/26. SW will confirm there are no barriers to discharge. AAC  04/30/24 0951 Uninsured. Pt will d/c to home with his ex-wife, and children. Reports his brother from [place]  is coming to visit-unsure on his length of stay. Fam meeting held last week. Family to begin looking for DME. SW will confirm there are no barriers to discharge. AAC  04/22/24 1449 Uninsured. Pt will d/c to home with his ex-wife, and children. Reports his brother from [place]  is coming to visit-unsure on his length of stay. Fam meeting held last week. Family to begin looking for DME. SW will confirm there are no barriers to discharge. AAC  04/15/24 1129 Uninsured. Pt will d/c to home with his ex-wife, and children. Reports his brother from [place]  is coming to visit-unsure on his length of stay. SW will confirm there are no barriers to discharge. AAC       Trace Cederberg A Jude

## 2024-05-15 NOTE — Progress Notes (Signed)
 Recreational Therapy Discharge Summary Patient Details  Name: Tyrone Mckinney MRN: [id number] Date of Birth: 08-16-1980 Today's Date: 05/15/2024  Comments on progress toward goals: Pt has progress during LOS and is discharging home with family to provide 24 hour supervision/care.  Pt is supervision for w/c mobility using power w/c.  TR sessions focused on pt education.  Discussions included aspects of wellness (physical, emotional, social, spiritual, cognitive) and how they impact each other and overall wellness.  Also discussed stress management/coping, community resources including transportation, leisure and travel emphasizing quality of life after discharge.  Pt is anxious to return home with family.  Reasons for discharge: discharge from hospital  Follow-up: Home Health  Patient/family agrees with progress made and goals achieved: Yes  Tyrone Mckinney 05/15/2024, 8:34 AM

## 2024-05-20 DIAGNOSIS — I951 Orthostatic hypotension: Secondary | ICD-10-CM | POA: Insufficient documentation

## 2024-05-20 DIAGNOSIS — R7401 Elevation of levels of liver transaminase levels: Secondary | ICD-10-CM | POA: Insufficient documentation

## 2024-05-20 DIAGNOSIS — R252 Cramp and spasm: Secondary | ICD-10-CM | POA: Insufficient documentation

## 2024-05-20 DIAGNOSIS — G8220 Paraplegia, unspecified: Secondary | ICD-10-CM | POA: Insufficient documentation

## 2024-05-20 DIAGNOSIS — K592 Neurogenic bowel, not elsewhere classified: Secondary | ICD-10-CM | POA: Insufficient documentation

## 2024-05-20 DIAGNOSIS — M4800 Spinal stenosis, site unspecified: Secondary | ICD-10-CM | POA: Insufficient documentation

## 2024-05-20 DIAGNOSIS — G904 Autonomic dysreflexia: Secondary | ICD-10-CM | POA: Insufficient documentation

## 2024-05-20 DIAGNOSIS — S52612A Displaced fracture of left ulna styloid process, initial encounter for closed fracture: Secondary | ICD-10-CM | POA: Insufficient documentation

## 2024-05-27 ENCOUNTER — Encounter: Payer: MEDICAID | Attending: Physical Medicine and Rehabilitation | Admitting: Physical Medicine and Rehabilitation

## 2024-06-05 ENCOUNTER — Encounter: Payer: Self-pay | Admitting: Nurse Practitioner

## 2024-06-05 ENCOUNTER — Telehealth: Payer: Self-pay | Admitting: Nurse Practitioner

## 2024-06-05 ENCOUNTER — Other Ambulatory Visit: Payer: Self-pay

## 2024-06-05 DIAGNOSIS — G904 Autonomic dysreflexia: Secondary | ICD-10-CM

## 2024-06-05 DIAGNOSIS — T8131XA Disruption of external operation (surgical) wound, not elsewhere classified, initial encounter: Secondary | ICD-10-CM

## 2024-06-05 DIAGNOSIS — K592 Neurogenic bowel, not elsewhere classified: Secondary | ICD-10-CM

## 2024-06-05 DIAGNOSIS — S14101D Unspecified injury at C1 level of cervical spinal cord, subsequent encounter: Secondary | ICD-10-CM

## 2024-06-05 DIAGNOSIS — I951 Orthostatic hypotension: Secondary | ICD-10-CM

## 2024-06-05 DIAGNOSIS — R252 Cramp and spasm: Secondary | ICD-10-CM

## 2024-06-05 DIAGNOSIS — L089 Local infection of the skin and subcutaneous tissue, unspecified: Secondary | ICD-10-CM

## 2024-06-05 DIAGNOSIS — R238 Other skin changes: Secondary | ICD-10-CM

## 2024-06-05 MED ORDER — SENNOSIDES-DOCUSATE SODIUM 8.6-50 MG PO TABS
3.0000 | ORAL_TABLET | Freq: Every day | ORAL | 0 refills | Status: DC
  Filled 2024-06-05 – 2024-06-06 (×2): qty 90, 30d supply, fill #0

## 2024-06-05 MED ORDER — METHOCARBAMOL 500 MG PO TABS
1000.0000 mg | ORAL_TABLET | Freq: Three times a day (TID) | ORAL | 0 refills | Status: DC | PRN
Start: 2024-06-05 — End: 2024-07-06
  Filled 2024-06-05 – 2024-06-06 (×2): qty 240, 40d supply, fill #0

## 2024-06-05 MED ORDER — MIDODRINE HCL 10 MG PO TABS
20.0000 mg | ORAL_TABLET | Freq: Three times a day (TID) | ORAL | 0 refills | Status: DC
  Filled 2024-06-05 – 2024-06-12 (×2): qty 180, 30d supply, fill #0

## 2024-06-05 MED ORDER — BACLOFEN 10 MG PO TABS
10.0000 mg | ORAL_TABLET | Freq: Three times a day (TID) | ORAL | 0 refills | Status: DC
  Filled 2024-06-05 – 2024-06-06 (×2): qty 90, 30d supply, fill #0

## 2024-06-05 MED ORDER — CEPHALEXIN 500 MG PO CAPS
500.0000 mg | ORAL_CAPSULE | Freq: Four times a day (QID) | ORAL | 0 refills | Status: AC
  Filled 2024-06-05: qty 40, 10d supply, fill #0

## 2024-06-05 MED ORDER — ORAL CARE MOUTH RINSE
15.0000 mL | Freq: Four times a day (QID) | OROMUCOSAL | 0 refills | Status: AC
  Filled 2024-06-05 – 2024-07-31 (×5): qty 354, 6d supply, fill #0

## 2024-06-05 MED ORDER — BISACODYL 10 MG RE SUPP
10.0000 mg | Freq: Every day | RECTAL | 0 refills | Status: AC
  Filled 2024-06-05: qty 30, 30d supply, fill #0
  Filled 2024-06-05 – 2024-06-13 (×2): qty 12, 12d supply, fill #0
  Filled 2024-07-06: qty 12, 12d supply, fill #1
  Filled 2024-07-31: qty 6, 6d supply, fill #2

## 2024-06-05 MED ORDER — GABAPENTIN 300 MG PO CAPS
600.0000 mg | ORAL_CAPSULE | Freq: Three times a day (TID) | ORAL | 0 refills | Status: DC
  Filled 2024-06-05 – 2024-06-12 (×2): qty 180, 30d supply, fill #0

## 2024-06-05 MED ORDER — ATOMOXETINE HCL 25 MG PO CAPS
25.0000 mg | ORAL_CAPSULE | Freq: Every day | ORAL | 0 refills | Status: DC
  Filled 2024-06-05 – 2024-06-06 (×2): qty 30, 30d supply, fill #0

## 2024-06-05 MED ORDER — ADULT MULTIVITAMIN W/MINERALS CH
1.0000 | ORAL_TABLET | Freq: Every day | ORAL | 0 refills | Status: DC
Start: 2024-06-05 — End: 2024-07-06
  Filled 2024-06-05 – 2024-06-06 (×2): qty 30, 30d supply, fill #0

## 2024-06-05 MED ORDER — HYDROCERIN EX CREA
1.0000 | TOPICAL_CREAM | Freq: Every day | CUTANEOUS | 0 refills | Status: DC
  Filled 2024-06-05: qty 454, 90d supply, fill #0
  Filled 2024-06-13: qty 454, 113d supply, fill #0

## 2024-06-05 MED ORDER — ENSURE PLUS HIGH PROTEIN PO LIQD
237.0000 mL | Freq: Three times a day (TID) | ORAL | 0 refills | Status: AC
  Filled 2024-06-05 – 2024-06-13 (×4): qty 28440, 30d supply, fill #0

## 2024-06-05 MED ORDER — DICLOFENAC SODIUM 1 % EX GEL
4.0000 g | Freq: Four times a day (QID) | CUTANEOUS | 0 refills | Status: DC
  Filled 2024-06-05 – 2024-06-13 (×2): qty 400, 28d supply, fill #0

## 2024-06-05 MED ORDER — DULOXETINE HCL 60 MG PO CPEP
60.0000 mg | ORAL_CAPSULE | Freq: Every day | ORAL | 0 refills | Status: DC
  Filled 2024-06-05 – 2024-06-06 (×2): qty 30, 30d supply, fill #0

## 2024-06-05 MED ORDER — ASPIRIN 81 MG PO TBEC
81.0000 mg | DELAYED_RELEASE_TABLET | Freq: Every day | ORAL | 0 refills | Status: AC
  Filled 2024-06-05 – 2024-07-31 (×3): qty 100, 100d supply, fill #0

## 2024-06-05 MED ORDER — MEDIHONEY WOUND/BURN DRESSING EX PSTE
1.0000 | PASTE | Freq: Every day | CUTANEOUS | 0 refills | Status: AC
  Filled 2024-06-05 – 2024-07-31 (×5): qty 44, 44d supply, fill #0

## 2024-06-05 MED ORDER — ASCORBIC ACID 500 MG PO TABS
1000.0000 mg | ORAL_TABLET | Freq: Every day | ORAL | 0 refills | Status: DC
  Filled 2024-06-05 – 2024-06-06 (×2): qty 60, 30d supply, fill #0

## 2024-06-05 MED ORDER — ZINC SULFATE 220 (50 ZN) MG PO TABS
220.0000 mg | ORAL_TABLET | Freq: Every day | ORAL | 0 refills | Status: DC
Start: 2024-06-05 — End: 2024-07-06
  Filled 2024-06-05 – 2024-06-06 (×2): qty 30, 30d supply, fill #0

## 2024-06-05 MED ORDER — APIXABAN 2.5 MG PO TABS
2.5000 mg | ORAL_TABLET | Freq: Two times a day (BID) | ORAL | 0 refills | Status: DC
  Filled 2024-06-05 – 2024-08-06 (×8): qty 60, 30d supply, fill #0
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
## 2024-06-06 ENCOUNTER — Other Ambulatory Visit: Payer: Self-pay

## 2024-06-07 ENCOUNTER — Telehealth: Payer: Self-pay

## 2024-06-07 NOTE — Progress Notes (Signed)
 Complex Care Management Note  Care Guide Note 06/07/2024 Name: Jamere Stidham MRN: [id number] DOB: March 29, 1981  Isaul Eulalio Veals is a 43 y.o. year old male who sees Kennyth Domino, FNP for primary care. I reached out to Gabriella Eulalio Boers by phone today to offer complex care management services.  Mr. Radell was given information about Complex Care Management services today including:   The Complex Care Management services include support from the care team which includes your Nurse Care Manager, Clinical Social Worker, or Pharmacist.  The Complex Care Management team is here to help remove barriers to the health concerns and goals most important to you. Complex Care Management services are voluntary, and the patient may decline or stop services at any time by request to their care team member.   Complex Care Management Consent Status: Patient agreed to services and verbal consent obtained.   Follow up plan:  Telephone appointment with complex care management team member scheduled for:  [facility] 06/20/2024 LCSW 07/09/2024  Encounter Outcome:  Patient Scheduled  Jeoffrey Buffalo , RMA     [place]  [facility], [facility] Guide  Direct Dial: [phone]  Website: [email or web address]

## 2024-06-10 ENCOUNTER — Other Ambulatory Visit (HOSPITAL_BASED_OUTPATIENT_CLINIC_OR_DEPARTMENT_OTHER): Payer: Self-pay

## 2024-06-11 ENCOUNTER — Other Ambulatory Visit: Payer: Self-pay | Admitting: Pharmacist

## 2024-06-11 ENCOUNTER — Telehealth: Payer: Self-pay | Admitting: Pharmacist

## 2024-06-11 NOTE — Progress Notes (Signed)
   06/11/2024 Name: Tyrone Mckinney MRN: [id number] DOB: August 27, 1980  No chief complaint on file.   Tyrone Mckinney is a 43 y.o. year old male who presented for a telephone visit.   They were referred to the pharmacist by the [facility] Complex Case Management program for assistance in managing medication access.    Subjective:  Care Team: Primary Care Provider: Rosaline Bohr to est care on 07/29/2024.  Medication Access/Adherence  Current Pharmacy:  [facility] [address] [place] [address] Phone: [phone] Fax: [phone]  Patient reports affordability concerns with their medications: Yes  Patient reports access/transportation concerns to their pharmacy: Yes   Patient reports the following barriers to adherence: financial, transportation. Of note, per patient's wife, she is picking up medications from [facility] [place]. Was able to create a charge account to make payments towards her husband's total medication costs. Also, they have them enrolled in [facility]. They are currently waiting on Medicaid approval. She requests aid for transportation.   O:  N/A  Assessment/Plan:   Medication Management: - Pt is able to fill at [facility] [place].  - Message sent to Slater Diesel, our RN-Case Manager here at [facility] to see if she knows of any resources to assist in transportation. - Discussed continued collaboration with [place].  Herlene Fleeta Morris, PharmD, JAQUELINE, CPP Clinical Pharmacist [facility] [phone]
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
## 2024-06-11 NOTE — Telephone Encounter (Signed)
 I spoke to patient's wife Lyle and she explained that he has an appointment tomorrow at [facility] Neurosurgery and they do not have transportation.  She said that they submitted a Medicaid application when he was in the hospital in [facility] and are still waiting for approval. She said he has been approved for disability and they had to spend down money to qualify for Medicaid.    He would need wheelchair transportation to the appointment and she would plan to accompany him. I explained that I am not aware of any resources available for specialized transportation for tomorrow and any transportation at this point would be private pay. She said she understood and will call Neurosurgery to change the appointment. She said the neurosurgeon would not do a virtual visit.  She also said that she understands Medicaid will provide transportation to medical appointments; however, she said they are currently living in [place], not [facility] and she realizes that arranging transportation out of county will be difficult.  I asked her if she received any transportation resources from the SW when he was discharged from the hospital and she said no, she has reached out to [place], but has not heard back.  I told her that I will reach out to [place], SW to inquire about transportation options so he can get to his medical appointments.    Lyle said she will continue to check with DSS about the status of the Medicaid application.

## 2024-06-12 ENCOUNTER — Other Ambulatory Visit: Payer: Self-pay

## 2024-06-12 NOTE — Telephone Encounter (Signed)
 I spoke to Graeme Jude, LCSW and she explained that she has shared the transportation resources that are available for the patient with the family.  She said [employer] is the only option but there is a very limited service area.  I asked her about the plans for the patient to get to follow up appointments in [place] from [facility]) and she said she is not involved with scheduling appointments and arranging transportation to those appointments, she is just providing resources.  I spoke to patient's wife, Lyle and explained my conversation with [place].  I also told her that[employer] has a number for individuals living in [place] [facility]) to call about transportation: [phone].  I told her that I tried to all multiple times and the  phone just rang.  She said her daughter is working on transportation and she may have already contacted [facility] but she will check with her. I asked her if she rescheduled the neurosurgery appointment and she said not yet. She stated she called and cancelled the appointment for today and they told her to call back to re-schedule when he has his Medicaid and transportation.  She said the office told her that there is not a long wait to get an appointment.  I told her to ask that office if they have an office closer to them and she said she already did that and there is no other office in the area, they work out of [place].    I explained the importance of establishing care with a PCP locally.  I told her that there is a family medicine doctor listed on the AVS that is in [place] - Mackey Ny, MD and I encouraged her to contact the office to schedule an appointment for her husband to establish care. I provided her with his office number: [phone] and she said she will call.  I told her that I will contact her if I obtain any additional information about transportation resources

## 2024-06-13 ENCOUNTER — Other Ambulatory Visit: Payer: Self-pay | Admitting: Nurse Practitioner

## 2024-06-13 ENCOUNTER — Other Ambulatory Visit: Payer: Self-pay

## 2024-06-13 ENCOUNTER — Encounter: Payer: Self-pay | Admitting: Nurse Practitioner

## 2024-06-13 ENCOUNTER — Other Ambulatory Visit (HOSPITAL_COMMUNITY): Payer: Self-pay

## 2024-06-13 DIAGNOSIS — S14101D Unspecified injury at C1 level of cervical spinal cord, subsequent encounter: Secondary | ICD-10-CM

## 2024-06-13 DIAGNOSIS — Z299 Encounter for prophylactic measures, unspecified: Secondary | ICD-10-CM

## 2024-06-13 MED ORDER — XARELTO 10 MG PO TABS
10.0000 mg | ORAL_TABLET | Freq: Every day | ORAL | 2 refills | Status: DC
  Filled 2024-06-13 (×3): qty 30, 30d supply, fill #0

## 2024-06-13 NOTE — Progress Notes (Signed)
   Patient took last dosage of Eliquis today. He is still waiting on Medicaid application and Pharmacy was unable to dispense anymore Eliquis at this time   Reached out to [facility] pharmacy for substitution options  Patient can be switched to Xarelto with [facility] pharmacy assistance while awaiting insurance   Eliquis stopped today as it ran out today  1. Encounter for deep vein thrombosis (DVT) prophylaxis (Primary)  - rivaroxaban (XARELTO) 10 MG TABS tablet; Take 1 tablet (10 mg total) by mouth daily.  Dispense: 30 tablet; Refill: 2  2. Spinal cord injury at C1-C4 level, subsequent encounter (HCC)  - rivaroxaban (XARELTO) 10 MG TABS tablet; Take 1 tablet (10 mg total) by mouth daily.  Dispense: 30 tablet; Refill: 2

## 2024-06-14 ENCOUNTER — Telehealth: Payer: Self-pay

## 2024-06-14 ENCOUNTER — Other Ambulatory Visit: Payer: Self-pay

## 2024-06-14 MED ORDER — APIXABAN 5 MG PO TABS
ORAL_TABLET | ORAL | 0 refills | Status: DC
  Filled 2024-06-14: qty 60, 60d supply, fill #0

## 2024-06-14 NOTE — Progress Notes (Signed)
   Telephone encounter was:  Successful.  Complex Care Management Note Care Guide Note  06/14/2024 Name: Yecheskel Serque Casavant MRN: [id number] DOB: [birth year]  Nihal Eulalio Meeker is a 43 y.o. year old male who is a primary care patient of Kennyth Domino, FNP . The community resource team was consulted for assistance with Transportation Needs   SDOH screenings and interventions completed:  Yes  Social Drivers of Health From This Encounter   Transportation Needs: Unmet Transportation Needs (06/14/2024)   PRAPARE - Administrator, Civil Service (Medical): Yes    Lack of Transportation (Non-Medical): Yes    SDOH Interventions Today    Flowsheet Row Most Recent Value  SDOH Interventions   Transportation Interventions Community Resources Provided     Care guide performed the following interventions: Patient provided with information about care guide support team and interviewed to confirm resource needs. Pt requested information for Transportation Assistance in [place].   Follow Up Plan:  No further follow up planned at this time. The patient has been provided with needed resources.  Encounter Outcome:  Patient Visit Completed    [facility]  [facility] Guide, Phone: [phone] Fax: [phone] Website: [email or web address]

## 2024-06-20 ENCOUNTER — Other Ambulatory Visit: Payer: Self-pay

## 2024-06-20 ENCOUNTER — Other Ambulatory Visit: Payer: Self-pay | Admitting: *Deleted

## 2024-06-20 DIAGNOSIS — S14101A Unspecified injury at C1 level of cervical spinal cord, initial encounter: Secondary | ICD-10-CM
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
## 2024-06-20 NOTE — Patient Instructions (Signed)
 Visit Information  Thank you for taking time to visit with me today. Please don't hesitate to contact me if I can be of assistance to you before our next scheduled appointment.  Your next care management appointment is by telephone on 07/16/24  at 11 am   Please call the care guide team at [phone] if you need to cancel, schedule, or reschedule an appointment.   Please call the Suicide and Crisis Lifeline: 988 call the [place]  National Suicide Prevention Lifeline: 737-778-0948 or TTY: 878-521-4996 TTY (984)871-3804) to talk to a trained counselor call 1-800-273-TALK (toll free, 24 hour hotline) go to [facility] [address], [place] [phone]) call the [facility] Crisis Line: 603-103-2474 call 911 if you are experiencing a Mental Health or Behavioral Health Crisis or need someone to talk to.  Burnadette Baskett, RN, BSN, Theatre Manager [employer] [phone]

## 2024-06-24 ENCOUNTER — Other Ambulatory Visit: Payer: Self-pay | Admitting: Nurse Practitioner

## 2024-06-24 ENCOUNTER — Other Ambulatory Visit: Payer: Self-pay

## 2024-06-24 DIAGNOSIS — S14101D Unspecified injury at C1 level of cervical spinal cord, subsequent encounter: Secondary | ICD-10-CM

## 2024-06-24 MED ORDER — TIZANIDINE HCL 4 MG PO TABS
4.0000 mg | ORAL_TABLET | Freq: Three times a day (TID) | ORAL | 0 refills | Status: DC
  Filled 2024-06-24 – 2024-06-26 (×3): qty 120, 40d supply, fill #0

## 2024-06-25 ENCOUNTER — Other Ambulatory Visit (HOSPITAL_COMMUNITY): Payer: Self-pay

## 2024-06-25 ENCOUNTER — Other Ambulatory Visit: Payer: Self-pay

## 2024-06-26 ENCOUNTER — Other Ambulatory Visit: Payer: Self-pay

## 2024-06-27 ENCOUNTER — Encounter: Payer: Self-pay | Admitting: Physician Assistant

## 2024-06-27 ENCOUNTER — Other Ambulatory Visit (HOSPITAL_COMMUNITY): Payer: Self-pay

## 2024-06-28 ENCOUNTER — Other Ambulatory Visit: Payer: Self-pay

## 2024-06-28 NOTE — Patient Outreach (Signed)
 Social Drivers of Health  Community Resource and Care Coordination Visit Note   06/28/2024  Name: Tyrone Mckinney MRN: [id number] DOB:01-16-1981  Situation: Referral received for [facility] needs assessment and assistance related to [employer] . I obtained verbal consent from Patient.  Visit completed with Patient on the phone.   Background:   SDOH Interventions Today    Flowsheet Row Most Recent Value  SDOH Interventions   Food Insecurity Interventions Intervention Not Indicated  Housing Interventions Other (Comment)  [BSW will look into rental assistance resources for patient]  Transportation Interventions Community Resources Provided  [patient needs specialized transportation to move around.  Pt wife is working on getting transportation through [employer] while she waits for medicaid to be approved.]  Utilities Interventions [facility] Provided  the [employer] paid in full light bill last month. Patient is working on paying the water bill next.]  Financial Strain Interventions Intervention Not Indicated     Assessment:   Goals Addressed             This Visit's Progress    BSW Goal       Current SDOH Barriers:  Transportation Utility/rental assistance Pending medicaid Disability  Interventions: Patient interviewed and appropriate screenings performed Referred patient to community resources  Discussed plans with patient for ongoing follow up and provided patient with direct contact number BSW will f/u with DSS medicaid case worker about pending medicaid. Caseworker: [name] [phone] BSW will provide pt resources for rental/utility assistance in [place].           Recommendation:   attend all scheduled provider appointments F/u on mediciad application.   Follow Up Plan:   Telephone follow up appointment date/time:  07/09/2024 at 11AM.  Laymon Doll, BSW /VBCI - [facility] Social  Worker [phone]

## 2024-06-28 NOTE — Patient Instructions (Signed)
 Visit Information  Thank you for taking time to visit with me today. Please don't hesitate to contact me if I can be of assistance to you before our next scheduled appointment.  Our next appointment is by telephone on 07/09/2024 at 11AM Please call the care guide team at [phone] if you need to cancel or reschedule your appointment.   Following is a copy of your care plan:   Goals Addressed             This Visit's Progress    BSW Goal       Current SDOH Barriers:  Transportation Utility/rental assistance Pending medicaid Disability  Interventions: Patient interviewed and appropriate screenings performed Referred patient to community resources  Discussed plans with patient for ongoing follow up and provided patient with direct contact number BSW will f/u with DSS medicaid case worker about pending medicaid. Caseworker: [name] [phone] BSW will provide pt resources for rental/utility assistance in [place].           Please call the Suicide and Crisis Lifeline: 988 go to [facility] Urgent [facility] [address], [place] [phone]) call 911 if you are experiencing a Mental Health or Behavioral Health Crisis or need someone to talk to.  Patient verbalized understanding of Care plan and visit instructions communicated this visit  Tyrone Mckinney, [place]/VBCI - [facility] Social Worker [phone]

## 2024-07-03 ENCOUNTER — Encounter: Payer: Self-pay | Admitting: Nurse Practitioner

## 2024-07-06 ENCOUNTER — Other Ambulatory Visit: Payer: Self-pay | Admitting: Nurse Practitioner

## 2024-07-06 ENCOUNTER — Other Ambulatory Visit: Payer: Self-pay

## 2024-07-06 DIAGNOSIS — K592 Neurogenic bowel, not elsewhere classified: Secondary | ICD-10-CM

## 2024-07-06 DIAGNOSIS — R252 Cramp and spasm: Secondary | ICD-10-CM

## 2024-07-06 DIAGNOSIS — R238 Other skin changes: Secondary | ICD-10-CM

## 2024-07-06 DIAGNOSIS — S14101D Unspecified injury at C1 level of cervical spinal cord, subsequent encounter: Secondary | ICD-10-CM

## 2024-07-06 DIAGNOSIS — I951 Orthostatic hypotension: Secondary | ICD-10-CM

## 2024-07-07 ENCOUNTER — Other Ambulatory Visit: Payer: Self-pay

## 2024-07-08 ENCOUNTER — Other Ambulatory Visit: Payer: Self-pay

## 2024-07-08 ENCOUNTER — Other Ambulatory Visit: Payer: Self-pay | Admitting: Nurse Practitioner

## 2024-07-08 DIAGNOSIS — S14101D Unspecified injury at C1 level of cervical spinal cord, subsequent encounter: Secondary | ICD-10-CM

## 2024-07-08 DIAGNOSIS — R238 Other skin changes: Secondary | ICD-10-CM

## 2024-07-08 DIAGNOSIS — R252 Cramp and spasm: Secondary | ICD-10-CM

## 2024-07-08 DIAGNOSIS — I951 Orthostatic hypotension: Secondary | ICD-10-CM

## 2024-07-08 DIAGNOSIS — K592 Neurogenic bowel, not elsewhere classified: Secondary | ICD-10-CM

## 2024-07-08 MED ORDER — DULOXETINE HCL 60 MG PO CPEP
60.0000 mg | ORAL_CAPSULE | Freq: Every day | ORAL | 0 refills | Status: DC
  Filled 2024-07-08: qty 30, 30d supply, fill #0

## 2024-07-08 MED ORDER — ATOMOXETINE HCL 25 MG PO CAPS
25.0000 mg | ORAL_CAPSULE | Freq: Every day | ORAL | 0 refills | Status: DC
  Filled 2024-07-08: qty 30, 30d supply, fill #0

## 2024-07-08 MED ORDER — SENNOSIDES-DOCUSATE SODIUM 8.6-50 MG PO TABS
3.0000 | ORAL_TABLET | Freq: Every day | ORAL | 0 refills | Status: DC
  Filled 2024-07-08: qty 90, 30d supply, fill #0

## 2024-07-08 MED ORDER — METHOCARBAMOL 500 MG PO TABS
1000.0000 mg | ORAL_TABLET | Freq: Three times a day (TID) | ORAL | 0 refills | Status: DC | PRN
  Filled 2024-07-08: qty 240, 40d supply, fill #0

## 2024-07-08 MED ORDER — ASCORBIC ACID 500 MG PO TABS
1000.0000 mg | ORAL_TABLET | Freq: Every day | ORAL | 0 refills | Status: DC
  Filled 2024-07-08: qty 60, 30d supply, fill #0

## 2024-07-08 MED ORDER — TIZANIDINE HCL 4 MG PO TABS
4.0000 mg | ORAL_TABLET | Freq: Three times a day (TID) | ORAL | 0 refills | Status: AC
  Filled 2024-07-08 – 2024-07-31 (×3): qty 120, 40d supply, fill #0
  Filled 2024-08-06: qty 90, 30d supply, fill #0
  Filled 2024-08-30: qty 30, 10d supply, fill #1

## 2024-07-08 MED ORDER — GABAPENTIN 300 MG PO CAPS
600.0000 mg | ORAL_CAPSULE | Freq: Three times a day (TID) | ORAL | 0 refills | Status: DC
  Filled 2024-07-08 – 2024-07-11 (×3): qty 180, 30d supply, fill #0

## 2024-07-08 MED ORDER — MIDODRINE HCL 10 MG PO TABS
20.0000 mg | ORAL_TABLET | Freq: Three times a day (TID) | ORAL | 0 refills | Status: DC
  Filled 2024-07-08 (×2): qty 180, 30d supply, fill #0

## 2024-07-08 MED ORDER — ZINC SULFATE 220 (50 ZN) MG PO TABS
220.0000 mg | ORAL_TABLET | Freq: Every day | ORAL | 0 refills | Status: DC
  Filled 2024-07-08: qty 30, 30d supply, fill #0

## 2024-07-08 MED ORDER — ADULT MULTIVITAMIN W/MINERALS CH
1.0000 | ORAL_TABLET | Freq: Every day | ORAL | 0 refills | Status: DC
  Filled 2024-07-08: qty 30, 30d supply, fill #0

## 2024-07-08 MED ORDER — HYDROCERIN EX CREA
1.0000 | TOPICAL_CREAM | Freq: Every day | CUTANEOUS | 0 refills | Status: AC
  Filled 2024-07-08 – 2024-07-31 (×3): qty 454, 113d supply, fill #0

## 2024-07-08 MED ORDER — BACLOFEN 10 MG PO TABS
10.0000 mg | ORAL_TABLET | Freq: Three times a day (TID) | ORAL | 0 refills | Status: DC
  Filled 2024-07-08 (×2): qty 90, 30d supply, fill #0

## 2024-07-09 ENCOUNTER — Other Ambulatory Visit: Payer: Self-pay | Admitting: Licensed Clinical Social Worker

## 2024-07-09 ENCOUNTER — Other Ambulatory Visit: Payer: Self-pay

## 2024-07-09 NOTE — Patient Instructions (Signed)
 Visit Information  Thank you for taking time to visit with me today. Please don't hesitate to contact me if I can be of assistance to you before our next scheduled appointment.  Your next care management appointment is by telephone on 07/22/2024 at 1:30pm  Telephone follow up appointment date/time:  07/22/2024 at 1:30pm  Please call the care guide team at [phone] if you need to cancel, schedule, or reschedule an appointment.   Please call the Suicide and Crisis Lifeline: 988 call the [place]  National Suicide Prevention Lifeline: (850)654-9366 or TTY: 2072963540 TTY (770) 642-2204) to talk to a trained counselor call 911 if you are experiencing a Mental Health or Behavioral Health Crisis or need someone to talk to.  Laymon Doll, BSW [place]/VBCI - [employer] Social Worker [phone]

## 2024-07-09 NOTE — Patient Outreach (Signed)
 Social Drivers of Health  Community Resource and Care Coordination Visit Note   07/09/2024  Name: Tyrone Mckinney MRN: [id number] DOB:1981/02/06  Situation: Referral received for [facility] needs assessment and assistance related to [employer] . I obtained verbal consent from Patient.  Visit completed with Patient and wife on the phone.   Background:   SDOH Interventions Today    Flowsheet Row Most Recent Value  SDOH Interventions   Food Insecurity Interventions Intervention Not Indicated  [patient and wife recently recieved food through a local organization. Pt wife states she is ok with food right now.]  Housing Interventions Community Resources Provided  [BSW will look into rental assistance resources for patient]  Transportation Interventions Community Resources Provided  [patient is waiting to be approved for Medicaid. Pt's wife states he got a letter from [employer] stating he is approved for transportation through them but only in [facility] until his medicaid app can get approved.]  Utilities Interventions [facility] Provided  [employer] company is working patient to avoid disconnection. Pt wife was able to pay half and was given time to pay the other half of the bill.]  Financial Strain Interventions Intervention Not Indicated  [patient's wife states medicaid case worker told them patient's disability was approved.]     Assessment:   Goals Addressed             This Visit's Progress    BSW Goal   On track    Current SDOH Barriers:  Transportation Utility/rental assistance Pending medicaid Disability  Interventions: Patient interviewed and appropriate screenings performed Referred patient to community resources  Discussed plans with patient for ongoing follow up and provided patient with direct contact number BSW will f/u with DSS medicaid case worker about pending medicaid. Caseworker: [name] [phone] BSW will  provide pt resources for rental/utility assistance in [place].           Recommendation:   attend all scheduled provider appointments call and/or follow up with food resources provided via email for food assistance  Follow Up Plan:   Telephone follow up appointment date/time:  07/22/2024 at 1:30pm  Laymon Doll, BSW [place]/VBCI - [facility] Social Worker [phone]

## 2024-07-09 NOTE — Patient Outreach (Signed)
 LCSW introduced self and explained role in Complex Care Management. Spoke with pt's spouse, who shared that their biggest concern was transportation to medical appts. Family are currently working with BSW, to assist noting upcoming scheduled appt is on 11/25 at 11 AM. Pt has been recently approved for [employer] to assist with transportation within [facility]. Medicaid application is pending.   LCSW inquired about any caregiver stress, depression, anxiety symptoms or concerns, which spouse denied. Healthy coping skills to assist with adjusting with chronic health conditions discussed. LCSW reminded spouse of upcoming medical appts.   Spouse agreed to inform CCM team and/or PCP if new needs arise.

## 2024-07-12 ENCOUNTER — Other Ambulatory Visit: Payer: Self-pay

## 2024-07-16 ENCOUNTER — Other Ambulatory Visit: Payer: Self-pay | Admitting: *Deleted

## 2024-07-16 ENCOUNTER — Other Ambulatory Visit: Payer: Self-pay
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
## 2024-07-16 NOTE — Patient Instructions (Signed)
 Visit Information  Thank you for taking time to visit with me today. Please don't hesitate to contact me if I can be of assistance to you before our next scheduled appointment.  Your next care management appointment is by telephone on 09/03/24  at 1:30 pm   Please call the care guide team at [phone] if you need to cancel, schedule, or reschedule an appointment.   Please call the Suicide and Crisis Lifeline: 988 call the [place]  National Suicide Prevention Lifeline: 504-619-7383 or TTY: 713 201 5722 TTY 507 166 7007) to talk to a trained counselor call 1-800-273-TALK (toll free, 24 hour hotline) go to [facility] [address] [place] [phone]) call 911 if you are experiencing a Mental Health or Behavioral Health Crisis or need someone to talk to.  Dayra Rapley, RN, BSN, Theatre Manager [employer] [phone]

## 2024-07-22 ENCOUNTER — Other Ambulatory Visit: Payer: Self-pay

## 2024-07-22 NOTE — Patient Outreach (Signed)
 Social Drivers of Health  Community Resource and Care Coordination Visit Note   07/22/2024  Name: Tyrone Mckinney MRN: [id number] DOB:03/08/1981  Situation: Referral received for [facility] needs assessment and assistance related to [employer] . I obtained verbal consent from patient's wife..  Visit completed with patient's wife on the phone.   Background:   SDOH Interventions Today    Flowsheet Row Most Recent Value  SDOH Interventions   Financial Strain Interventions Community Resources Provided  [spouse was able to receive plenty of adult incontinence from eldercare. Will be calling [employer] to obtain additional support. [place] informed [place] if she has an evcition notice.]     Assessment:   Goals Addressed             This Visit's Progress    BSW Goal   On track    Current SDOH Barriers:  Transportation Utility/rental assistance Pending medicaid Disability  Interventions: Patient interviewed and appropriate screenings performed Referred patient to community resources  Discussed plans with patient for ongoing follow up and provided patient with direct contact number BSW will f/u with DSS medicaid case worker about pending medicaid. Caseworker: [name] [phone] BSW will provide pt resources for rental/utility assistance in [place].  07/22/2024 BSW and patient wife called DSS to speak with caseworker Aliene randye perking application. Unable to reach caseworker and VM was left requesting call back. BSW will continue outreach efforts.         Recommendation:   attend all scheduled provider appointments F/u with caseworker on pending medicaid application.   Follow Up Plan:   Telephone follow up appointment date/time:  08/05/2024 at 2:30pm  Tyrone Mckinney, BSW [place]/VBCI - [facility] Social Worker [phone]

## 2024-07-22 NOTE — Patient Instructions (Signed)
 Visit Information  Thank you for taking time to visit with me today. Please don't hesitate to contact me if I can be of assistance to you before our next scheduled appointment.  Your next care management appointment is by telephone on 08/05/2024 at 2:30pm  Telephone follow up appointment date/time:  08/05/2024 at 2:30pm  Please call the care guide team at [phone] if you need to cancel, schedule, or reschedule an appointment.   Please call the Suicide and Crisis Lifeline: 988 go to [facility] Urgent [facility] [address], [place] [phone]) call 911 if you are experiencing a Mental Health or Behavioral Health Crisis or need someone to talk to.  Laymon Doll, BSW [place]/VBCI - [employer] Social Worker [phone]

## 2024-07-24 ENCOUNTER — Encounter: Payer: Self-pay | Admitting: Internal Medicine

## 2024-07-25 ENCOUNTER — Encounter: Payer: Self-pay | Admitting: Internal Medicine

## 2024-07-29 ENCOUNTER — Ambulatory Visit (INDEPENDENT_AMBULATORY_CARE_PROVIDER_SITE_OTHER): Payer: Self-pay | Admitting: Primary Care

## 2024-07-31 ENCOUNTER — Encounter: Payer: Self-pay | Admitting: Nurse Practitioner

## 2024-07-31 ENCOUNTER — Other Ambulatory Visit: Payer: Self-pay

## 2024-07-31 ENCOUNTER — Other Ambulatory Visit: Payer: Self-pay | Admitting: Nurse Practitioner

## 2024-07-31 DIAGNOSIS — K592 Neurogenic bowel, not elsewhere classified: Secondary | ICD-10-CM

## 2024-07-31 DIAGNOSIS — R238 Other skin changes: Secondary | ICD-10-CM

## 2024-07-31 DIAGNOSIS — S14101D Unspecified injury at C1 level of cervical spinal cord, subsequent encounter: Secondary | ICD-10-CM

## 2024-07-31 DIAGNOSIS — E440 Moderate protein-calorie malnutrition: Secondary | ICD-10-CM

## 2024-07-31 DIAGNOSIS — I951 Orthostatic hypotension: Secondary | ICD-10-CM

## 2024-07-31 DIAGNOSIS — R252 Cramp and spasm: Secondary | ICD-10-CM

## 2024-07-31 DIAGNOSIS — G8220 Paraplegia, unspecified: Secondary | ICD-10-CM

## 2024-07-31 MED ORDER — ATOMOXETINE HCL 25 MG PO CAPS
25.0000 mg | ORAL_CAPSULE | Freq: Every day | ORAL | 0 refills | Status: DC
  Filled 2024-07-31: qty 30, 30d supply, fill #0

## 2024-07-31 MED ORDER — MIDODRINE HCL 10 MG PO TABS
20.0000 mg | ORAL_TABLET | Freq: Three times a day (TID) | ORAL | 0 refills | Status: DC
  Filled 2024-07-31 – 2024-08-06 (×2): qty 180, 30d supply, fill #0

## 2024-07-31 MED ORDER — GABAPENTIN 300 MG PO CAPS
600.0000 mg | ORAL_CAPSULE | Freq: Three times a day (TID) | ORAL | 0 refills | Status: DC
  Filled 2024-07-31 – 2024-08-11 (×3): qty 180, 30d supply, fill #0

## 2024-07-31 MED ORDER — ASCORBIC ACID 500 MG PO TABS
1000.0000 mg | ORAL_TABLET | Freq: Every day | ORAL | 0 refills | Status: DC
  Filled 2024-07-31: qty 60, 30d supply, fill #0

## 2024-07-31 MED ORDER — DULOXETINE HCL 60 MG PO CPEP
60.0000 mg | ORAL_CAPSULE | Freq: Every day | ORAL | 0 refills | Status: DC
  Filled 2024-07-31: qty 30, 30d supply, fill #0

## 2024-07-31 MED ORDER — DICLOFENAC SODIUM 1 % EX GEL
4.0000 g | Freq: Four times a day (QID) | CUTANEOUS | 0 refills | Status: AC
  Filled 2024-07-31: qty 400, 28d supply, fill #0

## 2024-07-31 MED ORDER — ADULT MULTIVITAMIN W/MINERALS CH
1.0000 | ORAL_TABLET | Freq: Every day | ORAL | 0 refills | Status: DC
  Filled 2024-07-31: qty 30, 30d supply, fill #0

## 2024-07-31 MED ORDER — SENNOSIDES-DOCUSATE SODIUM 8.6-50 MG PO TABS
3.0000 | ORAL_TABLET | Freq: Every day | ORAL | 0 refills | Status: DC
  Filled 2024-07-31: qty 90, 30d supply, fill #0

## 2024-07-31 MED ORDER — BACLOFEN 10 MG PO TABS
10.0000 mg | ORAL_TABLET | Freq: Three times a day (TID) | ORAL | 0 refills | Status: DC
  Filled 2024-07-31 – 2024-08-06 (×2): qty 90, 30d supply, fill #0

## 2024-07-31 MED ORDER — ZINC SULFATE 220 (50 ZN) MG PO TABS
220.0000 mg | ORAL_TABLET | Freq: Every day | ORAL | 0 refills | Status: DC
  Filled 2024-07-31: qty 30, 30d supply, fill #0

## 2024-07-31 MED ORDER — METHOCARBAMOL 500 MG PO TABS
1000.0000 mg | ORAL_TABLET | Freq: Three times a day (TID) | ORAL | 0 refills | Status: AC | PRN
  Filled 2024-07-31 – 2024-08-06 (×2): qty 240, 40d supply, fill #0
  Filled 2024-08-30: qty 180, 30d supply, fill #0

## 2024-08-05 ENCOUNTER — Other Ambulatory Visit: Payer: Self-pay

## 2024-08-05 NOTE — Patient Instructions (Signed)
 Tyrone Mckinney - I am sorry I was unable to reach you today for our scheduled appointment. I work with Kennyth Domino, FNP and am calling to support your healthcare needs. Please contact me at [phone] at your earliest convenience. I look forward to speaking with you soon.   Thank you,   Laymon Doll, BSW [place]/VBCI - [facility] Social Worker [phone]

## 2024-08-06 ENCOUNTER — Other Ambulatory Visit: Payer: Self-pay | Admitting: Nurse Practitioner

## 2024-08-06 ENCOUNTER — Other Ambulatory Visit: Payer: Self-pay

## 2024-08-06 ENCOUNTER — Telehealth: Payer: Self-pay

## 2024-08-06 ENCOUNTER — Other Ambulatory Visit (HOSPITAL_COMMUNITY): Payer: Self-pay

## 2024-08-06 ENCOUNTER — Telehealth: Payer: Self-pay | Admitting: Nurse Practitioner

## 2024-08-06 DIAGNOSIS — G8220 Paraplegia, unspecified: Secondary | ICD-10-CM

## 2024-08-06 DIAGNOSIS — N3942 Incontinence without sensory awareness: Secondary | ICD-10-CM

## 2024-08-06 DIAGNOSIS — I951 Orthostatic hypotension: Secondary | ICD-10-CM

## 2024-08-06 DIAGNOSIS — K592 Neurogenic bowel, not elsewhere classified: Secondary | ICD-10-CM

## 2024-08-06 DIAGNOSIS — S14101D Unspecified injury at C1 level of cervical spinal cord, subsequent encounter: Secondary | ICD-10-CM

## 2024-08-06 DIAGNOSIS — E440 Moderate protein-calorie malnutrition: Secondary | ICD-10-CM

## 2024-08-06 DIAGNOSIS — R238 Other skin changes: Secondary | ICD-10-CM

## 2024-08-06 MED ORDER — APIXABAN 5 MG PO TABS
ORAL_TABLET | ORAL | 0 refills | Status: AC
  Filled 2024-08-06: qty 60, fill #0

## 2024-08-06 MED ORDER — INCONTINENCE SUPPLIES KIT: 1.0000 | PACK | Freq: Every day | 0 refills | Status: AC

## 2024-08-06 MED ORDER — ENSURE PLUS HIGH PROTEIN PO LIQD: 237.0000 mL | Freq: Two times a day (BID) | ORAL | 11 refills | Status: DC

## 2024-08-06 MED ORDER — ENSURE PLUS HIGH PROTEIN PO LIQD: 237.0000 mL | Freq: Two times a day (BID) | ORAL | 11 refills | Status: AC

## 2024-08-06 MED ORDER — MIDODRINE HCL 10 MG PO TABS
10.0000 mg | ORAL_TABLET | Freq: Three times a day (TID) | ORAL | 0 refills | Status: AC
  Filled 2024-08-06: qty 90, 30d supply, fill #0
  Filled 2024-08-30: qty 90, 30d supply, fill #1

## 2024-08-06 NOTE — Telephone Encounter (Signed)
 Incontinence supplies request sent to [facility]

## 2024-08-06 NOTE — Progress Notes (Signed)
 Spoke with patient/wife regarding updates to insurance. He now has Medicaid and can get transportation in [place].    Will restart referrals and assist with incontinence supplies and feeding supplements requested   Patient will transition to [facility]   1. Orthostatic hypotension

## 2024-08-06 NOTE — Telephone Encounter (Signed)
 Discussed medication changes with Medicaid and referrals back to Neurosurgery now that patient has medicaid.   Wife is also requesting incontinence supplies from [facility] urology

## 2024-08-07 ENCOUNTER — Other Ambulatory Visit: Payer: Self-pay

## 2024-08-11 ENCOUNTER — Other Ambulatory Visit: Payer: Self-pay

## 2024-08-22 ENCOUNTER — Other Ambulatory Visit: Payer: Self-pay

## 2024-08-22 ENCOUNTER — Encounter: Payer: Self-pay | Admitting: Nurse Practitioner

## 2024-08-22 NOTE — Patient Instructions (Signed)
 Visit Information  Mr. Tyrone Mckinney was given information about Medicaid Managed Care team care coordination services as a part of their [facility] Community Plan Medicaid benefit.   If you would like to schedule transportation through your [facility], please call the following number at least 2 days in advance of your appointment: [phone]   Rides for urgent appointments can also be made after hours by calling [employer].  Call the Behavioral Health Crisis Line at [phone], at any time, 24 hours a day, 7 days a week. If you are in danger or need immediate medical attention call 911.   Care plan and visit instructions communicated with the patient verbally today. Patient agrees to receive a copy in MyChart. Active MyChart status and patient understanding of how to access instructions and care plan via MyChart confirmed with patient.     No further follow up required: patient requested no further follow up appointments.  Tyrone Mckinney, BSW [place]/VBCI - [employer] Social Worker [phone]   Following is a copy of your plan of care:   Goals Addressed             This Visit's Progress    COMPLETED: BSW Goal       Current SDOH Barriers:  Transportation Utility/rental assistance Pending medicaid Disability  Interventions: Patient interviewed and appropriate screenings performed Referred patient to community resources  Discussed plans with patient for ongoing follow up and provided patient with direct contact number BSW will f/u with DSS medicaid case worker about pending medicaid. Caseworker: Tyrone Mckinney [phone] BSW will provide pt resources for rental/utility assistance in [place].  07/22/2024 BSW and patient wife called DSS to speak with caseworker Tyrone randye perking application. Unable to reach caseworker and VM was left requesting call back. BSW will continue outreach efforts.  08/22/2024 Pt was approved  for Medicaid with [employer]. BSW informed patient of benefits with Medicaid and provided information on transportation.  BSW will also provide pt information on PCP providers as patient and family are moving to [place], [place].  BSW encouraged patient to reach out to PCP regarding setting up referrals to [place].  BSW advised patient to call local DSS office to inform them of change of address and request case be transferred to local DSS office in [facility]. No further follow up appointments were scheduled with pt. Pt was advised on how to get back on BSW schedule.

## 2024-08-22 NOTE — Patient Outreach (Signed)
 Social Drivers of Health  Community Resource and Care Coordination Visit Note   08/22/2024  Name: Tyrone Mckinney MRN: [id number] DOB:09-18-1980  Situation: Referral received for [facility] needs assessment and assistance related to [employer] . I obtained verbal consent from Patient.  Visit completed with Patient on the phone.   Background:   SDOH Interventions Today    Flowsheet Row Most Recent Value  SDOH Interventions   Food Insecurity Interventions Intervention Not Indicated  [patient states they are ok with food at this time.]  Housing Interventions Other (Comment)  [Patient and family are in the process of moving to [place], [place]]  Transportation Interventions Patient Resources (Friends/Family), Payor Benefit  [Patient has been approved for Medicaid. BSW shared transportation information via email for [employer].]     Assessment:   Goals Addressed             This Visit's Progress    COMPLETED: BSW Goal       Current SDOH Barriers:  Transportation Utility/rental assistance Pending medicaid Disability  Interventions: Patient interviewed and appropriate screenings performed Referred patient to community resources  Discussed plans with patient for ongoing follow up and provided patient with direct contact number BSW will f/u with DSS medicaid case worker about pending medicaid. Caseworker: Tyrone Mckinney [phone] BSW will provide pt resources for rental/utility assistance in [place].  07/22/2024 BSW and patient wife called DSS to speak with caseworker Tyrone Mckinney application. Unable to reach caseworker and VM was left requesting call back. BSW will continue outreach efforts.  08/22/2024 Pt was approved for Medicaid with [employer]. BSW informed patient of benefits with Medicaid and provided information on transportation.  BSW will also provide pt information on PCP providers as patient and family are moving to [place],  [place].  BSW encouraged patient to reach out to PCP regarding setting up referrals to continue care [place] providers [place].  BSW advised patient to call local DSS office to inform them of change of address and request case be transferred to local DSS office in [facility]. No further follow up appointments were scheduled with pt. Pt was advised on how to get back on BSW schedule.         Recommendation:   attend all scheduled provider appointments call for transportation assistance at least one week before appointments ask for help if you don't understand your health insurance benefits F/u with [facility] DSS regarding upcoming address change and transfer of case for medicaid.   Follow Up Plan:   Patient declines further calls or assistance. [employer] will be closed. Patient has been provided contact information should new needs arise.   Tyrone Mckinney, BSW [place]/VBCI - [facility] Social Worker [phone]

## 2024-08-30 ENCOUNTER — Other Ambulatory Visit: Payer: Self-pay

## 2024-08-30 ENCOUNTER — Other Ambulatory Visit: Payer: Self-pay | Admitting: Nurse Practitioner

## 2024-08-30 DIAGNOSIS — S14101D Unspecified injury at C1 level of cervical spinal cord, subsequent encounter: Secondary | ICD-10-CM

## 2024-08-30 DIAGNOSIS — R252 Cramp and spasm: Secondary | ICD-10-CM

## 2024-08-30 DIAGNOSIS — K592 Neurogenic bowel, not elsewhere classified: Secondary | ICD-10-CM

## 2024-08-30 NOTE — Progress Notes (Signed)
 The patient presented for a primary care visit on 06/05/24 to establish care. Blood pressure screening wasn't conducted. During the appointment, the patient wasn't screened for (SDOH).   A review of the patient's chart revealed that they do currently have a primary care provider (PCP) the last office visit was 06/05/24. Pt has a future appointment scheduled with a care manager under the vbci department as well as previous visits. At this time, no additional support from the [facility] team is necessary.

## 2024-08-31 ENCOUNTER — Other Ambulatory Visit: Payer: Self-pay

## 2024-08-31 ENCOUNTER — Other Ambulatory Visit: Payer: Self-pay | Admitting: Nurse Practitioner

## 2024-08-31 DIAGNOSIS — R252 Cramp and spasm: Secondary | ICD-10-CM

## 2024-09-01 ENCOUNTER — Other Ambulatory Visit: Payer: Self-pay | Admitting: Nurse Practitioner

## 2024-09-01 DIAGNOSIS — S14101D Unspecified injury at C1 level of cervical spinal cord, subsequent encounter: Secondary | ICD-10-CM

## 2024-09-03 ENCOUNTER — Other Ambulatory Visit: Payer: Self-pay | Admitting: *Deleted

## 2024-09-04 ENCOUNTER — Other Ambulatory Visit: Payer: Self-pay

## 2024-09-04 MED ORDER — ADULT MULTIVITAMIN W/MINERALS CH
1.0000 | ORAL_TABLET | Freq: Every day | ORAL | 0 refills | Status: AC
  Filled 2024-09-04: qty 30, 30d supply, fill #0

## 2024-09-04 MED FILL — Baclofen Tab 10 MG: ORAL | 30 days supply | Qty: 90 | Fill #0 | Status: AC

## 2024-09-04 MED FILL — Gabapentin Cap 300 MG: ORAL | 30 days supply | Qty: 180 | Fill #0 | Status: CN

## 2024-09-04 MED FILL — Sennosides-Docusate Sodium Tab 8.6-50 MG: ORAL | 30 days supply | Qty: 90 | Fill #0 | Status: AC

## 2024-09-04 MED FILL — Atomoxetine HCl Cap 25 MG (Base Equiv): ORAL | 30 days supply | Qty: 30 | Fill #0 | Status: AC

## 2024-09-04 MED FILL — Duloxetine HCl Enteric Coated Pellets Cap 60 MG (Base Eq): ORAL | 30 days supply | Qty: 30 | Fill #0 | Status: AC

## 2024-09-04 MED FILL — Zinc Sulfate Tab 220 MG (50 MG Zinc Equivalent): ORAL | 30 days supply | Qty: 30 | Fill #0 | Status: AC

## 2024-09-04 MED FILL — Ascorbic Acid Tab 500 MG: ORAL | 30 days supply | Qty: 60 | Fill #0 | Status: AC

## 2024-09-04 MED FILL — Apixaban Tab 2.5 MG: ORAL | 30 days supply | Qty: 60 | Fill #0 | Status: AC

## 2024-09-05 ENCOUNTER — Other Ambulatory Visit: Payer: Self-pay
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
## 2024-09-05 NOTE — Patient Instructions (Signed)
 Visit Information  Tyrone Mckinney was given information about Medicaid Managed Care team care coordination services as a part of their [facility] Community Plan Medicaid benefit.   If you would like to schedule transportation through your [facility], please call the following number at least 2 days in advance of your appointment: [phone]   Rides for urgent appointments can also be made after hours by calling Member Services.  Call the Behavioral Health Crisis Line at [phone], at any time, 24 hours a day, 7 days a week. If you are in danger or need immediate medical attention call 911.  Please see education materials related to Spinal cord injury provided by MyChart link.  Care plan and visit instructions communicated with the patient verbally today. Patient agrees to receive a copy in MyChart. Active MyChart status and patient understanding of how to access instructions and care plan via MyChart confirmed with patient.     Telephone follow up appointment with Managed Medicaid care management team member scheduled for: 10/22/24 @ 1:30 pm.  Tyrone Wissner, RN, BSN, ACM RN Care Manager [facility] [phone]   Following is a copy of your plan of care:   Goals Addressed             This Visit's Progress    VBCI RN Care Plan   On track    Problems:  Care Coordination needs related to Depression  , Financial Strain , Housing , and Transportation Chronic Disease Management support and education needs related to Spinal Cord Injury  Goal: Over the next 90  days the Patient will attend all scheduled medical appointments: PCP and Specialist as evidenced by keeping all schedule appointments.          continue to work with Medical Illustrator and/or Social Worker to address care management and care coordination needs related to Spinal Cord Injury as evidenced by adherence to care management team scheduled appointments     take all medications exactly as  prescribed and will call provider for medication related questions as evidenced by compliance.    verbalize basic understanding of Spinal Cord Injury disease process and self health management plan as evidenced by verbal explanation, recognize, monitor symptoms/ life style changes.   work with pharmacist to address Medication procurement related to Spinal Cord injury as evidenced by review of electronic medical record and patient or pharmacist report    work with child psychotherapist to address Financial constraints related to  , Housing barriers, Inability to perform ADL's independently, Lacks knowledge of community resource:  , Lack of essential utilities:  *, and Transportation related to the management of Spinal cord injury as evidenced by review of electronic medical record and patient or social worker report      Interventions:   Evaluation of current treatment plan related to Spinal Cord Injury, Financial constraints related to  , Transportation, Housing barriers, Lack of essential utilities -  *, Inability to perform ADL's independently, and Lacks knowledge of community resource:   self-management and patient's adherence to plan as established by provider. Discussed plans with patient for ongoing care management follow up and provided patient with direct contact information for care management team Evaluation of current treatment plan related to Spinal Cord injury  and patient's adherence to plan as established by provider Provided education to patient and/or caregiver about advanced directives Social Work referral for Housing barriers, ADL resources; financial resources, utility resources and transportation assistance.   Discussed plans with patient for  ongoing care management follow up and provided patient with direct contact information for care management team Screening for signs and symptoms of depression related to chronic disease state  Assessed social determinant of health barriers  Patient  Self-Care Activities:  Attend all scheduled provider appointments Call pharmacy for medication refills 3-7 days in advance of running out of medications Call provider office for new concerns or questions  Take medications as prescribed   Work with the social worker to address care coordination needs and will continue to work with the clinical team to address health care and disease management related needs Work with the pharmacist to address medication management needs and will continue to work with the clinical team to address health care and disease management related needs Follow up on resources provided by Child Psychotherapist Regularly check for redness of sores and take preventative measures, such as using pressure-reducing cushions and mattresses  Check skin daily to catch any potential pressure sores early Eat a healthy, balanced diet  Regularly shift positions Seek assistance when needed.         Attend new PCP appointment to establish care    Plan:  Telephone follow up appointment with care management team member scheduled for:  10/22/24 @ 11 am.
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
## 2024-09-10 ENCOUNTER — Encounter: Payer: Self-pay | Admitting: Physician Assistant

## 2024-10-22 ENCOUNTER — Telehealth: Admitting: *Deleted
